# Patient Record
Sex: Male | Born: 1940 | Race: White | Hispanic: No | Marital: Married | Smoking: Former smoker
Health system: Southern US, Community
[De-identification: ages and names within clinical notes are randomized; demographics above are authoritative.]

## PROBLEM LIST (undated history)

## (undated) DIAGNOSIS — IMO0001 Reserved for inherently not codable concepts without codable children: Secondary | ICD-10-CM

## (undated) DIAGNOSIS — N138 Other obstructive and reflux uropathy: Secondary | ICD-10-CM

## (undated) DIAGNOSIS — C4490 Unspecified malignant neoplasm of skin, unspecified: Secondary | ICD-10-CM

## (undated) DIAGNOSIS — K219 Gastro-esophageal reflux disease without esophagitis: Secondary | ICD-10-CM

## (undated) DIAGNOSIS — M545 Low back pain, unspecified: Secondary | ICD-10-CM

## (undated) DIAGNOSIS — M1990 Unspecified osteoarthritis, unspecified site: Secondary | ICD-10-CM

## (undated) DIAGNOSIS — E785 Hyperlipidemia, unspecified: Secondary | ICD-10-CM

## (undated) DIAGNOSIS — E786 Lipoprotein deficiency: Secondary | ICD-10-CM

## (undated) DIAGNOSIS — I10 Essential (primary) hypertension: Secondary | ICD-10-CM

## (undated) DIAGNOSIS — N401 Enlarged prostate with lower urinary tract symptoms: Secondary | ICD-10-CM

## (undated) DIAGNOSIS — I2510 Atherosclerotic heart disease of native coronary artery without angina pectoris: Secondary | ICD-10-CM

## (undated) DIAGNOSIS — R350 Frequency of micturition: Secondary | ICD-10-CM

## (undated) HISTORY — PX: TONSILLECTOMY: SUR1361

## (undated) HISTORY — DX: Lipoprotein deficiency: E78.6

## (undated) HISTORY — DX: Essential (primary) hypertension: I10

## (undated) HISTORY — DX: Low back pain, unspecified: M54.50

## (undated) HISTORY — PX: CARDIAC CATHETERIZATION: SHX172

## (undated) HISTORY — PX: COLONOSCOPY W/ POLYPECTOMY: SHX1380

## (undated) HISTORY — DX: Hyperlipidemia, unspecified: E78.5

## (undated) HISTORY — DX: Low back pain: M54.5

## (undated) HISTORY — DX: Atherosclerotic heart disease of native coronary artery without angina pectoris: I25.10

## (undated) HISTORY — PX: EYE SURGERY: SHX253

## (undated) HISTORY — PX: BACK SURGERY: SHX140

## (undated) HISTORY — DX: Other obstructive and reflux uropathy: N13.8

## (undated) HISTORY — DX: Gastro-esophageal reflux disease without esophagitis: K21.9

## (undated) HISTORY — DX: Benign prostatic hyperplasia with lower urinary tract symptoms: N40.1

---

## 2000-04-25 ENCOUNTER — Ambulatory Visit (HOSPITAL_COMMUNITY): Admission: RE | Admit: 2000-04-25 | Discharge: 2000-04-25 | Payer: Self-pay | Admitting: Family Medicine

## 2000-04-25 ENCOUNTER — Encounter: Payer: Self-pay | Admitting: Family Medicine

## 2004-12-09 ENCOUNTER — Ambulatory Visit (HOSPITAL_COMMUNITY): Admission: RE | Admit: 2004-12-09 | Discharge: 2004-12-09 | Payer: Self-pay | Admitting: Gastroenterology

## 2004-12-09 ENCOUNTER — Encounter (INDEPENDENT_AMBULATORY_CARE_PROVIDER_SITE_OTHER): Payer: Self-pay | Admitting: Specialist

## 2006-11-28 ENCOUNTER — Ambulatory Visit: Payer: Self-pay | Admitting: Cardiology

## 2006-11-28 ENCOUNTER — Observation Stay (HOSPITAL_COMMUNITY): Admission: AD | Admit: 2006-11-28 | Discharge: 2006-11-30 | Payer: Self-pay | Admitting: Cardiology

## 2006-12-19 ENCOUNTER — Ambulatory Visit: Payer: Self-pay | Admitting: Cardiology

## 2007-01-11 ENCOUNTER — Ambulatory Visit: Payer: Self-pay | Admitting: Cardiology

## 2007-01-11 LAB — CONVERTED CEMR LAB
ALT: 30 units/L (ref 0–40)
AST: 21 units/L (ref 0–37)
Albumin: 4.1 g/dL (ref 3.5–5.2)
Alkaline Phosphatase: 81 units/L (ref 39–117)
BUN: 16 mg/dL (ref 6–23)
Basophils Absolute: 0 10*3/uL (ref 0.0–0.1)
Basophils Relative: 0.5 % (ref 0.0–1.0)
Bilirubin, Direct: 0.2 mg/dL (ref 0.0–0.3)
CO2: 31 meq/L (ref 19–32)
Calcium: 9.2 mg/dL (ref 8.4–10.5)
Chloride: 99 meq/L (ref 96–112)
Cholesterol: 136 mg/dL (ref 0–200)
Creatinine, Ser: 1.2 mg/dL (ref 0.4–1.5)
Direct LDL: 69.9 mg/dL
Eosinophils Absolute: 0 10*3/uL (ref 0.0–0.6)
Eosinophils Relative: 1 % (ref 0.0–5.0)
GFR calc Af Amer: 78 mL/min
GFR calc non Af Amer: 65 mL/min
Glucose, Bld: 103 mg/dL — ABNORMAL HIGH (ref 70–99)
HCT: 44.8 % (ref 39.0–52.0)
HDL: 26.4 mg/dL — ABNORMAL LOW (ref >39.0)
Hemoglobin: 16 g/dL (ref 13.0–17.0)
Lymphocytes Relative: 32.7 % (ref 12.0–46.0)
MCHC: 35.7 g/dL (ref 30.0–36.0)
MCV: 92 fL (ref 78.0–100.0)
Monocytes Absolute: 0.4 10*3/uL (ref 0.2–0.7)
Monocytes Relative: 8 % (ref 3.0–11.0)
Neutro Abs: 2.6 10*3/uL (ref 1.4–7.7)
Neutrophils Relative %: 57.8 % (ref 43.0–77.0)
Platelets: 174 10*3/uL (ref 150–400)
Potassium: 4.4 meq/L (ref 3.5–5.1)
RBC: 4.87 M/uL (ref 4.22–5.81)
RDW: 11.8 % (ref 11.5–14.6)
Sodium: 136 meq/L (ref 135–145)
Total Bilirubin: 1.1 mg/dL (ref 0.3–1.2)
Total CHOL/HDL Ratio: 5.2
Total Protein: 6.7 g/dL (ref 6.0–8.3)
Triglycerides: 205 mg/dL (ref 0–149)
VLDL: 41 mg/dL — ABNORMAL HIGH (ref 0–40)
WBC: 4.5 10*3/uL (ref 4.5–10.5)

## 2008-09-04 ENCOUNTER — Ambulatory Visit: Payer: Self-pay | Admitting: Internal Medicine

## 2008-09-04 DIAGNOSIS — I119 Hypertensive heart disease without heart failure: Secondary | ICD-10-CM

## 2008-09-04 DIAGNOSIS — M545 Low back pain, unspecified: Secondary | ICD-10-CM | POA: Insufficient documentation

## 2008-09-04 DIAGNOSIS — M1990 Unspecified osteoarthritis, unspecified site: Secondary | ICD-10-CM

## 2008-09-04 HISTORY — DX: Hypertensive heart disease without heart failure: I11.9

## 2008-09-04 HISTORY — DX: Unspecified osteoarthritis, unspecified site: M19.90

## 2008-12-19 ENCOUNTER — Ambulatory Visit: Payer: Self-pay | Admitting: Internal Medicine

## 2008-12-19 LAB — CONVERTED CEMR LAB
ALT: 28 units/L (ref 0–53)
AST: 22 units/L (ref 0–37)
Albumin: 4.1 g/dL (ref 3.5–5.2)
Alkaline Phosphatase: 90 units/L (ref 39–117)
BUN: 30 mg/dL — ABNORMAL HIGH (ref 6–23)
Basophils Absolute: 0 10*3/uL (ref 0.0–0.1)
Basophils Relative: 0.2 % (ref 0.0–3.0)
Bilirubin Urine: NEGATIVE
Bilirubin, Direct: 0.2 mg/dL (ref 0.0–0.3)
CO2: 30 meq/L (ref 19–32)
Calcium: 9.1 mg/dL (ref 8.4–10.5)
Chloride: 102 meq/L (ref 96–112)
Cholesterol: 138 mg/dL (ref 0–200)
Creatinine, Ser: 1.2 mg/dL (ref 0.4–1.5)
Eosinophils Absolute: 0.1 10*3/uL (ref 0.0–0.7)
Eosinophils Relative: 1.5 % (ref 0.0–5.0)
GFR calc non Af Amer: 64.14 mL/min (ref >60)
Glucose, Bld: 109 mg/dL — ABNORMAL HIGH (ref 70–99)
HCT: 48 % (ref 39.0–52.0)
HDL: 24.6 mg/dL — ABNORMAL LOW (ref >39.00)
Hemoglobin, Urine: NEGATIVE
Hemoglobin: 17 g/dL (ref 13.0–17.0)
Ketones, ur: NEGATIVE mg/dL
LDL Cholesterol: 81 mg/dL (ref 0–99)
Leukocytes, UA: NEGATIVE
Lymphocytes Relative: 26 % (ref 12.0–46.0)
Lymphs Abs: 1.2 10*3/uL (ref 0.7–4.0)
MCHC: 35.4 g/dL (ref 30.0–36.0)
MCV: 92.8 fL (ref 78.0–100.0)
Monocytes Absolute: 0.4 10*3/uL (ref 0.1–1.0)
Monocytes Relative: 8.1 % (ref 3.0–12.0)
Neutro Abs: 2.9 10*3/uL (ref 1.4–7.7)
Neutrophils Relative %: 64.2 % (ref 43.0–77.0)
Nitrite: NEGATIVE
PSA: 0.67 ng/mL (ref 0.10–4.00)
Platelets: 138 10*3/uL — ABNORMAL LOW (ref 150.0–400.0)
Potassium: 4.4 meq/L (ref 3.5–5.1)
RBC: 5.18 M/uL (ref 4.22–5.81)
RDW: 12.1 % (ref 11.5–14.6)
Sodium: 137 meq/L (ref 135–145)
Specific Gravity, Urine: 1.01 (ref 1.000–1.030)
TSH: 1.26 microintl units/mL (ref 0.35–5.50)
Total Bilirubin: 0.9 mg/dL (ref 0.3–1.2)
Total CHOL/HDL Ratio: 6
Total Protein, Urine: NEGATIVE mg/dL
Total Protein: 7.1 g/dL (ref 6.0–8.3)
Triglycerides: 164 mg/dL — ABNORMAL HIGH (ref 0.0–149.0)
Urine Glucose: NEGATIVE mg/dL
Urobilinogen, UA: 0.2 (ref 0.0–1.0)
VLDL: 32.8 mg/dL (ref 0.0–40.0)
WBC: 4.6 10*3/uL (ref 4.5–10.5)
pH: 7 (ref 5.0–8.0)

## 2008-12-20 ENCOUNTER — Encounter: Payer: Self-pay | Admitting: Internal Medicine

## 2008-12-26 ENCOUNTER — Ambulatory Visit: Payer: Self-pay | Admitting: Internal Medicine

## 2008-12-26 DIAGNOSIS — I2510 Atherosclerotic heart disease of native coronary artery without angina pectoris: Secondary | ICD-10-CM

## 2008-12-26 DIAGNOSIS — E786 Lipoprotein deficiency: Secondary | ICD-10-CM | POA: Insufficient documentation

## 2008-12-26 DIAGNOSIS — N401 Enlarged prostate with lower urinary tract symptoms: Secondary | ICD-10-CM

## 2008-12-26 HISTORY — DX: Atherosclerotic heart disease of native coronary artery without angina pectoris: I25.10

## 2008-12-30 DIAGNOSIS — E785 Hyperlipidemia, unspecified: Secondary | ICD-10-CM | POA: Insufficient documentation

## 2008-12-30 DIAGNOSIS — K219 Gastro-esophageal reflux disease without esophagitis: Secondary | ICD-10-CM

## 2008-12-30 HISTORY — DX: Gastro-esophageal reflux disease without esophagitis: K21.9

## 2008-12-30 HISTORY — DX: Hyperlipidemia, unspecified: E78.5

## 2009-01-22 ENCOUNTER — Encounter (INDEPENDENT_AMBULATORY_CARE_PROVIDER_SITE_OTHER): Payer: Self-pay | Admitting: *Deleted

## 2009-01-27 ENCOUNTER — Ambulatory Visit: Payer: Self-pay | Admitting: Internal Medicine

## 2009-01-27 DIAGNOSIS — E8881 Metabolic syndrome: Secondary | ICD-10-CM

## 2009-01-27 LAB — CONVERTED CEMR LAB
Cholesterol, target level: 200 mg/dL
HDL goal, serum: 40 mg/dL
LDL Goal: 70 mg/dL

## 2009-02-28 ENCOUNTER — Ambulatory Visit: Payer: Self-pay | Admitting: Cardiology

## 2009-03-24 ENCOUNTER — Encounter: Payer: Self-pay | Admitting: Cardiovascular Disease

## 2009-03-24 ENCOUNTER — Ambulatory Visit: Payer: Self-pay

## 2009-06-10 ENCOUNTER — Encounter: Payer: Self-pay | Admitting: Internal Medicine

## 2009-06-10 ENCOUNTER — Ambulatory Visit: Payer: Self-pay | Admitting: Internal Medicine

## 2009-06-19 ENCOUNTER — Ambulatory Visit: Payer: Self-pay | Admitting: Internal Medicine

## 2009-06-20 ENCOUNTER — Encounter: Payer: Self-pay | Admitting: Internal Medicine

## 2009-07-23 ENCOUNTER — Ambulatory Visit: Payer: Self-pay | Admitting: Internal Medicine

## 2009-07-23 DIAGNOSIS — J45901 Unspecified asthma with (acute) exacerbation: Secondary | ICD-10-CM | POA: Insufficient documentation

## 2009-07-23 LAB — CONVERTED CEMR LAB
Cholesterol, target level: 200 mg/dL
HDL goal, serum: 40 mg/dL
LDL Goal: 100 mg/dL

## 2009-08-19 ENCOUNTER — Ambulatory Visit: Payer: Self-pay | Admitting: Internal Medicine

## 2010-01-08 ENCOUNTER — Ambulatory Visit: Payer: Self-pay | Admitting: Internal Medicine

## 2010-01-08 DIAGNOSIS — F528 Other sexual dysfunction not due to a substance or known physiological condition: Secondary | ICD-10-CM

## 2010-01-08 LAB — CONVERTED CEMR LAB
ALT: 25 units/L (ref 0–53)
AST: 25 units/L (ref 0–37)
Albumin: 4.3 g/dL (ref 3.5–5.2)
Alkaline Phosphatase: 87 units/L (ref 39–117)
BUN: 18 mg/dL (ref 6–23)
Basophils Absolute: 0 10*3/uL (ref 0.0–0.1)
Basophils Relative: 0.7 % (ref 0.0–3.0)
Bilirubin Urine: NEGATIVE
Bilirubin, Direct: 0.2 mg/dL (ref 0.0–0.3)
CO2: 26 meq/L (ref 19–32)
Calcium: 9.3 mg/dL (ref 8.4–10.5)
Chloride: 100 meq/L (ref 96–112)
Cholesterol: 102 mg/dL (ref 0–200)
Creatinine, Ser: 1 mg/dL (ref 0.4–1.5)
Eosinophils Absolute: 0 10*3/uL (ref 0.0–0.7)
Eosinophils Relative: 1 % (ref 0.0–5.0)
GFR calc non Af Amer: 76.26 mL/min (ref >60)
Glucose, Bld: 101 mg/dL — ABNORMAL HIGH (ref 70–99)
HCT: 45.7 % (ref 39.0–52.0)
HDL: 27.3 mg/dL — ABNORMAL LOW (ref >39.00)
Hemoglobin, Urine: NEGATIVE
Hemoglobin: 16.1 g/dL (ref 13.0–17.0)
Hgb A1c MFr Bld: 5.7 % (ref 4.6–6.5)
Ketones, ur: NEGATIVE mg/dL
LDL Cholesterol: 45 mg/dL (ref 0–99)
Leukocytes, UA: NEGATIVE
Lymphocytes Relative: 38 % (ref 12.0–46.0)
Lymphs Abs: 1.6 10*3/uL (ref 0.7–4.0)
MCHC: 35.2 g/dL (ref 30.0–36.0)
MCV: 93.6 fL (ref 78.0–100.0)
Monocytes Absolute: 0.3 10*3/uL (ref 0.1–1.0)
Monocytes Relative: 7.7 % (ref 3.0–12.0)
Neutro Abs: 2.3 10*3/uL (ref 1.4–7.7)
Neutrophils Relative %: 52.6 % (ref 43.0–77.0)
Nitrite: NEGATIVE
PSA: 0.79 ng/mL (ref 0.10–4.00)
Platelets: 146 10*3/uL — ABNORMAL LOW (ref 150.0–400.0)
Potassium: 4.3 meq/L (ref 3.5–5.1)
RBC: 4.88 M/uL (ref 4.22–5.81)
RDW: 12.9 % (ref 11.5–14.6)
Sodium: 139 meq/L (ref 135–145)
Specific Gravity, Urine: 1.015 (ref 1.000–1.030)
TSH: 1.62 microintl units/mL (ref 0.35–5.50)
Total Bilirubin: 1.3 mg/dL — ABNORMAL HIGH (ref 0.3–1.2)
Total CHOL/HDL Ratio: 4
Total Protein, Urine: NEGATIVE mg/dL
Total Protein: 6.9 g/dL (ref 6.0–8.3)
Triglycerides: 149 mg/dL (ref 0.0–149.0)
Urine Glucose: NEGATIVE mg/dL
Urobilinogen, UA: 0.2 (ref 0.0–1.0)
VLDL: 29.8 mg/dL (ref 0.0–40.0)
WBC: 4.3 10*3/uL — ABNORMAL LOW (ref 4.5–10.5)
pH: 6.5 (ref 5.0–8.0)

## 2010-01-09 ENCOUNTER — Encounter: Payer: Self-pay | Admitting: Internal Medicine

## 2010-04-06 ENCOUNTER — Ambulatory Visit: Payer: Self-pay | Admitting: Internal Medicine

## 2010-04-06 ENCOUNTER — Encounter: Payer: Self-pay | Admitting: Internal Medicine

## 2010-04-06 ENCOUNTER — Telehealth: Payer: Self-pay | Admitting: Internal Medicine

## 2010-04-06 LAB — CONVERTED CEMR LAB
AST: 26 units/L (ref 0–37)
Albumin: 3.9 g/dL (ref 3.5–5.2)
BUN: 15 mg/dL (ref 6–23)
Basophils Absolute: 0 10*3/uL (ref 0.0–0.1)
Basophils Relative: 0.2 % (ref 0.0–3.0)
Calcium: 9.3 mg/dL (ref 8.4–10.5)
Creatinine, Ser: 1.1 mg/dL (ref 0.4–1.5)
Eosinophils Absolute: 0.1 10*3/uL (ref 0.0–0.7)
GFR calc non Af Amer: 74.53 mL/min (ref >60)
Hemoglobin: 15.3 g/dL (ref 13.0–17.0)
Lymphs Abs: 1.7 10*3/uL (ref 0.7–4.0)
MCHC: 34.9 g/dL (ref 30.0–36.0)
MCV: 94.1 fL (ref 78.0–100.0)
Monocytes Absolute: 0.4 10*3/uL (ref 0.1–1.0)
Neutro Abs: 3.6 10*3/uL (ref 1.4–7.7)
RBC: 4.66 M/uL (ref 4.22–5.81)
RDW: 12.7 % (ref 11.5–14.6)
TSH: 1.54 microintl units/mL (ref 0.35–5.50)
Total Bilirubin: 1 mg/dL (ref 0.3–1.2)

## 2010-06-24 ENCOUNTER — Encounter: Payer: Self-pay | Admitting: Internal Medicine

## 2010-07-08 ENCOUNTER — Telehealth: Payer: Self-pay | Admitting: Internal Medicine

## 2010-09-15 NOTE — Assessment & Plan Note (Signed)
Summary: cough/cold not improving/cd   Vital Signs:  Patient profile:   70 year old male Height:      74 inches Weight:      250.75 pounds BMI:     32.31 O2 Sat:      95 % on Room air Temp:     98.2 degrees F oral Pulse rate:   70 / minute Pulse rhythm:   regular Resp:     16 per minute BP sitting:   138 / 70  (left arm) Cuff size:   large  Vitals Entered By: Rock Nephew CMA (April 06, 2010 10:13 AM)  Nutrition Counseling: Patient's BMI is greater than 25 and therefore counseled on weight management options.  O2 Flow:  Room air CC: follow-up visit, Cough Is Patient Diabetic? No Pain Assessment Patient in pain? no        Primary Care Provider:  Etta Grandchild MD  CC:  follow-up visit and Cough.  History of Present Illness:  Cough      This is a 70 year old man who presents with Cough.  The symptoms began 3 weeks ago.  The intensity is described as moderate.  The patient reports productive cough, but denies pleuritic chest pain, shortness of breath, wheezing, exertional dyspnea, fever, hemoptysis, and malaise.  The patient denies the following symptoms: cold/URI symptoms, sore throat, nasal congestion, chronic rhinitis, weight loss, acid reflux symptoms, and peripheral edema.  The cough is worse with lying down.  Diagnostic testing to date has included CXR and spirometry.    Preventive Screening-Counseling & Management  Alcohol-Tobacco     Alcohol drinks/day: 0     Smoking Status: quit < 6 months     Smoking Cessation Counseling: no     Smoke Cessation Stage: quit     Packs/Day: 0.5     Passive Smoke Exposure: no     Tobacco Counseling: to remain off tobacco products  Hep-HIV-STD-Contraception     Hepatitis Risk: no risk noted     Hepatitis Risk Counseling: to avoid increased hepatitis risk     HIV Risk: no risk noted     HIV Risk Counseling: to avoid increased HIV risk     STD Risk: no risk noted     STD Risk Counseling: to avoid increased STD risk  TSE monthly: yes     Testicular SE Education/Counseling to perform regular STE      Sexual History:  currently monogamous.        Drug Use:  never.        Blood Transfusions:  no.    Clinical Review Panels:  Lipid Management   Cholesterol:  102 (01/08/2010)   LDL (bad choesterol):  45 (01/08/2010)   HDL (good cholesterol):  27.30 (01/08/2010)  Diabetes Management   HgBA1C:  5.7 (01/08/2010)   Creatinine:  1.0 (01/08/2010)   Last Flu Vaccine:  given (06/04/2009)  CBC   WBC:  4.3 (01/08/2010)   RBC:  4.88 (01/08/2010)   Hgb:  16.1 (01/08/2010)   Hct:  45.7 (01/08/2010)   Platelets:  146.0 (01/08/2010)   MCV  93.6 (01/08/2010)   MCHC  35.2 (01/08/2010)   RDW  12.9 (01/08/2010)   PMN:  52.6 (01/08/2010)   Lymphs:  38.0 (01/08/2010)   Monos:  7.7 (01/08/2010)   Eosinophils:  1.0 (01/08/2010)   Basophil:  0.7 (01/08/2010)  Complete Metabolic Panel   Glucose:  101 (01/08/2010)   Sodium:  139 (01/08/2010)   Potassium:  4.3 (01/08/2010)  Chloride:  100 (01/08/2010)   CO2:  26 (01/08/2010)   BUN:  18 (01/08/2010)   Creatinine:  1.0 (01/08/2010)   Albumin:  4.3 (01/08/2010)   Total Protein:  6.9 (01/08/2010)   Calcium:  9.3 (01/08/2010)   Total Bili:  1.3 (01/08/2010)   Alk Phos:  87 (01/08/2010)   SGPT (ALT):  25 (01/08/2010)   SGOT (AST):  25 (01/08/2010)   Medications Prior to Update: 1)  Metoprolol Tartrate 25 Mg Tabs (Metoprolol Tartrate) .... Take 1 Tablet By Mouth Two Times A Day 2)  Meloxicam 7.5 Mg Tabs (Meloxicam) .... Take 1 Tablet By Mouth Two Times A Day 3)  Vitamin E 400 Unit Caps (Vitamin E) 4)  Super B Complex/vitamin C  Tabs (B Complex-C) 5)  Co Q-10 200 Mg Caps (Coenzyme Q10) 6)  Aspirin 81 Mg Tbec (Aspirin) .... Take One Tablet By Mouth Daily 7)  Fish Oil 1000 Mg Caps (Omega-3 Fatty Acids) .... Take 1 Capsule By Mouth Two Times A Day 8)  Simvastatin 40 Mg Tabs (Simvastatin) .... One By Mouth Once Daily For Cholesterol, This Replaces Crestor 9)   Hyzaar 100-12.5 Mg Tabs (Losartan Potassium-Hctz) .... Once Daily 10)  Rapaflo 8 Mg Caps (Silodosin) .... One By Mouth Once Daily To Help Urine Flow 11)  Viagra 100 Mg Tabs (Sildenafil Citrate) .... One By Mouth Once Daily As Directed  Current Medications (verified): 1)  Metoprolol Tartrate 25 Mg Tabs (Metoprolol Tartrate) .... Take 1 Tablet By Mouth Two Times A Day 2)  Meloxicam 7.5 Mg Tabs (Meloxicam) .... Take 1 Tablet By Mouth Two Times A Day 3)  Vitamin E 400 Unit Caps (Vitamin E) 4)  Super B Complex/vitamin C  Tabs (B Complex-C) 5)  Co Q-10 200 Mg Caps (Coenzyme Q10) 6)  Aspirin 81 Mg Tbec (Aspirin) .... Take One Tablet By Mouth Daily 7)  Fish Oil 1000 Mg Caps (Omega-3 Fatty Acids) .... Take 1 Capsule By Mouth Two Times A Day 8)  Simvastatin 40 Mg Tabs (Simvastatin) .... One By Mouth Once Daily For Cholesterol, This Replaces Crestor 9)  Hyzaar 100-12.5 Mg Tabs (Losartan Potassium-Hctz) .... Once Daily 10)  Rapaflo 8 Mg Caps (Silodosin) .... One By Mouth Once Daily To Help Urine Flow 11)  Viagra 100 Mg Tabs (Sildenafil Citrate) .... One By Mouth Once Daily As Directed 12)  Avelox 400 Mg Tabs (Moxifloxacin Hcl) .... One By Mouth Once Daily For 7 Days 13)  Symbicort 160-4.5 Mcg/act Aero (Budesonide-Formoterol Fumarate) .... 2 Puffs Two Times A Day 14)  Tussionex Pennkinetic Er 8-10 Mg/60ml Lqcr (Chlorpheniramine-Hydrocodone) .... 5 Ml By Mouth Two Times A Day As Needed For Cough  Allergies (verified): 1)  ! Keflex 2)  ! Indocin  Past History:  Past Medical History: Last updated: 02/28/2009 Current Problems:  HYPERTENSION (ICD-401.9) HYPERLIPIDEMIA (ICD-272.4) CHEST PAIN (ICD-786.50) LOW HDL (ICD-272.5) CORONARY ARTERY DISEASE (ICD-414.00) HYPERTROPHY PROSTATE W/UR OBST & OTH LUTS (ICD-600.01) SPECIAL SCREENING MALIGNANT NEOPLASM OF PROSTATE (ICD-V76.44) ROUTINE GENERAL MEDICAL EXAM@HEALTH  CARE FACL (ICD-V70.0) OSTEOARTHRITIS (ICD-715.90) LOW BACK PAIN (ICD-724.2) GERD  (ICD-530.81) TOBACCO USE (ICD-305.1)  Past Surgical History: Last updated: 12/30/2008  Colonoscopy with biopsies.  Family History: Last updated: 09/04/2008 Family History of Alcoholism/Addiction Family History of Arthritis Family History Hypertension  Social History: Last updated: 08/19/2009 Married Alcohol use-no Drug use-no Regular exercise-yes Tobacco Use - Yes.  Retired  Risk Factors: Alcohol Use: 0 (04/06/2010) Exercise: yes (09/04/2008)  Risk Factors: Smoking Status: quit < 6 months (04/06/2010) Packs/Day: 0.5 (04/06/2010) Passive Smoke Exposure: no (  04/06/2010)  Family History: Reviewed history from 09/04/2008 and no changes required. Family History of Alcoholism/Addiction Family History of Arthritis Family History Hypertension  Social History: Reviewed history from 08/19/2009 and no changes required. Married Alcohol use-no Drug use-no Regular exercise-yes Tobacco Use - Yes.  Retired  Review of Systems       The patient complains of prolonged cough.  The patient denies anorexia, fever, weight loss, weight gain, hoarseness, chest pain, syncope, dyspnea on exertion, peripheral edema, headaches, hemoptysis, abdominal pain, hematuria, suspicious skin lesions, difficulty walking, depression, enlarged lymph nodes, and angioedema.   General:  Complains of fatigue, malaise, and sweats; denies chills, fever, loss of appetite, sleep disorder, weakness, and weight loss.  Physical Exam  General:  well-developed well-nourished in no acute distress. Skin warm and dry. Head:  normocephalic and atraumatic.   Ears:  R ear normal and L ear normal.   Nose:  External nasal examination shows no deformity or inflammation. Nasal mucosa are pink and moist without lesions or exudates. Mouth:  Oral mucosa and oropharynx without lesions or exudates.  Teeth in good repair. Neck:  supple, full ROM, no masses, no thyromegaly, no thyroid nodules or tenderness, no JVD, normal carotid  upstroke, no carotid bruits, no cervical lymphadenopathy, and no neck tenderness.   Lungs:  R wheezes and L wheezes but good air movement and some rhonchi that clear with cough, no decreased breath sounds, no E to A changes Heart:  normal rate, regular rhythm, no murmur, no gallop, no rub, and no JVD.   Abdomen:  soft, non-tender, normal bowel sounds, no distention, no masses, no guarding, no rigidity, no rebound tenderness, no abdominal hernia, no inguinal hernia, no hepatomegaly, and no splenomegaly.   Msk:  No deformity or scoliosis noted of thoracic or lumbar spine.   Pulses:  R and L carotid,radial,femoral,dorsalis pedis and posterior tibial pulses are full and equal bilaterally Extremities:  No clubbing, cyanosis, edema, or deformity noted with normal full range of motion of all joints.   Neurologic:  No cranial nerve deficits noted. Station and gait are normal. Plantar reflexes are down-going bilaterally. DTRs are symmetrical throughout. Sensory, motor and coordinative functions appear intact. Skin:  turgor normal, color normal, no rashes, no suspicious lesions, no ecchymoses, no petechiae, no purpura, no ulcerations, and no edema.   Cervical Nodes:  no anterior cervical adenopathy and no posterior cervical adenopathy.   Axillary Nodes:  no R axillary adenopathy and no L axillary adenopathy.   Inguinal Nodes:  no R inguinal adenopathy and no L inguinal adenopathy.   Psych:  Cognition and judgment appear intact. Alert and cooperative with normal attention span and concentration. No apparent delusions, illusions, hallucinations   Impression & Recommendations:  Problem # 1:  COUGH (ICD-786.2) Assessment New  Orders: T-2 View CXR (71020TC)  Problem # 2:  BRONCHITIS-ACUTE (ICD-466.0) Assessment: New  His updated medication list for this problem includes:    Avelox 400 Mg Tabs (Moxifloxacin hcl) ..... One by mouth once daily for 7 days    Symbicort 160-4.5 Mcg/act Aero  (Budesonide-formoterol fumarate) .Marland Kitchen... 2 puffs two times a day    Tussionex Pennkinetic Er 8-10 Mg/73ml Lqcr (Chlorpheniramine-hydrocodone) .Marland KitchenMarland KitchenMarland KitchenMarland Kitchen 5 ml by mouth two times a day as needed for cough  Problem # 3:  ASTHMA UNSPECIFIED WITH EXACERBATION (CZY-606.30) Assessment: Deteriorated  His updated medication list for this problem includes:    Symbicort 160-4.5 Mcg/act Aero (Budesonide-formoterol fumarate) .Marland Kitchen... 2 puffs two times a day  Problem # 4:  HYPERTENSION (  ICD-401.9) Assessment: Improved  His updated medication list for this problem includes:    Metoprolol Tartrate 25 Mg Tabs (Metoprolol tartrate) .Marland Kitchen... Take 1 tablet by mouth two times a day    Hyzaar 100-12.5 Mg Tabs (Losartan potassium-hctz) ..... Once daily  Orders: Venipuncture (60454) TLB-CBC Platelet - w/Differential (85025-CBCD) TLB-BMP (Basic Metabolic Panel-BMET) (80048-METABOL) TLB-TSH (Thyroid Stimulating Hormone) (84443-TSH) TLB-Hepatic/Liver Function Pnl (80076-HEPATIC)  BP today: 138/70 Prior BP: 148/88 (01/08/2010)  Prior 10 Yr Risk Heart Disease: N/A (12/26/2008)  Labs Reviewed: K+: 4.3 (01/08/2010) Creat: : 1.0 (01/08/2010)   Chol: 102 (01/08/2010)   HDL: 27.30 (01/08/2010)   LDL: 45 (01/08/2010)   TG: 149.0 (01/08/2010)  Complete Medication List: 1)  Metoprolol Tartrate 25 Mg Tabs (Metoprolol tartrate) .... Take 1 tablet by mouth two times a day 2)  Meloxicam 7.5 Mg Tabs (Meloxicam) .... Take 1 tablet by mouth two times a day 3)  Vitamin E 400 Unit Caps (Vitamin e) 4)  Super B Complex/vitamin C Tabs (B complex-c) 5)  Co Q-10 200 Mg Caps (Coenzyme q10) 6)  Aspirin 81 Mg Tbec (Aspirin) .... Take one tablet by mouth daily 7)  Fish Oil 1000 Mg Caps (Omega-3 fatty acids) .... Take 1 capsule by mouth two times a day 8)  Simvastatin 40 Mg Tabs (Simvastatin) .... One by mouth once daily for cholesterol, this replaces crestor 9)  Hyzaar 100-12.5 Mg Tabs (Losartan potassium-hctz) .... Once daily 10)  Rapaflo 8  Mg Caps (Silodosin) .... One by mouth once daily to help urine flow 11)  Viagra 100 Mg Tabs (Sildenafil citrate) .... One by mouth once daily as directed 12)  Avelox 400 Mg Tabs (Moxifloxacin hcl) .... One by mouth once daily for 7 days 13)  Symbicort 160-4.5 Mcg/act Aero (Budesonide-formoterol fumarate) .... 2 puffs two times a day 14)  Tussionex Pennkinetic Er 8-10 Mg/55ml Lqcr (Chlorpheniramine-hydrocodone) .... 5 ml by mouth two times a day as needed for cough  Patient Instructions: 1)  Please schedule a follow-up appointment in 2 weeks. 2)  It is important that you exercise regularly at least 20 minutes 5 times a week. If you develop chest pain, have severe difficulty breathing, or feel very tired , stop exercising immediately and seek medical attention. 3)  You need to lose weight. Consider a lower calorie diet and regular exercise.  4)  Take your antibiotic as prescribed until ALL of it is gone, but stop if you develop a rash or swelling and contact our office as soon as possible. 5)  Acute bronchitis symptoms for less than 10 days are not helped by antibiotics. take over the counter cough medications. call if no improvment in  5-7 days, sooner if increasing cough, fever, or new symptoms( shortness of breath, chest pain). Prescriptions: TUSSIONEX PENNKINETIC ER 8-10 MG/5ML LQCR (CHLORPHENIRAMINE-HYDROCODONE) 5 ml by mouth two times a day as needed for cough  #4 ounces x 0   Entered and Authorized by:   Etta Grandchild MD   Signed by:   Etta Grandchild MD on 04/06/2010   Method used:   Print then Give to Patient   RxID:   [id number] SYMBICORT 160-4.5 MCG/ACT AERO (BUDESONIDE-FORMOTEROL FUMARATE) 2 puffs two times a day  #4 inhs x 0   Entered and Authorized by:   Etta Grandchild MD   Signed by:   Etta Grandchild MD on 04/06/2010   Method used:   Samples Given   RxID:   [id number] AVELOX 400 MG TABS (MOXIFLOXACIN HCL) One by  mouth once daily for 7 days  #7 x 0   Entered and  Authorized by:   Etta Grandchild MD   Signed by:   Etta Grandchild MD on 04/06/2010   Method used:   Samples Given   RxID:   [id number]

## 2010-09-15 NOTE — Letter (Signed)
Summary: Results Follow-up Letter  [facility]  [address] [place], [place] [address]   Phone: [phone]  Fax: [phone]    01/09/2010  [address] [place], [place]  [address]  Dear [name],   The following are the results of your recent test(s):  Test     Result     CBC       normal Liver/kidney   normal Blood sugars   normal Prostate     normal Urine       normal Thyroid     normal   _________________________________________________________  Please call for an appointment as directed _________________________________________________________ _________________________________________________________ _________________________________________________________  Sincerely,  Sanda Linger MD [facility]

## 2010-09-15 NOTE — Letter (Signed)
Summary: Lipid Letter  [facility]  [address] [place], [place] [address]   Phone: [phone]  Fax: [phone]    01/09/2010  Jack Farmer [address] [place], [place]  [address]  Dear Jack Farmer:  We have carefully reviewed your last lipid profile from 01/08/2010 and the results are noted below with a summary of recommendations for lipid management.    Cholesterol:       102     Goal: <200   HDL "good" Cholesterol:   62.95     Goal: >40   LDL "bad" Cholesterol:   45     Goal: <100   Triglycerides:       149.0     Goal: <150        TLC Diet (Therapeutic Lifestyle Change): Saturated Fats & Transfatty acids should be kept < 7% of total calories ***Reduce Saturated Fats Polyunstaurated Fat can be up to 10% of total calories Monounsaturated Fat Fat can be up to 20% of total calories Total Fat should be no greater than 25-35% of total calories Carbohydrates should be 50-60% of total calories Protein should be approximately 15% of total calories Fiber should be at least 20-30 grams a day ***Increased fiber may help lower LDL Total Cholesterol should be < 200mg /day Consider adding plant stanol/sterols to diet (example: Benacol spread) ***A higher intake of unsaturated fat may reduce Triglycerides and Increase HDL    Adjunctive Measures (may lower LIPIDS and reduce risk of Heart Attack) include: Aerobic Exercise (20-30 minutes 3-4 times a week) Limit Alcohol Consumption Weight Reduction Aspirin 75-81 mg a day by mouth (if not allergic or contraindicated) Dietary Fiber 20-30 grams a day by mouth     Current Medications: 1)    Metoprolol Tartrate 25 Mg Tabs (Metoprolol tartrate) .... Take 1 tablet by mouth two times a day 2)    Meloxicam 7.5 Mg Tabs (Meloxicam) .... Take 1 tablet by mouth two times a day 3)    Vitamin E 400 Unit Caps (Vitamin e) 4)    Super B Complex/vitamin C  Tabs (B complex-c) 5)    Co Q-10 200 Mg Caps (Coenzyme q10) 6)    Aspirin 81 Mg  Tbec (Aspirin) .... Take one tablet by mouth daily 7)    Fish Oil 1000 Mg Caps (Omega-3 fatty acids) .... Take 1 capsule by mouth two times a day 8)    Simvastatin 40 Mg Tabs (Simvastatin) .... One by mouth once daily for cholesterol, this replaces crestor 9)    Hyzaar 100-12.5 Mg Tabs (Losartan potassium-hctz) .... Once daily 10)    Rapaflo 8 Mg Caps (Silodosin) .... One by mouth once daily to help urine flow 11)    Viagra 100 Mg Tabs (Sildenafil citrate) .... One by mouth once daily as directed  If you have any questions, please call. We appreciate being able to work with you.   Sincerely,    [facility] Etta Grandchild MD

## 2010-09-15 NOTE — Assessment & Plan Note (Signed)
Summary: f/u appt/#/cd   Vital Signs:  Patient profile:   70 year old male Height:      74 inches Weight:      242.75 pounds BMI:     31.28 O2 Sat:      97 % on Room air Temp:     97.9 degrees F oral Pulse rate:   72 / minute Pulse rhythm:   regular Resp:     16 per minute BP sitting:   168 / 78  (left arm) Cuff size:   large  Vitals Entered By: Rock Nephew CMA (August 19, 2009 9:14 AM)  Nutrition Counseling: Patient's BMI is greater than 25 and therefore counseled on weight management options.  O2 Flow:  Room air CC: follow-up visit   Primary Care Provider:  Etta Grandchild MD  CC:  follow-up visit.  History of Present Illness: He returns for f/up and continues to have a cough that is productive of brown phlegm. His pharmacist told him the Hyzaar might be causing the cough so he stopped Hyzaar and the cough did not improve. He did not start the Advair until one day prior to this visit. His BP has risen since the d'c of Hyzaar.  Asthma History    Initial Asthma Severity Rating:    Age range: 12+ years    Symptoms: daily    Nighttime Awakenings: 0-2/month    Interferes w/ normal activity: minor limitations    SABA use (not for EIB): 0-2 days/week    Asthma Severity Assessment: Moderate Persistent   Preventive Screening-Counseling & Management  Alcohol-Tobacco     Alcohol drinks/day: 0     Smoking Status: quit < 6 months     Smoking Cessation Counseling: no     Smoke Cessation Stage: quit  Hep-HIV-STD-Contraception     Hepatitis Risk: no risk noted     Hepatitis Risk Counseling: to avoid increased hepatitis risk     HIV Risk: no risk noted     HIV Risk Counseling: to avoid increased HIV risk     STD Risk: no risk noted     STD Risk Counseling: to avoid increased STD risk     TSE monthly: yes     Testicular SE Education/Counseling to perform regular STE  Current Medications (verified): 1)  Metoprolol Tartrate 25 Mg Tabs (Metoprolol Tartrate) .... Take 1  Tablet By Mouth Two Times A Day 2)  Meloxicam 7.5 Mg Tabs (Meloxicam) .... Take 1 Tablet By Mouth Two Times A Day 3)  Vitamin E 400 Unit Caps (Vitamin E) 4)  Super B Complex/vitamin C  Tabs (B Complex-C) 5)  Co Q-10 200 Mg Caps (Coenzyme Q10) 6)  Aspirin 81 Mg Tbec (Aspirin) .... Take One Tablet By Mouth Daily 7)  Fish Oil 1000 Mg Caps (Omega-3 Fatty Acids) .... Take 1 Capsule By Mouth Two Times A Day 8)  Chantix Continuing Month Pak 1 Mg Tabs (Varenicline Tartrate) .... One By Mouth Bid 9)  Simvastatin 40 Mg Tabs (Simvastatin) .... One By Mouth Once Daily For Cholesterol, This Replaces Crestor 10)  Advair Diskus 100-50 Mcg/dose Aepb (Fluticasone-Salmeterol) .... One Puff Two Times A Day For Asthma  Allergies (verified): 1)  ! Keflex 2)  ! Indocin  Past History:  Past Medical History: Reviewed history from 02/28/2009 and no changes required. Current Problems:  HYPERTENSION (ICD-401.9) HYPERLIPIDEMIA (ICD-272.4) CHEST PAIN (ICD-786.50) LOW HDL (ICD-272.5) CORONARY ARTERY DISEASE (ICD-414.00) HYPERTROPHY PROSTATE W/UR OBST & OTH LUTS (ICD-600.01) SPECIAL SCREENING MALIGNANT NEOPLASM OF  PROSTATE (ICD-V76.44) ROUTINE GENERAL MEDICAL EXAM@HEALTH  CARE FACL (ICD-V70.0) OSTEOARTHRITIS (ICD-715.90) LOW BACK PAIN (ICD-724.2) GERD (ICD-530.81) TOBACCO USE (ICD-305.1)  Past Surgical History: Reviewed history from 12/30/2008 and no changes required.  Colonoscopy with biopsies.  Family History: Reviewed history from 09/04/2008 and no changes required. Family History of Alcoholism/Addiction Family History of Arthritis Family History Hypertension  Social History: Reviewed history from 02/28/2009 and no changes required. Married Alcohol use-no Drug use-no Regular exercise-yes Tobacco Use - Yes.  Retired  Review of Systems       The patient complains of prolonged cough.  The patient denies anorexia, fever, weight loss, weight gain, chest pain, syncope, dyspnea on exertion,  peripheral edema, headaches, hemoptysis, abdominal pain, suspicious skin lesions, enlarged lymph nodes, and angioedema.    Physical Exam  General:  well-developed well-nourished in no acute distress. Skin warm and dry. Head:  normocephalic and atraumatic.   Ears:  R ear normal and L ear normal.   Nose:  no external deformity, no external erythema, no nasal discharge, no mucosal pallor, no mucosal edema, no airflow obstruction, no intranasal foreign body, no nasal polyps, no nasal mucosal lesions, no mucosal friability, no active bleeding or clots, no sinus percussion tenderness, and no septum abnormalities.   Mouth:  good dentition, no gingival abnormalities, no dental plaque, pharynx pink and moist, no exudates, no posterior lymphoid hypertrophy, no postnasal drip, no pharyngeal crowing, no lesions, no tongue abnormalities, no leukoplakia, no petechiae, and pharyngeal erythema.   Neck:  supple with no bruits. No thyromegaly. Lungs:  normal respiratory effort, no intercostal retractions, no accessory muscle use, normal breath sounds, no dullness, no fremitus, no crackles, and no wheezes.   Heart:  Normal rate and regular rhythm. S1 and S2 normal without gallop, murmur, click, rub or other extra sounds. Abdomen:  soft, non-tender, normal bowel sounds, no distention, no masses, no guarding, no rigidity, no rebound tenderness, no hepatomegaly, and no splenomegaly.   Msk:  normal ROM, no joint tenderness, no joint swelling, no joint warmth, and no redness over joints.   Pulses:  R and L carotid,radial,femoral,dorsalis pedis and posterior tibial pulses are full and equal bilaterally Extremities:  No clubbing, cyanosis, edema, or deformity noted with normal full range of motion of all joints.   Neurologic:  No cranial nerve deficits noted. Station and gait are normal. Plantar reflexes are down-going bilaterally. DTRs are symmetrical throughout. Sensory, motor and coordinative functions appear  intact. Skin:  turgor normal, color normal, no rashes, no suspicious lesions, no ecchymoses, no petechiae, no purpura, no ulcerations, and no edema.   Cervical Nodes:  No lymphadenopathy noted Axillary Nodes:  No palpable lymphadenopathy Psych:  Cognition and judgment appear intact. Alert and cooperative with normal attention span and concentration. No apparent delusions, illusions, hallucinations   Impression & Recommendations:  Problem # 1:  ASTHMA UNSPECIFIED WITH EXACERBATION (ICD-493.92) Assessment Unchanged  His updated medication list for this problem includes:    Advair Diskus 100-50 Mcg/dose Aepb (Fluticasone-salmeterol) ..... One puff two times a day for asthma  Problem # 2:  HYPERTENSION (ICD-401.9) Assessment: Deteriorated  The following medications were removed from the medication list:    Hyzaar 100-12.5 Mg Tabs (Losartan potassium-hctz) ..... One once daily for high blood pressure, this replaces diovanhct His updated medication list for this problem includes:    Metoprolol Tartrate 25 Mg Tabs (Metoprolol tartrate) .Marland Kitchen... Take 1 tablet by mouth two times a day    Hyzaar 100-12.5 Mg Tabs (Losartan potassium-hctz) ..... Once daily  Problem #  3:  HYPERLIPIDEMIA (ICD-272.4) Assessment: Unchanged  His updated medication list for this problem includes:    Simvastatin 40 Mg Tabs (Simvastatin) ..... One by mouth once daily for cholesterol, this replaces crestor  Labs Reviewed: SGOT: 22 (12/19/2008)   SGPT: 28 (12/19/2008)  Lipid Goals: Chol Goal: 200 (07/23/2009)   HDL Goal: 40 (07/23/2009)   LDL Goal: 100 (07/23/2009)   TG Goal: 150 (07/23/2009)  Prior 10 Yr Risk Heart Disease: N/A (12/26/2008)   HDL:24.60 (12/19/2008), 26.4 (01/11/2007)  LDL:81 (12/19/2008), DEL (16/05/9603)  Chol:138 (12/19/2008), 136 (01/11/2007)  Trig:164.0 (12/19/2008), 205 (01/11/2007)  Problem # 4:  TOBACCO USE (ICD-305.1) Assessment: Improved  His updated medication list for this problem  includes:    Chantix Continuing Month Pak 1 Mg Tabs (Varenicline tartrate) ..... One by mouth bid  Problem # 5:  BRONCHITIS-ACUTE (ICD-466.0) Assessment: New  His updated medication list for this problem includes:    Advair Diskus 100-50 Mcg/dose Aepb (Fluticasone-salmeterol) ..... One puff two times a day for asthma    Doxycycline Hyclate 100 Mg Tabs (Doxycycline hyclate) ..... One by mouth two times a day for 14 days  Take antibiotics and other medications as directed. Encouraged to push clear liquids, get enough rest, and take acetaminophen as needed. To be seen in 5-7 days if no improvement, sooner if worse.  Complete Medication List: 1)  Metoprolol Tartrate 25 Mg Tabs (Metoprolol tartrate) .... Take 1 tablet by mouth two times a day 2)  Meloxicam 7.5 Mg Tabs (Meloxicam) .... Take 1 tablet by mouth two times a day 3)  Vitamin E 400 Unit Caps (Vitamin e) 4)  Super B Complex/vitamin C Tabs (B complex-c) 5)  Co Q-10 200 Mg Caps (Coenzyme q10) 6)  Aspirin 81 Mg Tbec (Aspirin) .... Take one tablet by mouth daily 7)  Fish Oil 1000 Mg Caps (Omega-3 fatty acids) .... Take 1 capsule by mouth two times a day 8)  Chantix Continuing Month Pak 1 Mg Tabs (Varenicline tartrate) .... One by mouth bid 9)  Simvastatin 40 Mg Tabs (Simvastatin) .... One by mouth once daily for cholesterol, this replaces crestor 10)  Advair Diskus 100-50 Mcg/dose Aepb (Fluticasone-salmeterol) .... One puff two times a day for asthma 11)  Doxycycline Hyclate 100 Mg Tabs (Doxycycline hyclate) .... One by mouth two times a day for 14 days 12)  Hyzaar 100-12.5 Mg Tabs (Losartan potassium-hctz) .... Once daily  Patient Instructions: 1)  Please schedule a follow-up appointment in 2 months. 2)  Check your Blood Pressure regularly. If it is above 130/80: you should make an appointment. 3)  Take your antibiotic as prescribed until ALL of it is gone, but stop if you develop a rash or swelling and contact our office as soon as  possible. 4)  Acute bronchitis symptoms for less than 10 days are not helped by antibiotics. take over the counter cough medications. call if no improvment in  5-7 days, sooner if increasing cough, fever, or new symptoms( shortness of breath, chest pain). Prescriptions: DOXYCYCLINE HYCLATE 100 MG TABS (DOXYCYCLINE HYCLATE) One by mouth two times a day for 14 days  #28 x 1   Entered and Authorized by:   Etta Grandchild MD   Signed by:   Etta Grandchild MD on 08/19/2009   Method used:   Electronically to        [facility]  [address] [id number] (retail)       [address]       [place]  [place], [place]  [address]       Ph: [phone] or [phone]       Fax: [phone]   RxID:   [id number]

## 2010-09-15 NOTE — Assessment & Plan Note (Signed)
Summary: CPX/ SECURE HORIZIONS/[employer]  #   Vital Signs:  Patient profile:   70 year old male Height:      74 inches Weight:      244 pounds BMI:     31.44 O2 Sat:      96 % on Room air Temp:     97.8 degrees F oral Pulse rate:   70 / minute Pulse rhythm:   regular Resp:     16 per minute BP sitting:   148 / 88  (left arm) Cuff size:   large  Vitals Entered By: Rock Nephew CMA (Jan 08, 2010 9:18 AM)  Nutrition Counseling: Patient's BMI is greater than 25 and therefore counseled on weight management options.  O2 Flow:  Room air CC: cpx w/labs, Preventive Care, Lipid Management   Primary Care Provider:  Etta Grandchild MD  CC:  cpx w/labs, Preventive Care, and Lipid Management.  History of Present Illness:  Follow-Up Visit      This is a 70 year old man who presents for Follow-up visit.  The patient denies chest pain, palpitations, dizziness, syncope, edema, SOB, DOE, PND, and orthopnea.  The patient reports taking meds as prescribed, monitoring BP, and dietary compliance.  When questioned about possible medication side effects, the patient notes none.    Lipid Management History:      Positive NCEP/ATP III risk factors include male age 52 years old or older, HDL cholesterol less than 40, hypertension, and ASHD (either angina/prior MI/prior CABG).  Negative NCEP/ATP III risk factors include non-diabetic, no family history for ischemic heart disease, non-tobacco-user status, no prior stroke/TIA, no peripheral vascular disease, and no history of aortic aneurysm.        The patient states that he knows about the "Therapeutic Lifestyle Change" diet.  His compliance with the TLC diet is not at all.  The patient expresses understanding of adjunctive measures for cholesterol lowering.  Adjunctive measures started by the patient include aerobic exercise, fiber, ASA, omega-3 supplements, limit alcohol consumpton, and weight reduction.  He expresses no side effects from his lipid-lowering  medication.  The patient denies any symptoms to suggest myopathy or liver disease.     Preventive Screening-Counseling & Management  Alcohol-Tobacco     Alcohol drinks/day: 0     Smoking Status: quit < 6 months     Smoking Cessation Counseling: no     Smoke Cessation Stage: quit     Packs/Day: 0.5     Passive Smoke Exposure: no     Tobacco Counseling: to remain off tobacco products  Hep-HIV-STD-Contraception     Hepatitis Risk: no risk noted     Hepatitis Risk Counseling: to avoid increased hepatitis risk     HIV Risk: no risk noted     HIV Risk Counseling: to avoid increased HIV risk     STD Risk: no risk noted     STD Risk Counseling: to avoid increased STD risk     TSE monthly: yes     Testicular SE Education/Counseling to perform regular STE  Safety-Violence-Falls     Seat Belt Use: yes     Helmet Use: yes     Firearms in the Home: no firearms in the home     Smoke Detectors: yes     Violence in the Home: no risk noted     Sexual Abuse: no      Sexual History:  currently monogamous.        Drug Use:  never.  Blood Transfusions:  no.    Clinical Review Panels:  Diabetes Management   Creatinine:  1.2 (12/19/2008)   Last Flu Vaccine:  given (06/04/2009)  CBC   WBC:  4.6 (12/19/2008)   RBC:  5.18 (12/19/2008)   Hgb:  17.0 (12/19/2008)   Hct:  48.0 (12/19/2008)   Platelets:  138.0 (12/19/2008)   MCV  92.8 (12/19/2008)   MCHC  35.4 (12/19/2008)   RDW  12.1 (12/19/2008)   PMN:  64.2 (12/19/2008)   Lymphs:  26.0 (12/19/2008)   Monos:  8.1 (12/19/2008)   Eosinophils:  1.5 (12/19/2008)   Basophil:  0.2 (12/19/2008)  Complete Metabolic Panel   Glucose:  109 (12/19/2008)   Sodium:  137 (12/19/2008)   Potassium:  4.4 (12/19/2008)   Chloride:  102 (12/19/2008)   CO2:  30 (12/19/2008)   BUN:  30 (12/19/2008)   Creatinine:  1.2 (12/19/2008)   Albumin:  4.1 (12/19/2008)   Total Protein:  7.1 (12/19/2008)   Calcium:  9.1 (12/19/2008)   Total Bili:  0.9  (12/19/2008)   Alk Phos:  90 (12/19/2008)   SGPT (ALT):  28 (12/19/2008)   SGOT (AST):  22 (12/19/2008)   Medications Prior to Update: 1)  Metoprolol Tartrate 25 Mg Tabs (Metoprolol Tartrate) .... Take 1 Tablet By Mouth Two Times A Day 2)  Meloxicam 7.5 Mg Tabs (Meloxicam) .... Take 1 Tablet By Mouth Two Times A Day 3)  Vitamin E 400 Unit Caps (Vitamin E) 4)  Super B Complex/vitamin C  Tabs (B Complex-C) 5)  Co Q-10 200 Mg Caps (Coenzyme Q10) 6)  Aspirin 81 Mg Tbec (Aspirin) .... Take One Tablet By Mouth Daily 7)  Fish Oil 1000 Mg Caps (Omega-3 Fatty Acids) .... Take 1 Capsule By Mouth Two Times A Day 8)  Chantix Continuing Month Pak 1 Mg Tabs (Varenicline Tartrate) .... One By Mouth Bid 9)  Simvastatin 40 Mg Tabs (Simvastatin) .... One By Mouth Once Daily For Cholesterol, This Replaces Crestor 10)  Advair Diskus 100-50 Mcg/dose Aepb (Fluticasone-Salmeterol) .... One Puff Two Times A Day For Asthma 11)  Doxycycline Hyclate 100 Mg Tabs (Doxycycline Hyclate) .... One By Mouth Two Times A Day For 14 Days 12)  Hyzaar 100-12.5 Mg Tabs (Losartan Potassium-Hctz) .... Once Daily  Current Medications (verified): 1)  Metoprolol Tartrate 25 Mg Tabs (Metoprolol Tartrate) .... Take 1 Tablet By Mouth Two Times A Day 2)  Meloxicam 7.5 Mg Tabs (Meloxicam) .... Take 1 Tablet By Mouth Two Times A Day 3)  Vitamin E 400 Unit Caps (Vitamin E) 4)  Super B Complex/vitamin C  Tabs (B Complex-C) 5)  Co Q-10 200 Mg Caps (Coenzyme Q10) 6)  Aspirin 81 Mg Tbec (Aspirin) .... Take One Tablet By Mouth Daily 7)  Fish Oil 1000 Mg Caps (Omega-3 Fatty Acids) .... Take 1 Capsule By Mouth Two Times A Day 8)  Simvastatin 40 Mg Tabs (Simvastatin) .... One By Mouth Once Daily For Cholesterol, This Replaces Crestor 9)  Hyzaar 100-12.5 Mg Tabs (Losartan Potassium-Hctz) .... Once Daily 10)  Rapaflo 8 Mg Caps (Silodosin) .... One By Mouth Once Daily To Help Urine Flow 11)  Viagra 100 Mg Tabs (Sildenafil Citrate) .... One By  Mouth Once Daily As Directed  Allergies (verified): 1)  ! Keflex 2)  ! Indocin  Past History:  Past Medical History: Reviewed history from 02/28/2009 and no changes required. Current Problems:  HYPERTENSION (ICD-401.9) HYPERLIPIDEMIA (ICD-272.4) CHEST PAIN (ICD-786.50) LOW HDL (ICD-272.5) CORONARY ARTERY DISEASE (ICD-414.00) HYPERTROPHY PROSTATE W/UR OBST &  OTH LUTS (ICD-600.01) SPECIAL SCREENING MALIGNANT NEOPLASM OF PROSTATE (ICD-V76.44) ROUTINE GENERAL MEDICAL EXAM@HEALTH  CARE FACL (ICD-V70.0) OSTEOARTHRITIS (ICD-715.90) LOW BACK PAIN (ICD-724.2) GERD (ICD-530.81) TOBACCO USE (ICD-305.1)  Past Surgical History: Reviewed history from 12/30/2008 and no changes required.  Colonoscopy with biopsies.  Family History: Reviewed history from 09/04/2008 and no changes required. Family History of Alcoholism/Addiction Family History of Arthritis Family History Hypertension  Social History: Reviewed history from 08/19/2009 and no changes required. Married Alcohol use-no Drug use-no Regular exercise-yes Tobacco Use - Yes.  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  Physical Exam  General:  well-developed well-nourished in no acute distress. Skin warm and dry. Head:  normocephalic and atraumatic.   Mouth:  good dentition, pharynx pink and moist, no erythema, no exudates, no posterior lymphoid hypertrophy, and no postnasal drip.   Neck:  supple with no bruits. No thyromegaly. Lungs:  normal respiratory effort, no intercostal retractions, no accessory muscle use, normal breath sounds, no dullness, no fremitus, no crackles, and no wheezes.   Heart:  Normal rate and regular rhythm. S1 and S2 normal without gallop, murmur, click, rub or other extra sounds. Abdomen:  soft, non-tender, normal  bowel sounds, no distention, no masses, no guarding, no rigidity, no rebound tenderness, no hepatomegaly, and no splenomegaly.   Rectal:  no external abnormalities, no hemorrhoids, normal sphincter tone, no masses, no tenderness, no fissures, no fistulae, and no perianal rash.  heme neg. stool. Genitalia:  circumcised, no hydrocele, no varicocele, no scrotal masses, no cutaneous lesions, no urethral discharge, R testes atrophic, and L testes atrophic.   Prostate:  no nodules, no asymmetry, no induration, and 2+ enlarged.   Msk:  normal ROM, no joint tenderness, no joint swelling, no joint warmth, and no redness over joints.   Pulses:  R and L carotid,radial,femoral,dorsalis pedis and posterior tibial pulses are full and equal bilaterally Extremities:  No clubbing, cyanosis, edema, or deformity noted with normal full range of motion of all joints.   Neurologic:  No cranial nerve deficits noted. Station and gait are normal. Plantar reflexes are down-going bilaterally. DTRs are symmetrical throughout. Sensory, motor and coordinative functions appear intact. Skin:  turgor normal, color normal, no rashes, no suspicious lesions, no ecchymoses, no petechiae, no purpura, no ulcerations, and no edema.   Cervical Nodes:  no anterior cervical adenopathy and no posterior cervical adenopathy.   Axillary Nodes:  no R axillary adenopathy and no L axillary adenopathy.   Inguinal Nodes:  no R inguinal adenopathy and no L inguinal adenopathy.   Psych:  Cognition and judgment appear intact. Alert and cooperative with normal attention span and concentration. No apparent delusions, illusions, hallucinations   Impression & Recommendations:  Problem # 1:  ERECTILE DYSFUNCTION (ICD-302.72) Assessment New  His updated medication list for this problem includes:    Viagra 100 Mg Tabs (Sildenafil citrate) ..... One by mouth once daily as directed  Orders: DRE (G0102)  Problem # 2:  HYPERTROPHY PROSTATE W/UR OBST & OTH  LUTS (ICD-600.01) Assessment: Deteriorated  His updated medication list for this problem includes:    Rapaflo 8 Mg Caps (Silodosin) ..... One by mouth once daily to help urine flow  Orders: Venipuncture ([address]) TLB-Lipid Panel (80061-LIPID) TLB-BMP (Basic Metabolic Panel-BMET) (80048-METABOL) TLB-CBC Platelet - w/Differential (85025-CBCD) TLB-Hepatic/Liver Function Pnl (80076-HEPATIC) TLB-TSH (Thyroid Stimulating Hormone) (84443-TSH) TLB-PSA (Prostate Specific Antigen) (84153-PSA)  TLB-Udip w/ Micro (81001-URINE) TLB-A1C / Hgb A1C (Glycohemoglobin) (83036-A1C) DRE (J1914)  PSA: 0.67 (12/19/2008)     Problem # 3:  HYPERTENSION (ICD-401.9) Assessment: Improved  His updated medication list for this problem includes:    Metoprolol Tartrate 25 Mg Tabs (Metoprolol tartrate) .Marland Kitchen... Take 1 tablet by mouth two times a day    Hyzaar 100-12.5 Mg Tabs (Losartan potassium-hctz) ..... Once daily  Orders: Venipuncture (78295) TLB-Lipid Panel (80061-LIPID) TLB-BMP (Basic Metabolic Panel-BMET) (80048-METABOL) TLB-CBC Platelet - w/Differential (85025-CBCD) TLB-Hepatic/Liver Function Pnl (80076-HEPATIC) TLB-TSH (Thyroid Stimulating Hormone) (84443-TSH) TLB-PSA (Prostate Specific Antigen) (84153-PSA) TLB-Udip w/ Micro (81001-URINE) TLB-A1C / Hgb A1C (Glycohemoglobin) (83036-A1C)  BP today: 148/88 Prior BP: 168/78 (08/19/2009)  Prior 10 Yr Risk Heart Disease: N/A (12/26/2008)  Labs Reviewed: K+: 4.4 (12/19/2008) Creat: : 1.2 (12/19/2008)   Chol: 138 (12/19/2008)   HDL: 24.60 (12/19/2008)   LDL: 81 (12/19/2008)   TG: 164.0 (12/19/2008)  Problem # 4:  HYPERLIPIDEMIA (ICD-272.4) Assessment: Unchanged  His updated medication list for this problem includes:    Simvastatin 40 Mg Tabs (Simvastatin) ..... One by mouth once daily for cholesterol, this replaces crestor  Orders: Venipuncture (62130) TLB-Lipid Panel (80061-LIPID) TLB-BMP (Basic Metabolic Panel-BMET) (80048-METABOL) TLB-CBC  Platelet - w/Differential (85025-CBCD) TLB-Hepatic/Liver Function Pnl (80076-HEPATIC) TLB-TSH (Thyroid Stimulating Hormone) (84443-TSH) TLB-PSA (Prostate Specific Antigen) (84153-PSA) TLB-Udip w/ Micro (81001-URINE) TLB-A1C / Hgb A1C (Glycohemoglobin) (83036-A1C)  Labs Reviewed: SGOT: 22 (12/19/2008)   SGPT: 28 (12/19/2008)  Lipid Goals: Chol Goal: 200 (07/23/2009)   HDL Goal: 40 (07/23/2009)   LDL Goal: 100 (07/23/2009)   TG Goal: 150 (07/23/2009)  Prior 10 Yr Risk Heart Disease: N/A (12/26/2008)   HDL:24.60 (12/19/2008), 26.4 (01/11/2007)  LDL:81 (12/19/2008), DEL (86/57/8469)  Chol:138 (12/19/2008), 136 (01/11/2007)  Trig:164.0 (12/19/2008), 205 (01/11/2007)  Problem # 5:  ROUTINE GENERAL MEDICAL EXAM@HEALTH  CARE FACL (ICD-V70.0) Assessment: Unchanged  Colonoscopy: Normal (12/09/2004) Td Booster: Td (01/08/2010)   Flu Vax: given (06/04/2009)   Chol: 138 (12/19/2008)   HDL: 24.60 (12/19/2008)   LDL: 81 (12/19/2008)   TG: 164.0 (12/19/2008) TSH: 1.26 (12/19/2008)   PSA: 0.67 (12/19/2008)  Discussed using sunscreen, use of alcohol, drug use, self testicular exam, routine dental care, routine eye care, routine physical exam, seat belts, multiple vitamins,  and recommendations for immunizations.  Discussed exercise and checking cholesterol.  Discussed gun safety. Also recommend checking PSA.  Orders: Hemoccult Guaiac-1 spec.(in office) (82270)  Problem # 6:  GERD (ICD-530.81) Assessment: Improved  Orders: Venipuncture (62952) TLB-Lipid Panel (80061-LIPID) TLB-BMP (Basic Metabolic Panel-BMET) (80048-METABOL) TLB-CBC Platelet - w/Differential (85025-CBCD) TLB-Hepatic/Liver Function Pnl (80076-HEPATIC) TLB-TSH (Thyroid Stimulating Hormone) (84443-TSH) TLB-PSA (Prostate Specific Antigen) (84153-PSA) TLB-Udip w/ Micro (81001-URINE) TLB-A1C / Hgb A1C (Glycohemoglobin) (83036-A1C) Hemoccult Guaiac-1 spec.(in office) (82270) DRE (W4132)  Labs Reviewed: Hgb: 17.0 (12/19/2008)   Hct:  48.0 (12/19/2008)  Complete Medication List: 1)  Metoprolol Tartrate 25 Mg Tabs (Metoprolol tartrate) .... Take 1 tablet by mouth two times a day 2)  Meloxicam 7.5 Mg Tabs (Meloxicam) .... Take 1 tablet by mouth two times a day 3)  Vitamin E 400 Unit Caps (Vitamin e) 4)  Super B Complex/vitamin C Tabs (B complex-c) 5)  Co Q-10 200 Mg Caps (Coenzyme q10) 6)  Aspirin 81 Mg Tbec (Aspirin) .... Take one tablet by mouth daily 7)  Fish Oil 1000 Mg Caps (Omega-3 fatty acids) .... Take 1 capsule by mouth two times a day 8)  Simvastatin 40 Mg Tabs (Simvastatin) .... One by mouth once daily for cholesterol, this replaces crestor 9)  Hyzaar  100-12.5 Mg Tabs (Losartan potassium-hctz) .... Once daily 10)  Rapaflo 8 Mg Caps (Silodosin) .... One by mouth once daily to help urine flow 11)  Viagra 100 Mg Tabs (Sildenafil citrate) .... One by mouth once daily as directed  Other Orders: TD Toxoids IM 7 YR + ([address]) Admin 1st Vaccine (60454)  Lipid Assessment/Plan:      Based on NCEP/ATP III, the patient's risk factor category is "history of coronary disease, peripheral vascular disease, cerebrovascular disease, or aortic aneurysm".  The patient's lipid goals are as follows: Total cholesterol goal is 200; LDL cholesterol goal is 100; HDL cholesterol goal is 40; Triglyceride goal is 150.    Colorectal Screening:  Current Recommendations:    Hemoccult: NEG X 1 today    Colonoscopy recommended: scheduled with G.I.  PSA Screening:    PSA: 0.67  (12/19/2008)    Reviewed PSA screening recommendations: PSA ordered  Immunization & Chemoprophylaxis:    Tetanus vaccine: Td  (01/08/2010)    Influenza vaccine: given  (06/04/2009)  Patient Instructions: 1)  Please schedule a follow-up appointment in 4 months. 2)  It is important that you exercise regularly at least 20 minutes 5 times a week. If you develop chest pain, have severe difficulty breathing, or feel very tired , stop exercising immediately and seek  medical attention. 3)  You need to lose weight. Consider a lower calorie diet and regular exercise.  4)  Check your Blood Pressure regularly. If it is above 130/80: you should make an appointment. Prescriptions: METOPROLOL TARTRATE 25 MG TABS (METOPROLOL TARTRATE) Take 1 tablet by mouth two times a day  #60 x 11   Entered and Authorized by:   Etta Grandchild MD   Signed by:   Etta Grandchild MD on 01/08/2010   Method used:   [facility] to        [facility]  [address] [id number]       [address]       [place], [place]  [address]       Ph: [phone] or [phone]       Fax: [phone]   RxID:   [id number] VIAGRA 100 MG TABS (SILDENAFIL CITRATE) One by mouth once daily as directed  #12 x 11   Entered and Authorized by:   Etta Grandchild MD   Signed by:   Etta Grandchild MD on 01/08/2010   Method used:   [facility]ically to        [facility]  [address] [id number] ([facility]       [address]       [place], [place]  [address]       Ph: [phone] or [phone]       Fax: [phone]   RxID:   [id number] RAPAFLO 8 MG CAPS (SILODOSIN) One by mouth once daily to help urine flow  #70 x 0   Entered and Authorized by:   Etta Grandchild MD   Signed by:   Etta Grandchild MD on 01/08/2010   Method used:   Samples Given   RxID:   [id number]      Immunizations Administered:  Tetanus Vaccine:    Vaccine Type: Td    Site: right deltoid    Mfr: Sanofi Pasteur    Dose: 0.5 ml    Route: IM    Given by: Rock Nephew CMA    Exp. Date: 08/29/2011  Lot #: [id number]    VIS given: 07/04/07 version given Jan 08, 2010.  Not Administered:    Influenza Vaccine not given due to: vaccine availability

## 2010-09-15 NOTE — Procedures (Signed)
Summary: Colonoscopy / [facility]  Colonoscopy / [facility]   Imported By: Lennie Odor 07/15/2010 15:59:58  _____________________________________________________________________  External Attachment:    Type:   Image     Comment:   External Document

## 2010-09-15 NOTE — Progress Notes (Signed)
    Colorectal Screening:  Colonoscopy Results:    Date of Exam: 06/24/2010    Results: Adenomatous Polyp  PSA Screening:    PSA: 0.79  (01/08/2010)  Immunization & Chemoprophylaxis:    Tetanus vaccine: Td  (01/08/2010)    Influenza vaccine: given  (06/04/2009)

## 2010-09-15 NOTE — Progress Notes (Signed)
  Phone Note From Pharmacy   Call[facility]  [address] [phone] Reason for Call: Medication not on formulary Summary of Call: Per [facility] Tussinex is not covered by pt insurance. They are requesting that MD send something else, they suggest hycoden. Initial call taken by: Rock Nephew CMA,  April 06, 2010 1:55 PM  Follow-up for Phone Call        you can try this but it looks like his insurance does not cover any cough medicines Follow-up by: Etta Grandchild MD,  April 06, 2010 2:09 PM  Additional Follow-up for Phone Call Additional follow up Details #1::        Pharmacist notified.Marland KitchenMarland KitchenAlvy Beal Archie CMA  April 06, 2010 3:35 PM     New/Updated Medications: HYDROMET 5-1.5 MG/5ML SYRP (HYDROCODONE-HOMATROPINE) 5 ml by mouth QID as needed for cough Prescriptions: HYDROMET 5-1.5 MG/5ML SYRP (HYDROCODONE-HOMATROPINE) 5 ml by mouth QID as needed for cough  #6 ounces x 0   Entered and Authorized by:   Etta Grandchild MD   Signed by:   Etta Grandchild MD on 04/06/2010   Method used:   Historical   RxID:   [id number]

## 2010-09-15 NOTE — Letter (Signed)
Summary: Results Follow-up Letter  [facility]  [address] [place], [place] [address]   Phone: [phone]  Fax: [phone]    04/06/2010  [address] [place], [place]  [address]  Dear [name],   The following are the results of your recent test(s):  Test     Result     Chest Xray     normal Blood sugar     slightly elevated Liver/kidney   normal CBC       normal Thyroid     normal   _________________________________________________________  Please call for an appointment as directed _________________________________________________________ _________________________________________________________ _________________________________________________________  Sincerely,  Sanda Linger MD [facility]

## 2010-09-17 ENCOUNTER — Other Ambulatory Visit: Payer: Self-pay | Admitting: Dermatology

## 2011-01-01 NOTE — H&P (Signed)
NAME:  WOODWARD, KLEM NO.:  [id number]   MEDICAL RECORD NO.:  [id number]          PATIENT TYPE:   LOCATION:                                 FACILITY:   PHYSICIAN:  Madolyn Frieze. Jens Som, MD, [facility] DATE OF BIRTH:   DATE OF ADMISSION:  11/28/2006  DATE OF DISCHARGE:                              HISTORY & PHYSICAL   Mr. Jackolyn Confer is a very pleasant 70 year old male with past medical  history of hypertension and tobacco abuse who are evaluating for chest  pain.  He has no prior cardiac history.  Over the past 1 month he has  had occasional chest pain that is substernal in location.  The pain is  described as indigestion.  There is a tingling in his left upper  extremity but the pain does not radiate.  There is no associated  shortness of breath, nausea, vomiting or diaphoresis.  The pain is not  clearly exertional.  There is a question of increase with deep  inspiration and after eating.  There is no associated water brash.  These symptoms typically lasts approximately 5-10 minutes and resolve  spontaneously.  His last episode was this morning in the office.  Because of symptoms, Dr. Penni Bombard asked [place] to evaluate.  Note he also has  dyspnea on exertion which he attributes to his cigarette use.  There is  no orthopnea or PND.  There is no pedal edema, palpitations or syncope.  His medications include Atacand/HCTZ 16/12.5 mg tablets 1 p.o. every  day, metoprolol ER 50 mg every day, aspirin 325 mg p.o. every day,  meloxicam 15 mg p.o. every day, vitamin C 1,000 mg p.o. every day,  vitamin E 400 international units p.o. every day and vitamin B complex 1  every day, multivitamin 1 every day, Co-Enzyme Q 200 mg p.o. every day,  and calcium, magnesium and zinc.   ALLERGIES:  He has NO KNOWN DRUG ALLERGIES.   SOCIAL HISTORY:  He does smoke and has for approximately 30 years.  He  rarely consumes alcohol.   FAMILY HISTORY:  Negative for coronary disease although he does have  a  brother who has a mental disorder and was born with cardiac defect  described as a hole in his heart.   PAST MEDICAL HISTORY:  Significant for hypertension but there is no  diabetes mellitus or hyperlipidemia.  There are no other medical  problems noted, although he does state he has reflux occasionally.  He  has had previous eye surgery for a football accident.   REVIEW OF SYSTEMS:  He denies any headaches or fevers or chills.  There  is no productive cough or hemoptysis.  There is no dysphagia,  odynophagia, melena or hematochezia.  There is no dysuria, hematuria,  rash or seizure activity.  There is no orthopnea, PND or pedal edema.  There is no claudication.  Remaining systems are negative.  The patient  has chronic low back pain.   PHYSICAL EXAM:  VITAL SIGNS:  Blood pressure 130/74.  His pulse is 71.  He weighs 240 pounds.  GENERAL:  Well-developed and somewhat obese.  He  is in no acute  distress.  SKIN:  Warm and dry.  He does not appear to be depressed.  There is no peripheral clubbing.  BACK:  Normal.  HEENT is unremarkable with normal eyelids.  NECK is supple with a normal upstroke bilaterally.  No bruits noted.  There is no jugular venous distension.  I cannot appreciate thyromegaly.  CHEST: Clear to auscultation with normal expansion.  CARDIOVASCULAR: Regular rhythm.  Normal S1-S2.  There are no murmurs,  rubs or gallops noted.  His PMI is nondisplaced.  ABDOMINAL EXAM:  Nontender and positive bowel sounds.  No  hepatosplenomegaly.  No mass appreciated.  There is no abdominal bruit.  EXTREMITIES:  He has 2+ femoral pulses bilaterally.  No bruits.  His  extremities show no edema.  I can palpate no cords.  He has 2+ posterior  tibial pulses bilaterally.  NEUROLOGIC EXAM:  Grossly intact.   His electrocardiogram today shows a sinus rhythm at a rate of 71.  The  axis is normal.  There is subtle lateral T-wave inversion in I and aVL.   DIAGNOSES:  1. Atypical chest  pain - the patient presents for evaluation of      atypical chest pain.  The etiology is not clear to me.  However, he      has risk factors and had chest pain in the office today for      approximately 10 minutes.  I feel the most prudent course of action      would be to admit the patient and rule out myocardial infarction      with serial enzymes.  We will also check an electrocardiogram      tomorrow morning.  If his enzymes are negative ans his      electrocardiogram is unchanged, then he will have an outpatient      Myoview for risk stratification.  We will continue with his aspirin      and beta-blocker.  2. Tobacco abuse - the patient states that he will begin Chantix in      the near future and should discontinue his tobacco use.  3. Hypertension.  His blood pressure appears to be well-controlled on      his present medications.  4. Possible gastric reflux disease - we will add Protonix for possible      GI etiology of his symptoms.   I will make further recommendations once we have the above information.      Madolyn Frieze Jens Som, MD, [facility]  Electronically Signed     BSC/MEDQ  D:  11/28/2006  T:  11/28/2006  Job:  [id number]

## 2011-01-01 NOTE — Letter (Signed)
Dec 22, 2006     RE:  BANJAMIN, STOVALL  MRN:  [id number]  /  DOB:  [birth year]   To whom it may concern:   [name] is a patient of mine in [place], [place].  He was  recently admitted to [facility] with chest pain that was  concerning.  He did rule out for myocardial infarction and initial plan  had been to perform a Myoview for risk stratification.  However, he had  recurrent/persistent symptoms the following morning and was seen on  daily rounds.  It was felt that given the recurrent nature of his  symptoms, that he would require cardiac catheterization for definitive  evaluation.  This revealed moderate coronary disease and this has been  treated medically.  Based on the above, I think his medical bills should  be covered.  Please feel free to contact me if there are any concerns or  questions about this issue.    Sincerely,      Madolyn Frieze. Jens Som, MD, [facility]  Electronically Signed    BSC/MedQ  DD: 12/22/2006  DT: 12/22/2006  Job #: [id number]

## 2011-01-01 NOTE — Assessment & Plan Note (Signed)
[facility]                            CARDIOLOGY OFFICE NOTE   Jack, Farmer                      MRN:          [id number]  DATE:12/19/2006                            DOB:          Jan 01, 1941    [name] is a very pleasant gentleman that was recently admitted to  [facility] with chest pain. He ruled out for myocardial  infarction with serial enzymes. However, he had persistent symptoms and  was seen by Dr. Corinda Gubler the following morning. Because of his recurrent  symptoms and the description, it was felt that he would require cardiac  catheterization. This was performed on November 29, 2006. He was found to  have 20% proximal LAD, a 40-50% circumflex in the distal circumflex and  a 40-50% mid circumflex lesion. There was a 50% right coronary artery  and otherwise nonobstructive disease. His ejection fraction was 65%. He  has been treated medically. Since then he has done well with no dyspnea,  chest pain, palpitations or syncope and there is no pedal edema.   CURRENT MEDICATIONS:  1. Atacand HCT 16/12.5 mg tablets 1 p.o. daily.  2. Protonix 40 mg p.o. daily.  3. Toprol 50 mg p.o. daily.  4. Meloxicam 15 mg p.o. daily.  5. Aspirin 81 mg p.o. daily.  6. Zocor 40 mg p.o. daily.  7. Chantix.  8. Coenzyme Q.  9. Vitamin C.  10.Vitamin E.  11.Vitamin B.  12.Multivitamin.   PHYSICAL EXAMINATION:  VITAL SIGNS:  Blood pressure 126/82 and his pulse  is 68. He weighs 245 pounds.  NECK:  Supple with no bruits.  CHEST:  Clear.  CARDIOVASCULAR:  Regular rate and rhythm.  RIGHT GROIN:  Shows no hematoma and no bruit.  EXTREMITIES:  Show no edema.   Electrocardiogram  shows a sinus rhythm at a rate of 68. There are no  significant ST changes.   DIAGNOSES:  1. Coronary artery disease - his disease does not appear to be      obstructive. We will continue with risk factor modification and      medical therapy. I will continue with his  aspirin, statin and beta      blocker as well as his ARB.  2. Hypertension - his blood pressure appears to be well controlled on      his present medications and we will continue the same dose.  3. Tobacco abuse - the patient has discontinued his tobacco use and we      discussed the importance of this again today. He will continue on      his Chantix.  4. Hyperlipidemia - we will continue with his Zocor and we will check      lipids and liver at the end of May. We will adjust for the goal LDL      of less than 70 given his history of coronary disease.  5. Risk factor modification. We discussed the importance of diet and      exercise.   I will see him back in 12 months.     Madolyn Frieze Jack Som, MD, [facility]  Electronically Signed    BSC/MedQ  DD: 12/19/2006  DT: 12/19/2006  Job #: [id number]

## 2011-01-01 NOTE — Discharge Summary (Signed)
NAME:  JAKHAI, FANT NO.:  [id number]   MEDICAL RECORD NO.:  [id number]          PATIENT TYPE:  INP   LOCATION:  4741                         FACILITY:  [facility]   PHYSICIAN:  Madolyn Frieze. Jens Som, MD, FACCDATE OF BIRTH:  1941/04/01   DATE OF ADMISSION:  11/28/2006  DATE OF DISCHARGE:  11/30/2006                               DISCHARGE SUMMARY   PRIMARY CARDIOLOGIST:  Dr. Olga Millers.   PRIMARY CARE PHYSICIAN:  Not listed.   PROCEDURES PERFORMED DURING HOSPITALIZATION:  1. Cardiac catheterization on November 29, 2006, per Dr. Simona Huh.      a.     Mild to moderate coronary atherosclerosis.  There is       approximately 50% stenosis within the mid right coronary artery,       as well as an area of 40%-50% stenosis within the circumflex.       There is no clear need for revascularization at this point.  Left       ventricular ejection fraction approximately 65% with no       significant mitral regurgitation and left ventricular end       diastolic pressure is 11 mmHg.  We will treat medically at       present.   PRIMARY DIAGNOSES:  1. Chest pain.      a.     Known coronary artery disease, status post cardiac       catheterization as described above.  2. Tobacco abuse.  3. Hypertension.   HISTORY OF PRESENT ILLNESS:  This is a 70 year old Caucasian male with  above-mentioned medical history, but no prior cardiac history who has  had increased complaints of, what he described as, indigestion, tingling  in his left upper extremity, but the pain does not radiate.  There was  no associated shortness of breath, nausea or vomiting or diaphoresis.  The patient noticed it was difficult to take deep breaths after eating.  The patient stated that they lasted approximately 5-10 minutes and  resolved on their own.   As a result of these symptoms, the patient presented to the emergency  room for further evaluation.  The patient was seen and examined by Dr.  Olga Millers and admitted to rule out cardiac etiology for chest  discomfort.  The patient had subsequent cardiac catheterization  completed on November 29, 2006 with results as described above.  Please see  Dr. Diona Browner thorough detailed cardiac catheterization note for further  information.  The patient tolerated the procedure well.  His cardiac  enzymes were found to be negative x3.  An EKG revealed minimal non-ST  segment abnormalities with T wave inversion noted.   The patient had no further complaints of chest discomfort.  The patient  had no evidence of bleeding, hematoma, bruit or infection at the cath  site post-procedure.  The patient's Zocor was increased to 40 mg 1 p.o.  q.h.s. and had thorough discussion with Dr. Jens Som and Dr. Diona Browner  concerning the results of the catheterization and need for smoking  cessation.   On day of discharge, the patient was seen and  examined by Dr. Jens Som.  The patient was found to be stable.  Blood pressure 126/70, heart rate  65.  Lab work was found to be normal.  The patient was found to be  stable for discharge and will have a followup appointment with Dr.  Jens Som in approximately 3 weeks with lipids and LFTs to be obtained in  6 weeks.   LABS ON DISCHARGE:  Sodium 138, potassium 4.1, chloride 105, CO2 28, BUN  21, creatinine 1.2, sodium 121, hemoglobin 15.8, hematocrit 45.3, white  blood cells 5.8, platelets 171.  Cardiac enzymes:  0.01, 0.02 and 0.01  respectively.  EKG, on discharge, normal sinus rhythm, ventricular rate  of 68 beats per minute.   DISCHARGE MEDICATIONS:  1. Zocor 40 mg at bedtime (new prescription).  2. Multivitamin daily.  3. Aspirin 325 daily.  4. Lopressor 50 mg daily.  5. Avapro 300 mg daily.  6. HCTZ 12.5 daily.  7. Protonix 80 mg daily.   ALLERGIES:  NO KNOWN DRUG ALLERGIES.   FOLLOWUP PLANS AND APPOINTMENT:  1. The patient will follow up with Dr. Olga Millers on May 5 at      11:45 a.m. postdischarge  evaluation and continuation of cardiac      care.  2. The patient is scheduled to have lipids and LFTs drawn at 8:30      a.m., May 28, which will be completed fasting.  3. The patient has been advised on smoking cessation.  4. The patient has been given post-cardiac catheterization      instructions with particular emphasis on the right groin site for      evidence of bleeding, hematoma, infection or swelling.  The patient      verbalizes understanding.   Time spent with the patient, to include physician time, 35 minutes.      Bettey Mare. Lyman Bishop, NP      Madolyn Frieze. Jens Som, MD, [facility]  Electronically Signed    KML/MEDQ  D:  11/30/2006  T:  11/30/2006  Job:  [id number]

## 2011-01-01 NOTE — Op Note (Signed)
NAME:  Jack Farmer, Jack Farmer NO.:  [id number]   MEDICAL RECORD NO.:  [id number]          PATIENT TYPE:  AMB   LOCATION:  ENDO                         FACILITY:  [facility]   PHYSICIAN:  Bernette Redbird, M.D.   DATE OF BIRTH:  Jul 28, 1941   DATE OF PROCEDURE:  12/09/2004  DATE OF DISCHARGE:                                 OPERATIVE REPORT   PROCEDURE:  Colonoscopy with biopsies.   INDICATIONS:  A 70 year old gentleman for colon cancer screening.  He has no  worrisome risk symptoms, but there is a family history of colon cancer in  his maternal grandfather.   FINDINGS:  Several diminutive polyps removed.   PROCEDURE:  The nature, purpose and risks of the procedure had been  discussed with the patient who provided written consent.  Sedation was  fentanyl 35 mcg and Versed 3.5 milligrams IV without arrhythmias or  desaturation.  Digital exam of the prostate was unremarkable.  The Olympus  adult adjustable tension video colonoscope was advanced to the cecum without  difficulty and pullback was then performed.  The quality of prep was  excellent and it was felt that all areas were well seen.   Three diminutive sessile polyps were removed by cold biopsy technique.  Each  measured about 2-3 mm across.  One was in the mid colon, one was at 18 cm,  and one was at 12 cm.  No large polyps, cancer, colitis, vascular  malformations or diverticulosis were noted and retroflexion in the rectum  was unremarkable.  The patient tolerated the procedure well and there were  no apparent complications.   IMPRESSION:  1.  Diminutive colon polyps removed as described above (211.3).  2.  Family history of colon cancer in the patient's grandmother.   PLAN:  Await pathology results.  Anticipate colonoscopic followup in 3-5  years in view of the family history, depending on the histologic findings.      RB/MEDQ  D:  12/09/2004  T:  12/09/2004  Job:  [id number]   cc:   Quita Skye. Artis Flock, M.D.  [address], [address]  [place]  [place] [address]  Fax: [phone]

## 2011-01-01 NOTE — Cardiovascular Report (Signed)
NAME:  Jack Farmer, Jack Farmer NO.:  [id number]   MEDICAL RECORD NO.:  [id number]          PATIENT TYPE:  INP   LOCATION:  4741                         FACILITY:  [facility]   PHYSICIAN:  Jonelle Sidle, MD DATE OF BIRTH:  [birth year]   DATE OF PROCEDURE:  DATE OF DISCHARGE:                            CARDIAC CATHETERIZATION   REQUESTING CARDIOLOGIST:  Dr. Orlean Patten [place].   PRIMARY CARDIOLOGIST:  Dr. Olga Millers.   INDICATIONS:  [name] is a 70 year old male with a history of  hypertension and tobacco use, who was recently admitted to the hospital  with chest discomfort.  He ruled out for myocardial infarction with  serial cardiac markers.  He was seen by Dr. Corinda Gubler and following a  discussion of the potential risks and benefits, is now referred for a  diagnostic cardiac catheterization to clearly outline the coronary  anatomy and assess for any potential revascularization strategies.  Informed consent was obtained.   PROCEDURE PERFORMED:  1. Left heart catheterization  2. Selective coronary angiography.  3. Left ventriculography.   ACCESS AND EQUIPMENT:  The area about the right femoral artery was  anesthetized with 1% lidocaine.  A 5-French sheath was placed in the  right femoral artery via the modified Seldinger technique.  Standard  preformed 5-French JL-4 and JR-4 catheters were used for selective  coronary angiography and an angled pigtail catheter was used for left  heart catheterization and left ventriculography.  All exchanges were  made over a wire.  A total of 120 mL of Omnipaque were used.  Prior to  the procedure, the patient and his wife had expressed an interest and a  vascular closure device, given the patient's history of back pain and  concerns about lying supine for an extended period of time.  An  angiogram of the right femoral artery was obtained, noting good  placement of the arterial sheath from the perspective of a potential  closure device.  Under direct observation of our certified  catheterization technology staff, an Angio-Seal device was employed.  Despite dilating the tract over a wire, the device would not slid easily  into the vessel, meeting what was felt to be unusual resistance.  Rather  than place this device and risk potential vascular injury, we elected to  abort placement of the closure device and simply held manual pressure  under direct observation.  The patient tolerated this well and there was  no significant hematoma or hemorrhage observed.   HEMODYNAMIC RESULTS:  Aorta 109/57 mmHg.  Left ventricle 108/11 mmHg.   ANGIOGRAPHIC FINDINGS:  1. The left main coronary artery gives rise to the left anterior      descending and circumflex coronary arteries.  There is no      significant flow-limiting stenosis noted within this vessel.  2. The left anterior descending is a medium-caliber vessel with 3      diagonal branches, the first of which is the largest and proximal,      near the origin of the left anterior descending.  There are minor      luminal irregularities noted including approximately  20% stenosis      in the proximal left anterior descending.  No flow-limiting      stenoses are noted.  3. The circumflex coronary artery is a medium-caliber vessel with 4      obtuse marginal branches, the last of which is quite large.  Within      the distal circumflex proper is an area of diffuse 40% to 50%      stenosis.  At the takeoff of the large obtuse marginal, there is an      area of 40% to 50% stenosis within the mid circumflex.  The obtuse      marginal itself has approximately 30% stenosis proximally.  Within      the proximal to mid vessel is an area of 20% stenosis.  4. The right coronary artery is a medium-caliber dominant vessel with      relatively large posterior descending branch.  There is an area of      focal 50% stenosis within the mid right coronary artery.  Preceding       this is an area of 20% stenosis and in the distal segment of the      vessel is an area of 30% stenosis.   LEFT VENTRICULOGRAPHY:  Performed in the RAO projection and reveals an  ejection fraction of approximately 65% with no significant mitral  regurgitation.   DIAGNOSES:  1. Mild to moderate coronary atherosclerosis as outlined above.  There      is approximately 50% stenosis within the mid right coronary artery      as well as an area of 40% to 50% stenosis within the circumflex, as      discussed.  2. Left ventricular ejection fraction is approximately 65% with no      significant mitral regurgitation and a left ventricle end-diastolic      pressure of 11 mmHg.   I discussed the results with the patient and his wife in detail.  I  reviewed the films with her as well.  At this point, I would anticipate  medical therapy aimed at risk factor modification.  No clear need for  revascularization at this point.      Jonelle Sidle, MD  Electronically Signed     SGM/MEDQ  D:  11/29/2006  T:  11/30/2006  Job:  [id number]   cc:   Cecil Cranker, MD, [facility]  Madolyn Frieze. Jens Som, MD, [facility]

## 2011-01-23 ENCOUNTER — Other Ambulatory Visit: Payer: Self-pay | Admitting: Internal Medicine

## 2011-02-26 ENCOUNTER — Other Ambulatory Visit: Payer: Self-pay | Admitting: Internal Medicine

## 2011-06-24 ENCOUNTER — Other Ambulatory Visit: Payer: Self-pay | Admitting: Internal Medicine

## 2011-07-20 ENCOUNTER — Other Ambulatory Visit: Payer: Self-pay | Admitting: Internal Medicine

## 2011-08-02 ENCOUNTER — Telehealth: Payer: Self-pay

## 2011-08-02 MED ORDER — LOSARTAN POTASSIUM-HCTZ 100-12.5 MG PO TABS: 1.0000 | ORAL_TABLET | Freq: Every day | ORAL | Status: DC

## 2011-08-02 NOTE — Telephone Encounter (Signed)
refill 

## 2011-08-21 ENCOUNTER — Other Ambulatory Visit: Payer: Self-pay | Admitting: Internal Medicine

## 2011-08-24 ENCOUNTER — Other Ambulatory Visit: Payer: Self-pay | Admitting: Internal Medicine

## 2011-08-26 ENCOUNTER — Other Ambulatory Visit (INDEPENDENT_AMBULATORY_CARE_PROVIDER_SITE_OTHER): Payer: Medicare Other

## 2011-08-26 ENCOUNTER — Ambulatory Visit (INDEPENDENT_AMBULATORY_CARE_PROVIDER_SITE_OTHER): Payer: Medicare Other | Admitting: Internal Medicine

## 2011-08-26 ENCOUNTER — Encounter: Payer: Self-pay | Admitting: Internal Medicine

## 2011-08-26 DIAGNOSIS — R7309 Other abnormal glucose: Secondary | ICD-10-CM

## 2011-08-26 DIAGNOSIS — I10 Essential (primary) hypertension: Secondary | ICD-10-CM

## 2011-08-26 DIAGNOSIS — Z0000 Encounter for general adult medical examination without abnormal findings: Secondary | ICD-10-CM

## 2011-08-26 DIAGNOSIS — Z125 Encounter for screening for malignant neoplasm of prostate: Secondary | ICD-10-CM

## 2011-08-26 DIAGNOSIS — N401 Enlarged prostate with lower urinary tract symptoms: Secondary | ICD-10-CM

## 2011-08-26 DIAGNOSIS — E785 Hyperlipidemia, unspecified: Secondary | ICD-10-CM

## 2011-08-26 DIAGNOSIS — R7303 Prediabetes: Secondary | ICD-10-CM | POA: Insufficient documentation

## 2011-08-26 DIAGNOSIS — R739 Hyperglycemia, unspecified: Secondary | ICD-10-CM

## 2011-08-26 DIAGNOSIS — F528 Other sexual dysfunction not due to a substance or known physiological condition: Secondary | ICD-10-CM

## 2011-08-26 DIAGNOSIS — Z136 Encounter for screening for cardiovascular disorders: Secondary | ICD-10-CM

## 2011-08-26 DIAGNOSIS — I2510 Atherosclerotic heart disease of native coronary artery without angina pectoris: Secondary | ICD-10-CM

## 2011-08-26 LAB — CBC WITH DIFFERENTIAL/PLATELET
Basophils Absolute: 0 10*3/uL (ref 0.0–0.1)
Eosinophils Absolute: 0 10*3/uL (ref 0.0–0.7)
Lymphocytes Relative: 36.9 % (ref 12.0–46.0)
MCHC: 35.1 g/dL (ref 30.0–36.0)
Monocytes Relative: 7.7 % (ref 3.0–12.0)
Neutrophils Relative %: 53.7 % (ref 43.0–77.0)
Platelets: 168 10*3/uL (ref 150.0–400.0)
RDW: 12.7 % (ref 11.5–14.6)

## 2011-08-26 LAB — LIPID PANEL
Cholesterol: 94 mg/dL (ref 0–200)
LDL Cholesterol: 47 mg/dL (ref 0–99)
Total CHOL/HDL Ratio: 3
Triglycerides: 96 mg/dL (ref 0.0–149.0)
VLDL: 19.2 mg/dL (ref 0.0–40.0)

## 2011-08-26 LAB — PSA: PSA: 0.85 ng/mL (ref 0.10–4.00)

## 2011-08-26 LAB — URINALYSIS, ROUTINE W REFLEX MICROSCOPIC
Bilirubin Urine: NEGATIVE
Hgb urine dipstick: NEGATIVE
Ketones, ur: NEGATIVE
Nitrite: NEGATIVE
Total Protein, Urine: NEGATIVE
pH: 6 (ref 5.0–8.0)

## 2011-08-26 LAB — COMPREHENSIVE METABOLIC PANEL
ALT: 26 U/L (ref 0–53)
AST: 27 U/L (ref 0–37)
Albumin: 4.2 g/dL (ref 3.5–5.2)
CO2: 29 mEq/L (ref 19–32)
Calcium: 8.9 mg/dL (ref 8.4–10.5)
Chloride: 100 mEq/L (ref 96–112)
Creatinine, Ser: 1.2 mg/dL (ref 0.4–1.5)
Potassium: 4.6 mEq/L (ref 3.5–5.1)
Sodium: 134 mEq/L — ABNORMAL LOW (ref 135–145)
Total Protein: 6.6 g/dL (ref 6.0–8.3)

## 2011-08-26 LAB — HEMOGLOBIN A1C: Hgb A1c MFr Bld: 5.6 % (ref 4.6–6.5)

## 2011-08-26 MED ORDER — SIMVASTATIN 40 MG PO TABS: ORAL_TABLET | ORAL | Status: DC

## 2011-08-26 MED ORDER — METOPROLOL TARTRATE 25 MG PO TABS: ORAL_TABLET | ORAL | Status: DC

## 2011-08-26 MED ORDER — LOSARTAN POTASSIUM-HCTZ 100-12.5 MG PO TABS: 1.0000 | ORAL_TABLET | Freq: Every day | ORAL | Status: DC

## 2011-08-26 MED ORDER — NAPROXEN 500 MG PO TABS: ORAL_TABLET | ORAL | Status: DC

## 2011-08-26 NOTE — Patient Instructions (Signed)
Health Maintenance, Males A healthy lifestyle and preventative care can promote health and wellness.  Maintain regular health, dental, and eye exams.   Eat a healthy diet. Foods like vegetables, fruits, whole grains, low-fat dairy products, and lean protein foods contain the nutrients you need without too many calories. Decrease your intake of foods high in solid fats, added sugars, and salt. Get information about a proper diet from your caregiver, if necessary.   Regular physical exercise is one of the most important things you can do for your health. Most adults should get at least 150 minutes of moderate-intensity exercise (any activity that increases your heart rate and causes you to sweat) each week. In addition, most adults need muscle-strengthening exercises on 2 or more days a week.    Maintain a healthy weight. The body mass index (BMI) is a screening tool to identify possible weight problems. It provides an estimate of body fat based on height and weight. Your caregiver can help determine your BMI, and can help you achieve or maintain a healthy weight. For adults 20 years and older:   A BMI below 18.5 is considered underweight.   A BMI of 18.5 to 24.9 is normal.   A BMI of 25 to 29.9 is considered overweight.   A BMI of 30 and above is considered obese.   Maintain normal blood lipids and cholesterol by exercising and minimizing your intake of saturated fat. Eat a balanced diet with plenty of fruits and vegetables. Blood tests for lipids and cholesterol should begin at age 20 and be repeated every 5 years. If your lipid or cholesterol levels are high, you are over 50, or you are a high risk for heart disease, you may need your cholesterol levels checked more frequently.Ongoing high lipid and cholesterol levels should be treated with medicines, if diet and exercise are not effective.   If you smoke, find out from your caregiver how to quit. If you do not use tobacco, do not start.    If you choose to drink alcohol, do not exceed 2 drinks per day. One drink is considered to be 12 ounces (355 mL) of beer, 5 ounces (148 mL) of wine, or 1.5 ounces (44 mL) of liquor.   Avoid use of street drugs. Do not share needles with anyone. Ask for help if you need support or instructions about stopping the use of drugs.   High blood pressure causes heart disease and increases the risk of stroke. Blood pressure should be checked at least every 1 to 2 years. Ongoing high blood pressure should be treated with medicines if weight loss and exercise are not effective.   If you are 45 to 70 years old, ask your caregiver if you should take aspirin to prevent heart disease.   Diabetes screening involves taking a blood sample to check your fasting blood sugar level. This should be done once every 3 years, after age 45, if you are within normal weight and without risk factors for diabetes. Testing should be considered at a younger age or be carried out more frequently if you are overweight and have at least 1 risk factor for diabetes.   Colorectal cancer can be detected and often prevented. Most routine colorectal cancer screening begins at the age of 50 and continues through age 75. However, your caregiver may recommend screening at an earlier age if you have risk factors for colon cancer. On a yearly basis, your caregiver may provide home test kits to check for hidden   blood in the stool. Use of a small camera at the end of a tube, to directly examine the colon (sigmoidoscopy or colonoscopy), can detect the earliest forms of colorectal cancer. Talk to your caregiver about this at age 50, when routine screening begins. Direct examination of the colon should be repeated every 5 to 10 years through age 75, unless early forms of pre-cancerous polyps or small growths are found.   Healthy men should no longer receive prostate-specific antigen (PSA) blood tests as part of routine cancer screening. Consult with  your caregiver about prostate cancer screening.   Practice safe sex. Use condoms and avoid high-risk sexual practices to reduce the spread of sexually transmitted infections (STIs).   Use sunscreen with a sun protection factor (SPF) of 30 or greater. Apply sunscreen liberally and repeatedly throughout the day. You should seek shade when your shadow is shorter than you. Protect yourself by wearing long sleeves, pants, a wide-brimmed hat, and sunglasses year round, whenever you are outdoors.   Notify your caregiver of new moles or changes in moles, especially if there is a change in shape or color. Also notify your caregiver if a mole is larger than the size of a pencil eraser.   A one-time screening for abdominal aortic aneurysm (AAA) and surgical repair of large AAAs by sound wave imaging (ultrasonography) is recommended for ages 65 to 75 years who are current or former smokers.   Stay current with your immunizations.  Document Released: 01/29/2008 Document Revised: 04/14/2011 Document Reviewed: 12/28/2010 [employer] Patient Information 2012 [employer]. 

## 2011-08-29 ENCOUNTER — Encounter: Payer: Self-pay | Admitting: Internal Medicine

## 2011-08-29 DIAGNOSIS — Z0000 Encounter for general adult medical examination without abnormal findings: Secondary | ICD-10-CM

## 2011-08-29 DIAGNOSIS — Z136 Encounter for screening for cardiovascular disorders: Secondary | ICD-10-CM | POA: Insufficient documentation

## 2011-08-29 HISTORY — DX: Encounter for general adult medical examination without abnormal findings: Z00.00

## 2011-08-29 NOTE — Assessment & Plan Note (Signed)
By exam and symptoms he is doing well, I will check his PSA today 

## 2011-08-29 NOTE — Assessment & Plan Note (Signed)
I will check his a1c today 

## 2011-08-29 NOTE — Assessment & Plan Note (Signed)
His EKG is normal today 

## 2011-08-29 NOTE — Assessment & Plan Note (Signed)
He needs a cardiology f/up so I made that referral today

## 2011-08-29 NOTE — Assessment & Plan Note (Signed)
Bp is well controlled, I will check his lytes and renal function

## 2011-08-29 NOTE — Progress Notes (Signed)
Subjective:    Patient ID: Jack Farmer, male    DOB: [birth year], 71 y.o.   MRN: [id number]  Hypertension This is a chronic problem. The current episode started more than 1 year ago. The problem has been gradually improving since onset. The problem is controlled. Pertinent negatives include no anxiety, blurred vision, chest pain, headaches, malaise/fatigue, neck pain, orthopnea, palpitations, peripheral edema, PND, shortness of breath or sweats. There are no associated agents to hypertension. Past treatments include diuretics and angiotensin blockers. The current treatment provides significant improvement. Compliance problems include exercise and diet.  Hypertensive end-organ damage includes CAD/MI. There is no history of chronic renal disease.  Hyperlipidemia This is a chronic problem. The current episode started more than 1 year ago. The problem is controlled. Recent lipid tests were reviewed and are variable. Exacerbating diseases include obesity. He has no history of chronic renal disease, diabetes, hypothyroidism, liver disease or nephrotic syndrome. Factors aggravating his hyperlipidemia include fatty foods. Pertinent negatives include no chest pain, focal sensory loss, focal weakness, leg pain, myalgias or shortness of breath. Current antihyperlipidemic treatment includes statins. The current treatment provides moderate improvement of lipids. Compliance problems include adherence to exercise and adherence to diet.       Review of Systems  Constitutional: Negative for fever, chills, malaise/fatigue, diaphoresis, activity change, appetite change, fatigue and unexpected weight change.  HENT: Negative.  Negative for neck pain.   Eyes: Negative.  Negative for blurred vision.  Respiratory: Negative for apnea, cough, chest tightness, shortness of breath, wheezing and stridor.   Cardiovascular: Negative for chest pain, palpitations, orthopnea, leg swelling and PND.  Gastrointestinal: Negative for  nausea, vomiting, abdominal pain, diarrhea, constipation and abdominal distention.  Genitourinary: Negative for dysuria, urgency, frequency, hematuria, flank pain, decreased urine volume, enuresis, difficulty urinating and testicular pain.  Musculoskeletal: Negative for myalgias, back pain, joint swelling, arthralgias and gait problem.  Skin: Negative for color change, pallor, rash and wound.  Neurological: Negative for dizziness, tremors, focal weakness, seizures, syncope, facial asymmetry, speech difficulty, weakness, light-headedness, numbness and headaches.  Hematological: Negative for adenopathy. Does not bruise/bleed easily.  Psychiatric/Behavioral: Negative.        Objective:   Physical Exam  Vitals reviewed. Constitutional: He is oriented to person, place, and time. He appears well-developed and well-nourished. No distress.  HENT:  Head: Normocephalic and atraumatic.  Mouth/Throat: Oropharynx is clear and moist. No oropharyngeal exudate.  Eyes: Conjunctivae are normal. Right eye exhibits no discharge. Left eye exhibits no discharge. No scleral icterus.  Neck: Normal range of motion. Neck supple. No JVD present. No tracheal deviation present. No thyromegaly present.  Cardiovascular: Normal rate, regular rhythm, normal heart sounds and intact distal pulses.  Exam reveals no gallop and no friction rub.   No murmur heard. Pulmonary/Chest: Effort normal and breath sounds normal. No stridor. No respiratory distress. He has no wheezes. He has no rales. He exhibits no tenderness.  Abdominal: Soft. Bowel sounds are normal. He exhibits no distension and no mass. There is no tenderness. There is no rebound and no guarding. Hernia confirmed negative in the right inguinal area and confirmed negative in the left inguinal area.  Genitourinary: Rectum normal, testes normal and penis normal. Rectal exam shows no external hemorrhoid, no internal hemorrhoid, no fissure, no mass, no tenderness and anal  tone normal. Guaiac negative stool. 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 Prostate is not tender. Right testis shows no mass, no swelling and no tenderness. Right testis is descended. Left testis shows no  mass, no swelling and no tenderness. Left testis is descended. Circumcised. No penile tenderness. No discharge found.  Musculoskeletal: Normal range of motion. He exhibits no edema and no tenderness.  Lymphadenopathy:    He has no cervical adenopathy.       Right: No inguinal adenopathy present.       Left: No inguinal adenopathy present.  Neurological: He is oriented to person, place, and time.  Skin: Skin is warm and dry. No rash noted. He is not diaphoretic. No erythema. No pallor.  Psychiatric: He has a normal mood and affect. His behavior is normal. Judgment and thought content normal.          Assessment & Plan:

## 2011-08-29 NOTE — Assessment & Plan Note (Signed)
He is doing well on zocor, I will check his labs today 

## 2011-09-06 ENCOUNTER — Ambulatory Visit: Payer: Medicare Other | Admitting: Internal Medicine

## 2011-09-09 ENCOUNTER — Other Ambulatory Visit: Payer: Self-pay | Admitting: Internal Medicine

## 2011-09-09 MED ORDER — SILODOSIN 8 MG PO CAPS: 8.0000 mg | ORAL_CAPSULE | Freq: Every day | ORAL | Status: DC

## 2012-03-14 ENCOUNTER — Other Ambulatory Visit (HOSPITAL_COMMUNITY): Payer: Self-pay | Admitting: Neurological Surgery

## 2012-03-14 DIAGNOSIS — M545 Low back pain: Secondary | ICD-10-CM

## 2012-03-16 ENCOUNTER — Ambulatory Visit (HOSPITAL_COMMUNITY)
Admission: RE | Admit: 2012-03-16 | Discharge: 2012-03-16 | Disposition: A | Discharge status: Home / Self Care | Payer: Medicare Other | Source: Ambulatory Visit | Attending: Neurological Surgery | Admitting: Neurological Surgery

## 2012-03-16 DIAGNOSIS — M545 Low back pain, unspecified: Secondary | ICD-10-CM

## 2012-03-16 DIAGNOSIS — M51379 Other intervertebral disc degeneration, lumbosacral region without mention of lumbar back pain or lower extremity pain: Secondary | ICD-10-CM | POA: Insufficient documentation

## 2012-03-16 DIAGNOSIS — M47817 Spondylosis without myelopathy or radiculopathy, lumbosacral region: Secondary | ICD-10-CM | POA: Insufficient documentation

## 2012-03-16 DIAGNOSIS — M5137 Other intervertebral disc degeneration, lumbosacral region: Secondary | ICD-10-CM | POA: Insufficient documentation

## 2012-03-20 ENCOUNTER — Other Ambulatory Visit: Payer: Self-pay | Admitting: Neurological Surgery

## 2012-03-23 ENCOUNTER — Encounter (HOSPITAL_COMMUNITY): Payer: Self-pay | Admitting: Pharmacy Technician

## 2012-03-31 ENCOUNTER — Encounter (HOSPITAL_COMMUNITY)
Admission: RE | Admit: 2012-03-31 | Discharge: 2012-03-31 | Disposition: A | Discharge status: Home / Self Care | Payer: Medicare Other | Source: Ambulatory Visit | Attending: Neurological Surgery | Admitting: Neurological Surgery

## 2012-03-31 ENCOUNTER — Encounter (HOSPITAL_COMMUNITY): Payer: Self-pay

## 2012-03-31 LAB — BASIC METABOLIC PANEL
GFR calc Af Amer: 77 mL/min — ABNORMAL LOW (ref >90)
GFR calc non Af Amer: 66 mL/min — ABNORMAL LOW (ref >90)
Potassium: 4.1 mEq/L (ref 3.5–5.1)
Sodium: 139 mEq/L (ref 135–145)

## 2012-03-31 LAB — CBC WITH DIFFERENTIAL/PLATELET
Basophils Relative: 1 % (ref 0–1)
Hemoglobin: 16.5 g/dL (ref 13.0–17.0)
Lymphs Abs: 2 10*3/uL (ref 0.7–4.0)
Monocytes Relative: 9 % (ref 3–12)
Neutro Abs: 3.2 10*3/uL (ref 1.7–7.7)
Neutrophils Relative %: 55 % (ref 43–77)
Platelets: 168 10*3/uL (ref 150–400)
RBC: 5.07 MIL/uL (ref 4.22–5.81)

## 2012-03-31 LAB — PROTIME-INR
INR: 0.9 (ref 0.00–1.49)
Prothrombin Time: 12.3 seconds (ref 11.6–15.2)

## 2012-03-31 LAB — ABO/RH: ABO/RH(D): B POS

## 2012-03-31 LAB — TYPE AND SCREEN
ABO/RH(D): B POS
Antibody Screen: NEGATIVE

## 2012-03-31 NOTE — Pre-Procedure Instructions (Signed)
[address]   03/31/2012   Your procedure is scheduled on:  Friday, August 23rd              Surgery Time is from 11:15 - 15:24   Report to [facility] at  9:15 AM.  Call this number if you have problems the morning of surgery: [phone]   Remember:   Do not eat food or drink any liquids :After Midnight Thursday.  Take these medicines the morning of surgery with A SIP OF WATER: Pain med, Lopressor              Robaxin (if needed)   Do not wear jewelry. Do not wear lotions, powders, or perfumes. You may NOT wear deodorant.  Ladies -- Do not shave 48 hours prior to surgery. Men may shave face and neck.   Do not bring valuables to the hospital.  Contacts, dentures or bridgework may not be worn into surgery.   Leave suitcase in the car. After surgery it may be brought to your room.  For patients admitted to the hospital, checkout time is 11:00 AM the day of discharge.   Patients discharged the day of surgery will not be allowed to drive home.  Name and phone number of your driver:     Special Instructions: CHG Shower Use Special Wash: 1/2 bottle night before surgery and 1/2 bottle morning of surgery.   Please read over the following fact sheets that you were given: Pain Booklet, Coughing and Deep Breathing, Blood Transfusion Information, MRSA Information and Surgical Site Infection Prevention

## 2012-03-31 NOTE — Progress Notes (Signed)
PATIENT GOES BY THE NAME OF ''Jack Farmer''............DA

## 2012-04-06 MED ORDER — DEXAMETHASONE SODIUM PHOSPHATE 10 MG/ML IJ SOLN
10.0000 mg | INTRAMUSCULAR | Status: AC
  Administered 2012-04-07: 10 mg via INTRAVENOUS
  Filled 2012-04-06: qty 1

## 2012-04-06 MED ORDER — VANCOMYCIN HCL 1000 MG IV SOLR
1500.0000 mg | INTRAVENOUS | Status: AC
  Administered 2012-04-07: 1500 mg via INTRAVENOUS
  Filled 2012-04-06: qty 1500

## 2012-04-07 ENCOUNTER — Encounter (HOSPITAL_COMMUNITY): Payer: Self-pay | Admitting: *Deleted

## 2012-04-07 ENCOUNTER — Inpatient Hospital Stay (HOSPITAL_COMMUNITY)
Admission: RE | Admit: 2012-04-07 | Discharge: 2012-04-10 | DRG: 458 | Disposition: A | Discharge status: Home / Self Care | Payer: Medicare Other | Source: Ambulatory Visit | Attending: Neurological Surgery | Admitting: Neurological Surgery

## 2012-04-07 ENCOUNTER — Inpatient Hospital Stay (HOSPITAL_COMMUNITY): Payer: Medicare Other | Admitting: *Deleted

## 2012-04-07 ENCOUNTER — Encounter (HOSPITAL_COMMUNITY)
Admission: RE | Disposition: A | Discharge status: Home / Self Care | Payer: Self-pay | Source: Ambulatory Visit | Attending: Neurological Surgery

## 2012-04-07 ENCOUNTER — Inpatient Hospital Stay (HOSPITAL_COMMUNITY): Payer: Medicare Other

## 2012-04-07 DIAGNOSIS — M5137 Other intervertebral disc degeneration, lumbosacral region: Secondary | ICD-10-CM | POA: Diagnosis present

## 2012-04-07 DIAGNOSIS — M51379 Other intervertebral disc degeneration, lumbosacral region without mention of lumbar back pain or lower extremity pain: Secondary | ICD-10-CM | POA: Diagnosis present

## 2012-04-07 DIAGNOSIS — I2510 Atherosclerotic heart disease of native coronary artery without angina pectoris: Secondary | ICD-10-CM | POA: Diagnosis present

## 2012-04-07 DIAGNOSIS — J45909 Unspecified asthma, uncomplicated: Secondary | ICD-10-CM | POA: Diagnosis present

## 2012-04-07 DIAGNOSIS — K219 Gastro-esophageal reflux disease without esophagitis: Secondary | ICD-10-CM | POA: Diagnosis present

## 2012-04-07 DIAGNOSIS — Z981 Arthrodesis status: Secondary | ICD-10-CM

## 2012-04-07 DIAGNOSIS — I10 Essential (primary) hypertension: Secondary | ICD-10-CM | POA: Diagnosis present

## 2012-04-07 DIAGNOSIS — M47817 Spondylosis without myelopathy or radiculopathy, lumbosacral region: Secondary | ICD-10-CM | POA: Diagnosis present

## 2012-04-07 DIAGNOSIS — M4120 Other idiopathic scoliosis, site unspecified: Principal | ICD-10-CM | POA: Diagnosis present

## 2012-04-07 HISTORY — PX: ANTERIOR LAT LUMBAR FUSION: SHX1168

## 2012-04-07 SURGERY — ANTERIOR LATERAL LUMBAR FUSION 2 LEVELS
Anesthesia: General | Site: Spine Lumbar | Laterality: Right | Wound class: Clean

## 2012-04-07 MED ORDER — ACETAMINOPHEN 10 MG/ML IV SOLN
INTRAVENOUS | Status: AC
  Administered 2012-04-07: 1000 mg via INTRAVENOUS
  Filled 2012-04-07: qty 100

## 2012-04-07 MED ORDER — LACTATED RINGERS IV SOLN
INTRAVENOUS | Status: DC | PRN
  Administered 2012-04-07 (×3): via INTRAVENOUS

## 2012-04-07 MED ORDER — OXYCODONE HCL 5 MG PO TABS: 5.0000 mg | ORAL_TABLET | Freq: Once | ORAL | Status: DC | PRN

## 2012-04-07 MED ORDER — KETOROLAC TROMETHAMINE 15 MG/ML IJ SOLN
INTRAMUSCULAR | Status: DC | PRN
  Administered 2012-04-07: 15 mg via INTRAVENOUS

## 2012-04-07 MED ORDER — ASPIRIN EC 81 MG PO TBEC
81.0000 mg | DELAYED_RELEASE_TABLET | Freq: Every day | ORAL | Status: DC
  Administered 2012-04-07 – 2012-04-10 (×4): 81 mg via ORAL
  Filled 2012-04-07 (×4): qty 1

## 2012-04-07 MED ORDER — SODIUM CHLORIDE 0.9 % IV SOLN: 250.0000 mL | INTRAVENOUS | Status: DC

## 2012-04-07 MED ORDER — METHOCARBAMOL 500 MG PO TABS
500.0000 mg | ORAL_TABLET | Freq: Four times a day (QID) | ORAL | Status: DC | PRN
  Administered 2012-04-07 – 2012-04-09 (×5): 500 mg via ORAL
  Filled 2012-04-07 (×5): qty 1

## 2012-04-07 MED ORDER — METOPROLOL TARTRATE 25 MG PO TABS
25.0000 mg | ORAL_TABLET | Freq: Two times a day (BID) | ORAL | Status: DC
  Administered 2012-04-07 – 2012-04-10 (×5): 25 mg via ORAL
  Filled 2012-04-07 (×7): qty 1

## 2012-04-07 MED ORDER — LIDOCAINE HCL 4 % MT SOLN
OROMUCOSAL | Status: DC | PRN
  Administered 2012-04-07: 4 mL via TOPICAL

## 2012-04-07 MED ORDER — MIDAZOLAM HCL 5 MG/5ML IJ SOLN
INTRAMUSCULAR | Status: DC | PRN
  Administered 2012-04-07: 2 mg via INTRAVENOUS

## 2012-04-07 MED ORDER — HYDROCHLOROTHIAZIDE 12.5 MG PO CAPS
12.5000 mg | ORAL_CAPSULE | Freq: Every day | ORAL | Status: DC
  Administered 2012-04-07 – 2012-04-10 (×4): 12.5 mg via ORAL
  Filled 2012-04-07 (×4): qty 1

## 2012-04-07 MED ORDER — EPHEDRINE SULFATE 50 MG/ML IJ SOLN
INTRAMUSCULAR | Status: DC | PRN
  Administered 2012-04-07: 10 mg via INTRAVENOUS

## 2012-04-07 MED ORDER — HYDROMORPHONE HCL PF 1 MG/ML IJ SOLN
0.2500 mg | INTRAMUSCULAR | Status: DC | PRN
  Administered 2012-04-07 (×4): 0.5 mg via INTRAVENOUS

## 2012-04-07 MED ORDER — OXYCODONE-ACETAMINOPHEN 5-325 MG PO TABS
1.0000 | ORAL_TABLET | ORAL | Status: DC | PRN
  Administered 2012-04-07 – 2012-04-08 (×4): 2 via ORAL
  Administered 2012-04-08: 1 via ORAL
  Administered 2012-04-08 – 2012-04-09 (×5): 2 via ORAL
  Filled 2012-04-07 (×10): qty 2

## 2012-04-07 MED ORDER — PROPOFOL 10 MG/ML IV EMUL
INTRAVENOUS | Status: DC | PRN
  Administered 2012-04-07 (×2): 20 mg via INTRAVENOUS
  Administered 2012-04-07: 160 mg via INTRAVENOUS

## 2012-04-07 MED ORDER — 0.9 % SODIUM CHLORIDE (POUR BTL) OPTIME
TOPICAL | Status: DC | PRN
  Administered 2012-04-07: 1000 mL

## 2012-04-07 MED ORDER — HYDROMORPHONE HCL PF 1 MG/ML IJ SOLN
INTRAMUSCULAR | Status: AC
  Filled 2012-04-07: qty 1

## 2012-04-07 MED ORDER — SODIUM CHLORIDE 0.9 % IJ SOLN: 3.0000 mL | INTRAMUSCULAR | Status: DC | PRN

## 2012-04-07 MED ORDER — MORPHINE SULFATE 10 MG/ML IJ SOLN
INTRAMUSCULAR | Status: DC | PRN
  Administered 2012-04-07 (×2): 5 mg via INTRAVENOUS

## 2012-04-07 MED ORDER — MENTHOL 3 MG MT LOZG: 1.0000 | LOZENGE | OROMUCOSAL | Status: DC | PRN

## 2012-04-07 MED ORDER — VANCOMYCIN HCL IN DEXTROSE 1-5 GM/200ML-% IV SOLN
1000.0000 mg | Freq: Once | INTRAVENOUS | Status: AC
  Administered 2012-04-07: 1000 mg via INTRAVENOUS
  Filled 2012-04-07: qty 200

## 2012-04-07 MED ORDER — DROPERIDOL 2.5 MG/ML IJ SOLN: 0.6250 mg | INTRAMUSCULAR | Status: DC | PRN

## 2012-04-07 MED ORDER — SODIUM CHLORIDE 0.9 % IV SOLN
INTRAVENOUS | Status: DC | PRN
  Administered 2012-04-07: 12:00:00 via INTRAVENOUS

## 2012-04-07 MED ORDER — SODIUM CHLORIDE 0.9 % IJ SOLN
3.0000 mL | Freq: Two times a day (BID) | INTRAMUSCULAR | Status: DC
  Administered 2012-04-08: 3 mL via INTRAVENOUS
  Administered 2012-04-09: 23:00:00 via INTRAVENOUS
  Administered 2012-04-09 – 2012-04-10 (×2): 3 mL via INTRAVENOUS

## 2012-04-07 MED ORDER — BUPIVACAINE HCL (PF) 0.25 % IJ SOLN
INTRAMUSCULAR | Status: DC | PRN
  Administered 2012-04-07: 4 mL

## 2012-04-07 MED ORDER — ACETAMINOPHEN 325 MG PO TABS: 650.0000 mg | ORAL_TABLET | ORAL | Status: DC | PRN

## 2012-04-07 MED ORDER — POTASSIUM CHLORIDE IN NACL 20-0.9 MEQ/L-% IV SOLN
INTRAVENOUS | Status: DC
  Filled 2012-04-07 (×7): qty 1000

## 2012-04-07 MED ORDER — OXYCODONE HCL 5 MG/5ML PO SOLN: 5.0000 mg | Freq: Once | ORAL | Status: DC | PRN

## 2012-04-07 MED ORDER — DEXAMETHASONE SODIUM PHOSPHATE 4 MG/ML IJ SOLN
4.0000 mg | Freq: Four times a day (QID) | INTRAMUSCULAR | Status: DC
Start: 2012-04-07 — End: 2012-04-10
  Administered 2012-04-08 – 2012-04-09 (×2): 4 mg via INTRAVENOUS
  Filled 2012-04-07 (×15): qty 1

## 2012-04-07 MED ORDER — PHENOL 1.4 % MT LIQD: 1.0000 | OROMUCOSAL | Status: DC | PRN

## 2012-04-07 MED ORDER — ACETAMINOPHEN 10 MG/ML IV SOLN
1000.0000 mg | Freq: Four times a day (QID) | INTRAVENOUS | Status: AC
  Administered 2012-04-07 – 2012-04-08 (×3): 1000 mg via INTRAVENOUS
  Filled 2012-04-07 (×4): qty 100

## 2012-04-07 MED ORDER — METHOCARBAMOL 100 MG/ML IJ SOLN
500.0000 mg | Freq: Four times a day (QID) | INTRAVENOUS | Status: DC | PRN
  Filled 2012-04-07: qty 5

## 2012-04-07 MED ORDER — FENTANYL CITRATE 0.05 MG/ML IJ SOLN
INTRAMUSCULAR | Status: DC | PRN
  Administered 2012-04-07: 50 ug via INTRAVENOUS
  Administered 2012-04-07: 100 ug via INTRAVENOUS
  Administered 2012-04-07: 50 ug via INTRAVENOUS
  Administered 2012-04-07: 100 ug via INTRAVENOUS
  Administered 2012-04-07 (×2): 50 ug via INTRAVENOUS
  Administered 2012-04-07: 100 ug via INTRAVENOUS

## 2012-04-07 MED ORDER — ONDANSETRON HCL 4 MG/2ML IJ SOLN: 4.0000 mg | INTRAMUSCULAR | Status: DC | PRN

## 2012-04-07 MED ORDER — DEXAMETHASONE 4 MG PO TABS
4.0000 mg | ORAL_TABLET | Freq: Four times a day (QID) | ORAL | Status: DC
  Administered 2012-04-07 – 2012-04-10 (×9): 4 mg via ORAL
  Filled 2012-04-07 (×15): qty 1

## 2012-04-07 MED ORDER — SODIUM CHLORIDE 0.9 % IR SOLN
Status: DC | PRN
  Administered 2012-04-07: 13:00:00

## 2012-04-07 MED ORDER — MORPHINE SULFATE 2 MG/ML IJ SOLN
1.0000 mg | INTRAMUSCULAR | Status: DC | PRN
  Administered 2012-04-07 – 2012-04-08 (×2): 2 mg via INTRAVENOUS
  Filled 2012-04-07 (×2): qty 1

## 2012-04-07 MED ORDER — METHOCARBAMOL 100 MG/ML IJ SOLN
500.0000 mg | INTRAVENOUS | Status: AC
  Administered 2012-04-07: 500 mg via INTRAVENOUS
  Filled 2012-04-07: qty 5

## 2012-04-07 MED ORDER — ACETAMINOPHEN 650 MG RE SUPP: 650.0000 mg | RECTAL | Status: DC | PRN

## 2012-04-07 MED ORDER — LOSARTAN POTASSIUM 50 MG PO TABS
100.0000 mg | ORAL_TABLET | Freq: Every day | ORAL | Status: DC
Start: 2012-04-07 — End: 2012-04-10
  Administered 2012-04-07 – 2012-04-10 (×4): 100 mg via ORAL
  Filled 2012-04-07 (×4): qty 2

## 2012-04-07 MED ORDER — PHENYLEPHRINE HCL 10 MG/ML IJ SOLN
INTRAMUSCULAR | Status: DC | PRN
  Administered 2012-04-07 (×2): 40 ug via INTRAVENOUS
  Administered 2012-04-07: 80 ug via INTRAVENOUS

## 2012-04-07 MED ORDER — BACITRACIN 50000 UNITS IM SOLR
INTRAMUSCULAR | Status: AC
  Filled 2012-04-07: qty 1

## 2012-04-07 MED ORDER — ONDANSETRON HCL 4 MG/2ML IJ SOLN
INTRAMUSCULAR | Status: DC | PRN
  Administered 2012-04-07: 4 mg via INTRAVENOUS

## 2012-04-07 MED ORDER — LOSARTAN POTASSIUM-HCTZ 100-12.5 MG PO TABS: 1.0000 | ORAL_TABLET | Freq: Every day | ORAL | Status: DC

## 2012-04-07 MED ORDER — SODIUM CHLORIDE 0.9 % IV SOLN
INTRAVENOUS | Status: AC
  Filled 2012-04-07: qty 500

## 2012-04-07 MED ORDER — CEFAZOLIN SODIUM 1-5 GM-% IV SOLN: 1.0000 g | Freq: Three times a day (TID) | INTRAVENOUS | Status: DC

## 2012-04-07 MED ORDER — SUCCINYLCHOLINE CHLORIDE 20 MG/ML IJ SOLN
INTRAMUSCULAR | Status: DC | PRN
  Administered 2012-04-07: 100 mg via INTRAVENOUS

## 2012-04-07 MED ORDER — THROMBIN 5000 UNITS EX SOLR
OROMUCOSAL | Status: DC | PRN
  Administered 2012-04-07: 14:00:00 via TOPICAL

## 2012-04-07 SURGICAL SUPPLY — 80 items
ADH SKN CLS APL DERMABOND .7 (GAUZE/BANDAGES/DRESSINGS) ×6
APL SKNCLS STERI-STRIP NONHPOA (GAUZE/BANDAGES/DRESSINGS) ×2
BAG DECANTER FOR FLEXI CONT (MISCELLANEOUS) ×5 IMPLANT
BENZOIN TINCTURE PRP APPL 2/3 (GAUZE/BANDAGES/DRESSINGS) ×5 IMPLANT
BLADE SURG ROTATE 9660 (MISCELLANEOUS) IMPLANT
BONE MATRIX OSTEOCEL PLUS 10CC (Bone Implant) ×1 IMPLANT
BRUSH SCRUB EZ PLAIN DRY (MISCELLANEOUS) ×3 IMPLANT
CLOTH BEACON ORANGE TIMEOUT ST (SAFETY) ×5 IMPLANT
CONT SPEC 4OZ CLIKSEAL STRL BL (MISCELLANEOUS) IMPLANT
CORENT WIDE 10X22X55 (Orthopedic Implant) ×6 IMPLANT
COROENT WIDE 10X22X55 (Orthopedic Implant) IMPLANT
COVER BACK TABLE 24X17X13 BIG (DRAPES) IMPLANT
COVER TABLE BACK 60X90 (DRAPES) ×3 IMPLANT
DERMABOND ADVANCED (GAUZE/BANDAGES/DRESSINGS) ×3
DERMABOND ADVANCED .7 DNX12 (GAUZE/BANDAGES/DRESSINGS) ×2 IMPLANT
DRAPE C-ARM 42X72 X-RAY (DRAPES) ×6 IMPLANT
DRAPE C-ARMOR (DRAPES) ×5 IMPLANT
DRAPE LAPAROTOMY 100X72X124 (DRAPES) ×5 IMPLANT
DRAPE POUCH INSTRU U-SHP 10X18 (DRAPES) ×3 IMPLANT
DRAPE SURG 17X23 STRL (DRAPES) ×14 IMPLANT
DRESSING TELFA 8X3 (GAUZE/BANDAGES/DRESSINGS) ×4 IMPLANT
DRSG OPSITE 4X5.5 SM (GAUZE/BANDAGES/DRESSINGS) ×6 IMPLANT
DURAPREP 26ML APPLICATOR (WOUND CARE) ×5 IMPLANT
ELECT BLADE 4.0 EZ CLEAN MEGAD (MISCELLANEOUS)
ELECT REM PT RETURN 9FT ADLT (ELECTROSURGICAL) ×3
ELECTRODE BLDE 4.0 EZ CLN MEGD (MISCELLANEOUS) IMPLANT
ELECTRODE REM PT RTRN 9FT ADLT (ELECTROSURGICAL) ×4 IMPLANT
EVACUATOR 1/8 PVC DRAIN (DRAIN) IMPLANT
GAUZE SPONGE 4X4 16PLY XRAY LF (GAUZE/BANDAGES/DRESSINGS) IMPLANT
GLOVE BIO SURGEON STRL SZ8 (GLOVE) ×6 IMPLANT
GLOVE BIOGEL PI IND STRL 7.0 (GLOVE) IMPLANT
GLOVE BIOGEL PI INDICATOR 7.0 (GLOVE) ×3
GLOVE ECLIPSE 6.5 STRL STRAW (GLOVE) ×1 IMPLANT
GLOVE SS BIOGEL STRL SZ 6.5 (GLOVE) IMPLANT
GLOVE SUPERSENSE BIOGEL SZ 6.5 (GLOVE) ×2
GLOVE SURG SS PI 7.0 STRL IVOR (GLOVE) ×3 IMPLANT
GOWN BRE IMP SLV AUR LG STRL (GOWN DISPOSABLE) ×3 IMPLANT
GOWN BRE IMP SLV AUR XL STRL (GOWN DISPOSABLE) ×2 IMPLANT
GOWN STRL REIN 2XL LVL4 (GOWN DISPOSABLE) ×2 IMPLANT
GUIDEWIRE NITINOL BEVEL TIP (WIRE) ×3 IMPLANT
HEMOSTAT POWDER KIT SURGIFOAM (HEMOSTASIS) IMPLANT
HEMOSTAT POWDER SURGIFOAM 1G (HEMOSTASIS) ×1 IMPLANT
KIT BASIN OR (CUSTOM PROCEDURE TRAY) ×6 IMPLANT
KIT DILATOR XLIF 5 (KITS) IMPLANT
KIT MAXCESS (KITS) ×1 IMPLANT
KIT NDL NVM5 EMG ELECT (KITS) IMPLANT
KIT NEEDLE NVM5 EMG ELECT (KITS) ×2 IMPLANT
KIT NEEDLE NVM5 EMG ELECTRODE (KITS) ×1
KIT ROOM TURNOVER OR (KITS) ×5 IMPLANT
KIT XLIF (KITS) ×1
MIX DBX 10CC 35% BONE (Bone Implant) ×1 IMPLANT
NDL HYPO 25X1 1.5 SAFETY (NEEDLE) ×2 IMPLANT
NDL I PASS (NEEDLE) IMPLANT
NDL I-PASS III (NEEDLE) IMPLANT
NEEDLE HYPO 22GX1.5 SAFETY (NEEDLE) ×2 IMPLANT
NEEDLE HYPO 25X1 1.5 SAFETY (NEEDLE) ×3 IMPLANT
NEEDLE I PASS (NEEDLE) ×3 IMPLANT
NEEDLE I-PASS III (NEEDLE) ×3 IMPLANT
NS IRRIG 1000ML POUR BTL (IV SOLUTION) ×5 IMPLANT
PACK LAMINECTOMY NEURO (CUSTOM PROCEDURE TRAY) ×5 IMPLANT
ROD PRECEPT PRE-BENT 80MM (Rod) ×1 IMPLANT
SCREW POLYAXIAL 6.5X45MM (Screw) ×2 IMPLANT
SCREW PRECEPT 6.5X50 (Screw) ×1 IMPLANT
SCREW PRECEPT SET (Screw) ×3 IMPLANT
SPONGE GAUZE 4X4 12PLY (GAUZE/BANDAGES/DRESSINGS) ×2 IMPLANT
SPONGE LAP 4X18 X RAY DECT (DISPOSABLE) IMPLANT
SPONGE SURGIFOAM ABS GEL SZ50 (HEMOSTASIS) IMPLANT
STAPLER SKIN PROX WIDE 3.9 (STAPLE) ×1 IMPLANT
STRIP CLOSURE SKIN 1/2X4 (GAUZE/BANDAGES/DRESSINGS) ×3 IMPLANT
SUT VIC AB 0 CT1 18XCR BRD8 (SUTURE) ×6 IMPLANT
SUT VIC AB 0 CT1 8-18 (SUTURE) ×6
SUT VIC AB 2-0 CP2 18 (SUTURE) ×8 IMPLANT
SUT VIC AB 3-0 SH 8-18 (SUTURE) ×9 IMPLANT
SYR 20ML ECCENTRIC (SYRINGE) ×5 IMPLANT
TAPE CLOTH 3X10 TAN LF (GAUZE/BANDAGES/DRESSINGS) ×9 IMPLANT
TOWEL OR 17X24 6PK STRL BLUE (TOWEL DISPOSABLE) ×5 IMPLANT
TOWEL OR 17X26 10 PK STRL BLUE (TOWEL DISPOSABLE) ×5 IMPLANT
TRAP SPECIMEN MUCOUS 40CC (MISCELLANEOUS) IMPLANT
TRAY FOLEY CATH 14FRSI W/METER (CATHETERS) ×3 IMPLANT
WATER STERILE IRR 1000ML POUR (IV SOLUTION) ×5 IMPLANT

## 2012-04-07 NOTE — Anesthesia Preprocedure Evaluation (Addendum)
Anesthesia Evaluation  Patient identified by MRN, date of birth, ID band Patient awake    Reviewed: Allergy & Precautions, H&P , NPO status , Patient's Chart, lab work & pertinent test results  Airway Mallampati: II TM Distance: >3 FB Neck ROM: Full    Dental  (+) Teeth Intact and Dental Advisory Given   Pulmonary asthma ,  breath sounds clear to auscultation  Pulmonary exam normal       Cardiovascular hypertension, + CAD Rhythm:Regular Rate:Normal  NL EF at cath 09/2009   Neuro/Psych    GI/Hepatic Neg liver ROS, GERD-  Medicated,  Endo/Other  negative endocrine ROS  Renal/GU negative Renal ROS     Musculoskeletal   Abdominal   Peds  Hematology negative hematology ROS (+)   Anesthesia Other Findings   Reproductive/Obstetrics                          Anesthesia Physical Anesthesia Plan  ASA: III  Anesthesia Plan: General   Post-op Pain Management:    Induction: Intravenous  Airway Management Planned: Oral ETT  Additional Equipment:   Intra-op Plan:   Post-operative Plan: Extubation in OR  Informed Consent: I have reviewed the patients History and Physical, chart, labs and discussed the procedure including the risks, benefits and alternatives for the proposed anesthesia with the patient or authorized representative who has indicated his/her understanding and acceptance.   Dental advisory given  Plan Discussed with: CRNA, Anesthesiologist and Surgeon  Anesthesia Plan Comments:         Anesthesia Quick Evaluation

## 2012-04-07 NOTE — Transfer of Care (Signed)
Immediate Anesthesia Transfer of Care Note  Patient: Jack Farmer  Procedure(s) Performed: Procedure(s) (LRB): ANTERIOR LATERAL LUMBAR FUSION 2 LEVELS (Right) LUMBAR PERCUTANEOUS PEDICLE SCREW 2 LEVEL (N/A)  Patient Location: PACU  Anesthesia Type: General  Level of Consciousness: awake  Airway & Oxygen Therapy: Patient Spontanous Breathing and Patient connected to nasal cannula oxygen  Post-op Assessment: Report given to PACU RN, Post -op Vital signs reviewed and stable and Patient moving all extremities  Post vital signs: Reviewed and stable  Complications: No apparent anesthesia complications

## 2012-04-07 NOTE — Preoperative (Signed)
Beta Blockers   Reason not to administer Beta Blockers:Not Applicable, took this AM 

## 2012-04-07 NOTE — Op Note (Signed)
04/07/2012  4:02 PM  PATIENT:  Jack Farmer  71 y.o. 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 Anterolateral retroperitoneal interbody fusion L3-4 L4-5 utilizing 10 mm x 55 mm x 22 mm peek interbody cages packed with morcellized allograft, 2. Segmental fixation utilizing percutaneous pedicle screws L3-4 L4-5  SURGEON:  Marikay Alar, MD  ASSISTANTS: Dr. Franky Macho  ANESTHESIA:   General  EBL: 200 ml  Total I/O In: 1500 [I.V.:1500] Out: 400 [Urine:200; Blood:200]  BLOOD ADMINISTERED:none  DRAINS: None   SPECIMEN:  No Specimen  INDICATION FOR PROCEDURE: This patient presented with severe long-standing mechanical back pain. Plain films and CT scan showed severe scoliosis with spondylosis with some stenosis at L3-4 L4-5. Recommended instrument effusion at L3-4 L4-5 in hopes of improving his pain syndrome. Patient understood the risks, benefits, and alternatives and potential outcomes and wished to proceed.  PROCEDURE DETAILS: The patient was brought to the operating room and after induction of adequate generalized endotracheal anesthesia, the patient was positioned in the left lateral decubitus position exposing the right side. The patient was positioned in the typical XLIF fashion and taped into position and trajectories were confirmed with AP lateral fluoroscopy. The skin was cleaned and then prepped with DuraPrep and draped in the usual sterile fashion. 5 cc of local anesthesia was injected. A lateral incision was made over the appropriate disc space and a incision was made posterior lateral to this. Blunt finger dissection was used to enter the retroperitoneal space. I could palpate the psoas musculature as well as the anterior face of the transverse process, and I could feel the fatty tissues of the retroperitoneum. I swept my finger up to the lateral  incision and passed my first dilator down to psoas musculature utilizing my index finger. We then used EMG monitoring to monitor our dilator. We checked our position over the L4-5 interspace with fluoroscopy and then placed a K wire into the disc space, and then used sequential dilators until our final retractor was in place. We then positioned it utilizing AP lateral fluoroscopy, and used our ball probe to make sure there were no neural structures within the working channel. I then placed the intradiscal shim, and opened my retractor further. The exact same procedure was performed at both L3-4 and L4-5. The annulus was then incised and the discectomy was done with pituitaries. The annulus was removed from the endplates with a Cobb and the opposite annulus was opened and released. I then used a series of scrapers and shavers to prepare the endplates, taking care not to violate the endplates. I then placed my 16 mm paddle across the disc space to further open opposite annulus and I checked the trajectory with AP and lateral fluoroscopy. I then used sequential trials to determine the correct size cage. The 10 mm lordotic by 55 mm x 22 mm trial fit the best, so a corresponding cage was selected and packed with morcellized allograft. Using sliders it was then tapped gently into the disc space utilizing AP fluoroscopy until it was appropriately positioned. Again the exact same procedure was done at both L3-4 and L4-5.  I then irrigated with saline solution containing bacitracin and dried any bleeding points. I checked my final construct with AP lateral fluoroscopy.  AP and lateral fluoroscopy was used to determine landmarks for the percutaneous pedicle screws next. Stab incisions were made over the pedicles  and a Jamshidi needle was passed down to identify the transverse process and the pedicle screw entry zone. Utilizing AP fluoroscopy, we started the needle at the lateral border of the pedicle and tapped it into the  pedicle into it reached the medial border of the pedicle on the AP image. EMG monitoring was used. We then checked our lateral fluoroscopy to assure our needle was in good position, and then passed our K wire into the vertebral body. We did this for each pedicle in the same way. We were then able to tap over each K wire, measured the correct length of our screws, and then place our pedicle screws over the K wires until they were in correct position. We then passed our rod through the towers and locked into position with locking caps and antitorque device. We then checked our final construct with AP lateral fluoroscopy. Then irrigated with saline solution containing bacitracin and dried all bleeding points. The wounds were then closed in layers of 0 Vicryl in the fascia, 2-0 Vicryl in the subcutaneous tissues, and 3-0 Vicryl in the subcuticular tissues. The skin was closed with Dermabond.  The patient was awakened from general anesthesia and transferred to the recovery in stable condition. At the end of the procedure all sponge, needle and instrument counts were correct.   PLAN OF CARE: Admit to inpatient   PATIENT DISPOSITION:  PACU - hemodynamically stable.   Delay start of Pharmacological VTE agent (>24hrs) due to surgical blood loss or risk of bleeding:  yes

## 2012-04-07 NOTE — H&P (Signed)
Subjective: Patient is a 71 y.o. male admitted for XLIF L3-4 L4-5. Onset of symptoms was several years ago, gradually worsening since that time.  The pain is rated severe, and is located at the across the lower back without radiation to the legs. The pain is described as aching, sharp and soreness and occurs intermittently. The symptoms have been progressive. Symptoms are exacerbated by exercise. 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   Past Medical History  Diagnosis Date  . Hypertension   . Hyperlipidemia   . Chest pain, unspecified   . Low HDL (under 40)   . CAD (coronary artery disease)   . Hypertrophy of prostate with urinary obstruction and other lower urinary tract symptoms (LUTS)   . Special screening for malignant neoplasm of prostate   . Osteoarthrosis, unspecified whether generalized or localized, unspecified site   . Low back pain   . GERD (gastroesophageal reflux disease)     Past Surgical History  Procedure Date  . Eye surgery     tightened muscles in right eye    Prior to Admission medications   Medication Sig Start Date End Date Taking? Authorizing Provider  Ascorbic Acid (VITAMIN C) 1000 MG tablet Take 1,000 mg by mouth daily.   Yes Historical Provider, MD  aspirin EC 81 MG tablet Take 81 mg by mouth daily.   Yes Historical Provider, MD  calcium carbonate (OS-CAL) 600 MG TABS Take 600 mg by mouth daily.   Yes Historical Provider, MD  Coenzyme Q10 (CO Q-10) 200 MG CAPS Take 1 tablet by mouth daily.    Yes Historical Provider, MD  HYDROcodone-acetaminophen (NORCO) 10-325 MG per tablet Take 1 tablet by mouth 4 (four) times daily as needed. For pain   Yes Historical Provider, MD  losartan-hydrochlorothiazide (HYZAAR) 100-12.5 MG per tablet Take 1 tablet by mouth daily. 08/26/11 08/25/12 Yes Etta Grandchild, MD  methocarbamol (ROBAXIN) 500 MG tablet Take 500 mg by mouth 3 (three) times daily.   Yes Historical Provider, MD  metoprolol  tartrate (LOPRESSOR) 25 MG tablet Take 25 mg by mouth 2 (two) times daily.   Yes Historical Provider, MD  Omega-3 Fatty Acids (FISH OIL) 1200 MG CAPS Take 1,200 mg by mouth daily.   Yes Historical Provider, MD  simvastatin (ZOCOR) 40 MG tablet Take 40 mg by mouth every evening.   Yes Historical Provider, MD  vitamin D, CHOLECALCIFEROL, 400 UNITS tablet Take 400 Units by mouth daily.   Yes Historical Provider, MD  vitamin E 400 UNIT capsule Take 400 Units by mouth daily.   Yes Historical Provider, MD   Allergies  Allergen Reactions  . Cephalexin Anaphylaxis  . Indomethacin Other (See Comments)     Gi bleed    History  Substance Use Topics  . Smoking status: Current Everyday Smoker  . Smokeless tobacco: Not on file  . Alcohol Use: No    Family History  Problem Relation Age of Onset  . Alcohol abuse Other   . Drug abuse Other   . Arthritis Other   . Hypertension Other      Review of Systems  Positive ROS: neg  All other systems have been reviewed and were otherwise negative with the exception of those mentioned in the HPI and as above.  Objective: Vital signs in last 24 hours: Temp:  [98.1 F (36.7 C)] 98.1 F (36.7 C) (08/23 0924) Pulse Rate:  [82] 82  (08/23 0924) Resp:  [20] 20  (08/23 0924)  BP: (144)/(85) 144/85 mmHg (08/23 0924) SpO2:  [96 %] 96 % (08/23 0924)  General Appearance: Alert, cooperative, no distress, appears stated age Head: Normocephalic, without obvious abnormality, atraumatic Eyes: PERRL, conjunctiva/corneas clear, EOM's intact, fundi benign, both eyes      Ears: Normal TM's and external ear canals, both ears Throat: Lips, mucosa, and tongue normal; teeth and gums normal Neck: Supple, symmetrical, trachea midline, no adenopathy; thyroid: No enlargement/tenderness/nodules; no carotid bruit or JVD Back: Symmetric, no curvature, ROM normal, no CVA tenderness Lungs: Clear to auscultation bilaterally, respirations unlabored Heart: Regular rate and  rhythm, S1 and S2 normal, no murmur, rub or gallop Abdomen: Soft, non-tender, bowel sounds active all four quadrants, no masses, no organomegaly Extremities: Extremities normal, atraumatic, no cyanosis or edema Pulses: 2+ and symmetric all extremities Skin: Skin color, texture, turgor normal, no rashes or lesions  NEUROLOGIC:   Mental status: Alert and oriented x4,  no aphasia, good attention span, fund of knowledge, and memory Motor Exam - grossly normal Sensory Exam - grossly normal Reflexes: trace Coordination - grossly normal Gait - grossly normal Balance - grossly normal Cranial Nerves: I: smell Not tested  II: visual acuity  OS: nl    OD: nl  II: visual fields Full to confrontation  II: pupils Equal, round, reactive to light  III,VII: ptosis None  III,IV,VI: extraocular muscles  Full ROM  V: mastication Normal  V: facial light touch sensation  Normal  V,VII: corneal reflex  Present  VII: facial muscle function - upper  Normal  VII: facial muscle function - lower Normal  VIII: hearing Not tested  IX: soft palate elevation  Normal  IX,X: gag reflex Present  XI: trapezius strength  5/5  XI: sternocleidomastoid strength 5/5  XI: neck flexion strength  5/5  XII: tongue strength  Normal    Data Review Lab Results  Component Value Date   WBC 5.9 03/31/2012   HGB 16.5 03/31/2012   HCT 45.4 03/31/2012   MCV 89.5 03/31/2012   PLT 168 03/31/2012   Lab Results  Component Value Date   NA 139 03/31/2012   K 4.1 03/31/2012   CL 100 03/31/2012   CO2 30 03/31/2012   BUN 12 03/31/2012   CREATININE 1.10 03/31/2012   GLUCOSE 96 03/31/2012   Lab Results  Component Value Date   INR 0.90 03/31/2012    Assessment/Plan: Patient admitted for XLIF L3-4 L4-5 for back pain secondary to spondylosis and stenosis. Patient has failed conservative therapy.  I explained the condition and procedure to the patient and answered any questions.  Patient wishes to proceed with procedure as planned.  Understands risks/ benefits and typical outcomes of procedure.   Jamonte Curfman S 04/07/2012 11:38 AM

## 2012-04-08 MED ORDER — SENNOSIDES-DOCUSATE SODIUM 8.6-50 MG PO TABS
1.0000 | ORAL_TABLET | Freq: Two times a day (BID) | ORAL | Status: DC
  Administered 2012-04-08 – 2012-04-10 (×5): 1 via ORAL
  Filled 2012-04-08 (×5): qty 1

## 2012-04-08 NOTE — Progress Notes (Signed)
Patient ID: Jack Farmer, male   DOB: June 28, 1941, 71 y.o.   MRN: [id number] BP 155/70  Pulse 88  Temp 98.4 F (36.9 C) (Oral)  Resp 18  SpO2 95% Alert and oriented x 4 Moving lower extremities well Decreased light touch on right thigh Sanguineous drainage from lumbar incision, no signs of infection Looks quite good overall

## 2012-04-08 NOTE — Progress Notes (Signed)
Physical Therapy Treatment Patient Details Name: Jack Farmer MRN: [id number] DOB: Sep 25, 1940 Today's Date: 04/08/2012 Time: 4098-1191 PT Time Calculation (min): 13 min  PT Assessment / Plan / Recommendation Comments on Treatment Session  Increased gait distance this PM.    Follow Up Recommendations  Home health PT    Barriers to Discharge None      Equipment Recommendations  Rolling walker with 5" wheels;3 in 1 bedside comode    Recommendations for Other Services    Frequency Min 5X/week   Plan Discharge plan remains appropriate;Frequency remains appropriate    Precautions / Restrictions Precautions Precautions: Back Precaution Booklet Issued: Yes (comment) Required Braces or Orthoses: Spinal Brace Spinal Brace: Lumbar corset;Applied in sitting position Restrictions Weight Bearing Restrictions: No   Pertinent Vitals/Pain 6/10    Mobility  Bed Mobility Bed Mobility: Supine to Sit Supine to Sit: 4: Min assist Details for Bed Mobility Assistance: verbal cues for log roll, sequencing Transfers Transfers: Sit to Stand;Stand to Sit Sit to Stand: 4: Min assist;From bed;With upper extremity assist Stand to Sit: 4: Min guard;With armrests;To chair/3-in-1 Details for Transfer Assistance: verbal cues for hand placement, sequencing Ambulation/Gait Ambulation/Gait Assistance: 4: Min guard Ambulation Distance (Feet): 280 Feet Assistive device: Rolling walker Ambulation/Gait Assistance Details: verbal cues for posture, decrease speed Gait Pattern: Trunk flexed    Exercises     PT Diagnosis: Difficulty walking;Acute pain  PT Problem List: Decreased mobility;Decreased knowledge of use of DME;Pain PT Treatment Interventions: DME instruction;Gait training;Stair training;Functional mobility training;Therapeutic activities;Patient/family education   PT Goals Acute Rehab PT Goals PT Goal Formulation: With patient Time For Goal Achievement: 04/15/12 Potential to Achieve  Goals: Good Pt will go Supine/Side to Sit: with modified independence PT Goal: Supine/Side to Sit - Progress: Goal set today Pt will go Sit to Supine/Side: with modified independence PT Goal: Sit to Supine/Side - Progress: Goal set today Pt will go Sit to Stand: with modified independence PT Goal: Sit to Stand - Progress: Goal set today Pt will go Stand to Sit: with modified independence PT Goal: Stand to Sit - Progress: Goal set today Pt will Ambulate: >150 feet;with modified independence;with rolling walker PT Goal: Ambulate - Progress: Goal set today Pt will Go Up / Down Stairs: 1-2 stairs;with min assist;with least restrictive assistive device PT Goal: Up/Down Stairs - Progress: Goal set today  Visit Information  Last PT Received On: 04/08/12 Assistance Needed: +1    Subjective Data  Subjective: "I want to walk again." Patient Stated Goal: home tomorrow   Cognition  Overall Cognitive Status: Appears within functional limits for tasks assessed/performed Arousal/Alertness: Awake/alert Orientation Level: Appears intact for tasks assessed Behavior During Session: [facility] for tasks performed    Balance     End of Session PT - End of Session Equipment Utilized During Treatment: Gait belt;Back brace Activity Tolerance: Patient tolerated treatment well Patient left: in chair;with family/visitor present;with call bell/phone within reach Nurse Communication: Mobility status   GP     Ilda Foil 04/08/2012, 4:42 PM  Aida Raider, PT  Office # [phone] Pager [phone]

## 2012-04-08 NOTE — Progress Notes (Signed)
Occupational Therapy Treatment Patient Details Name: Jack Farmer MRN: [id number] DOB: [birth year] Today's Date: 04/08/2012 Time: 1040-1103 OT Time Calculation (min): 23 min  OT Assessment / Plan / Recommendation Comments on Treatment Session Session focused on AE instruction and practice. Will need shower transfer and DME needs finalized next session    Follow Up Recommendations  No OT follow up;Supervision/Assistance - 24 hour    Barriers to Discharge       Equipment Recommendations  Rolling walker with 5" wheels;3 in 1 bedside comode    Recommendations for Other Services    Frequency Min 2X/week   Plan Discharge plan remains appropriate    Precautions / Restrictions Precautions Precautions: Back Precaution Booklet Issued: Yes (comment) Required Braces or Orthoses: Spinal Brace Spinal Brace: Lumbar corset;Applied in sitting position Restrictions Weight Bearing Restrictions: No   Pertinent Vitals/Pain Pt reports incr pain with activity- did not rate. RN aware    ADL  Lower Body Bathing: Simulated;Moderate assistance Where Assessed - Lower Body Bathing: Supported sit to stand Lower Body Dressing: Performed;Minimal assistance Where Assessed - Lower Body Dressing: Unsupported sitting (donned/doffed socks with AE) Equipment Used: Back brace;Gait belt;Rolling walker Transfers/Ambulation Related to ADLs: Min A with RW ambulation ADL Comments: Pt and wife with multiple questions regarding AE. Pt able to don/doff socks and simulate donning of pants/underwear. Demonstrated shower transfer, but pt declined performance at this time. Wife repeatedly questioning if pt needs to go home with RW- reiterated importance of RW for stability at this point. Will need to perform shower transfer during stay and ensure pt has all DME needed. Also, re-educated on toileting aid and pt returned simulated demonstration    OT Diagnosis: Generalized weakness;Acute pain  OT Problem List: Decreased  activity tolerance;Decreased knowledge of use of DME or AE;Pain;Impaired balance (sitting and/or standing);Decreased knowledge of precautions OT Treatment Interventions: Self-care/ADL training;DME and/or AE instruction;Therapeutic activities;Balance training;Patient/family education   OT Goals Acute Rehab OT Goals OT Goal Formulation: With patient/family Time For Goal Achievement: 04/15/12 Potential to Achieve Goals: Good ADL Goals Pt Will Perform Grooming: with modified independence;Standing at sink ADL Goal: Grooming - Progress: Goal set today Pt Will Perform Upper Body Bathing: with supervision;with set-up;Sit to stand in shower ADL Goal: Upper Body Bathing - Progress: Progressing toward goals Pt Will Perform Lower Body Bathing: with supervision;with set-up;with adaptive equipment;Sit to stand in shower ADL Goal: Lower Body Bathing - Progress: Progressing toward goals Pt Will Perform Lower Body Dressing: with set-up;with supervision;Sit to stand from bed;Sit to stand from chair;with adaptive equipment ADL Goal: Lower Body Dressing - Progress: Progressing toward goals Pt Will Transfer to Toilet: with modified independence;Ambulation;with DME ADL Goal: Toilet Transfer - Progress: Goal set today Pt Will Perform Toileting - Hygiene: with modified independence;with adaptive equipment ADL Goal: Toileting - Hygiene - Progress: Goal set today Pt Will Perform Tub/Shower Transfer: Shower transfer;with supervision;Ambulation;with DME;Shower seat with back ADL Goal: Web designer - Progress: Goal set today Additional ADL Goal #1: Pt will verbalize/generalize 3/3 back precautions for use with functional activities. ADL Goal: Additional Goal #1 - Progress: Progressing toward goals  Visit Information  Last OT Received On: 04/08/12 Assistance Needed: +1    Subjective Data  Subjective: I'm really hoping to get out of here tomorrow.  Patient Stated Goal: Return home with wife   Prior  Functioning  Home Living Lives With: Spouse Available Help at Discharge: Family Type of Home: House Home Access: Stairs to enter [employer] of Steps: 2 Entrance Stairs-Rails: None Home Layout:  Multi-level Alternate Level Stairs-Number of Steps: 2 Alternate Level Stairs-Rails: None Bathroom Shower/Tub: Walk-in Contractor: Standard Bathroom Accessibility: Yes How Accessible: Accessible via walker (but wife says it is a tight fit in "her" bathroom) Home Adaptive Equipment: Shower chair with back Prior Function Level of Independence: Independent Able to Take Stairs?: Yes Driving: Yes Communication Communication: No difficulties Dominant Hand: Right    Cognition  Overall Cognitive Status: Appears within functional limits for tasks assessed/performed Arousal/Alertness: Awake/alert Orientation Level: Appears intact for tasks assessed Behavior During Session: [facility] for tasks performed    Mobility   Exercises    Balance    End of Session OT - End of Session Equipment Utilized During Treatment: Back brace Activity Tolerance: Patient limited by fatigue Patient left: in chair;with call bell/phone within reach;with family/visitor present Nurse Communication: Mobility status  GO     Jack Farmer 04/08/2012, 1:31 PM

## 2012-04-08 NOTE — Evaluation (Signed)
Physical Therapy Evaluation Patient Details Name: Jack Farmer MRN: [id number] DOB: 08-09-1941 Today's Date: 04/08/2012 Time: 1020-1050 PT Time Calculation (min): 30 min  PT Assessment / Plan / Recommendation Clinical Impression  Pt underwent ALIF 04-07-12.  He is very motivated to participate with therapy.  Acute PT indicated to attain max functional level and provide education for safe d/c home with wife.    PT Assessment  Patient needs continued PT services    Follow Up Recommendations  Home health PT    Barriers to Discharge None      Equipment Recommendations  Rolling walker with 5" wheels    Recommendations for Other Services     Frequency Min 5X/week    Precautions / Restrictions Precautions Precautions: Back Precaution Booklet Issued: Yes (comment) Required Braces or Orthoses: Spinal Brace Spinal Brace: Lumbar corset;Applied in sitting position   Pertinent Vitals/Pain 7/10      Mobility  Bed Mobility Bed Mobility: Supine to Sit Supine to Sit: 4: Min assist Details for Bed Mobility Assistance: verbal cues for log roll, sequencing Transfers Transfers: Sit to Stand;Stand to Sit Sit to Stand: 4: Min assist;From bed;With upper extremity assist Stand to Sit: 4: Min assist;To chair/3-in-1;With armrests Details for Transfer Assistance: verbal cues for sequencing Ambulation/Gait Ambulation/Gait Assistance: 4: Min guard Ambulation Distance (Feet): 200 Feet Assistive device: Rolling walker Ambulation/Gait Assistance Details: verbal cues for posture Gait Pattern: Trunk flexed    Exercises     PT Diagnosis: Difficulty walking;Acute pain  PT Problem List: Decreased mobility;Decreased knowledge of use of DME;Pain PT Treatment Interventions: DME instruction;Gait training;Stair training;Functional mobility training;Therapeutic activities;Patient/family education   PT Goals Acute Rehab PT Goals PT Goal Formulation: With patient Time For Goal Achievement:  04/15/12 Potential to Achieve Goals: Good Pt will go Supine/Side to Sit: with modified independence PT Goal: Supine/Side to Sit - Progress: Goal set today Pt will go Sit to Supine/Side: with modified independence PT Goal: Sit to Supine/Side - Progress: Goal set today Pt will go Sit to Stand: with modified independence PT Goal: Sit to Stand - Progress: Goal set today Pt will go Stand to Sit: with modified independence PT Goal: Stand to Sit - Progress: Goal set today Pt will Ambulate: >150 feet;with modified independence;with rolling walker PT Goal: Ambulate - Progress: Goal set today Pt will Go Up / Down Stairs: 1-2 stairs;with min assist;with least restrictive assistive device PT Goal: Up/Down Stairs - Progress: Goal set today  Visit Information  Last PT Received On: 04/08/12 Assistance Needed: +1    Subjective Data  Subjective: "I hope to go home tomorrow." Patient Stated Goal: home   Prior Functioning  Home Living Lives With: Spouse Available Help at Discharge: Family Type of Home: House Home Access: Stairs to enter Secretary/administrator of Steps: 2 Entrance Stairs-Rails: None Home Layout: Multi-level Alternate Level Stairs-Number of Steps: 2 Alternate Level Stairs-Rails: None Home Adaptive Equipment: None Prior Function Level of Independence: Independent Able to Take Stairs?: Yes Driving: Yes Communication Communication: No difficulties    Cognition  Overall Cognitive Status: Appears within functional limits for tasks assessed/performed Arousal/Alertness: Awake/alert Orientation Level: Appears intact for tasks assessed Behavior During Session: [facility] for tasks performed    Extremity/Trunk Assessment     Balance    End of Session PT - End of Session Equipment Utilized During Treatment: Gait belt;Back brace Activity Tolerance: Patient tolerated treatment well Patient left: in chair;with family/visitor present Nurse Communication: Mobility status  GP      Ilda Foil 04/08/2012, 1:09  PM  Aida Raider, PT  Office # [phone] Pager [phone]

## 2012-04-08 NOTE — Evaluation (Signed)
Occupational Therapy Evaluation Patient Details Name: Jack Farmer MRN: [id number] DOB: 09/21/1940 Today's Date: 04/08/2012 Time: 4098-1191 OT Time Calculation (min): 17 min  OT Assessment / Plan / Recommendation Clinical Impression  Pt s/p lumbar fusion and presents with pain, decreased knowledge of precautions and brace management limiting pt's I with ADLs. Pt will benefit from skilled OT in the acute setting to maximize I with ADL and ADL mobility.     OT Assessment  Patient needs continued OT Services    Follow Up Recommendations  No OT follow up;Supervision/Assistance - 24 hour    Barriers to Discharge      Equipment Recommendations  Rolling walker with 5" wheels    Recommendations for Other Services    Frequency  Min 2X/week    Precautions / Restrictions Precautions Precautions: Back Precaution Booklet Issued: Yes (comment) Required Braces or Orthoses: Spinal Brace Spinal Brace: Lumbar corset;Applied in sitting position Restrictions Weight Bearing Restrictions: No   Pertinent Vitals/Pain Pt denies any pain at this time    ADL  Eating/Feeding: Performed;Independent Where Assessed - Eating/Feeding: Chair Lower Body Bathing: Simulated;Moderate assistance Where Assessed - Lower Body Bathing: Supported sit to stand Upper Body Dressing: Simulated;Moderate assistance Where Assessed - Upper Body Dressing: Unsupported sitting Lower Body Dressing: Simulated;+1 Total assistance Where Assessed - Lower Body Dressing: Supported sit to stand Toilet Transfer: Mining engineer Method: Sit to Barista:  (from bed) Toileting - Clothing Manipulation and Hygiene: Simulated;Moderate assistance Where Assessed - Toileting Clothing Manipulation and Hygiene: Standing Equipment Used: Back brace;Gait belt;Rolling walker Transfers/Ambulation Related to ADLs: Min A with RW ambulation ADL Comments: Pt would like to return home tomorrow  therefore will need walk-in shower transfer practice.     OT Diagnosis: Generalized weakness;Acute pain  OT Problem List: Decreased activity tolerance;Decreased knowledge of use of DME or AE;Pain;Impaired balance (sitting and/or standing);Decreased knowledge of precautions OT Treatment Interventions: Self-care/ADL training;DME and/or AE instruction;Therapeutic activities;Balance training;Patient/family education   OT Goals Acute Rehab OT Goals OT Goal Formulation: With patient/family Time For Goal Achievement: 04/15/12 Potential to Achieve Goals: Good ADL Goals Pt Will Perform Grooming: with modified independence;Standing at sink ADL Goal: Grooming - Progress: Goal set today Pt Will Perform Upper Body Bathing: with supervision;with set-up;Sit to stand in shower ADL Goal: Upper Body Bathing - Progress: Goal set today Pt Will Perform Lower Body Bathing: with supervision;with set-up;with adaptive equipment;Sit to stand in shower ADL Goal: Lower Body Bathing - Progress: Goal set today Pt Will Perform Lower Body Dressing: with set-up;with supervision;Sit to stand from bed;Sit to stand from chair;with adaptive equipment ADL Goal: Lower Body Dressing - Progress: Goal set today Pt Will Transfer to Toilet: with modified independence;Ambulation;with DME ADL Goal: Toilet Transfer - Progress: Goal set today Pt Will Perform Toileting - Hygiene: with modified independence;with adaptive equipment ADL Goal: Toileting - Hygiene - Progress: Goal set today Pt Will Perform Tub/Shower Transfer: Shower transfer;with supervision;Ambulation;with DME;Shower seat with back ADL Goal: Web designer - Progress: Goal set today Additional ADL Goal #1: Pt will verbalize/generalize 3/3 back precautions for use with functional activities. ADL Goal: Additional Goal #1 - Progress: Goal set today  Visit Information  Last OT Received On: 04/08/12 Assistance Needed: +1    Subjective Data  Subjective: I'm really  hoping to get out of here tomorrow.  Patient Stated Goal: Return home with wife   Prior Functioning  Vision/Perception  Home Living Lives With: Spouse Available Help at Discharge: Family Type of Home: House Home Access:  Stairs to enter [employer] of Steps: 2 Entrance Stairs-Rails: None Home Layout: Multi-level Alternate Level Stairs-Number of Steps: 2 Alternate Level Stairs-Rails: None Bathroom Shower/Tub: Walk-in Contractor: Standard Bathroom Accessibility: Yes How Accessible: Accessible via walker (but wife says it is a tight fit in "her" bathroom) Home Adaptive Equipment: Shower chair with back Prior Function Level of Independence: Independent Able to Take Stairs?: Yes Driving: Yes Communication Communication: No difficulties Dominant Hand: Right      Cognition  Overall Cognitive Status: Appears within functional limits for tasks assessed/performed Arousal/Alertness: Awake/alert Orientation Level: Appears intact for tasks assessed Behavior During Session: [facility] for tasks performed    Extremity/Trunk Assessment Right Upper Extremity Assessment RUE ROM/Strength/Tone: Within functional levels Left Upper Extremity Assessment LUE ROM/Strength/Tone: Within functional levels   Mobility Bed Mobility Bed Mobility: Supine to Sit Supine to Sit: 4: Min assist Details for Bed Mobility Assistance: verbal cues for log roll, sequencing Transfers Sit to Stand: 4: Min assist;From bed;With upper extremity assist Stand to Sit: 4: Min assist;To chair/3-in-1;With armrests Details for Transfer Assistance: verbal cues for sequencing   Exercise    Balance    End of Session OT - End of Session Equipment Utilized During Treatment: Gait belt;Back brace Activity Tolerance: Patient tolerated treatment well Patient left: in chair;with call bell/phone within reach;with family/visitor present Nurse Communication: Mobility status  GO      Jack Farmer 04/08/2012, 1:25 PM

## 2012-04-09 NOTE — Progress Notes (Signed)
Physical Therapy Treatment Patient Details Name: Jack Farmer MRN: [id number] DOB: 05-01-1941 Today's Date: 04/09/2012 Time: 6213-0865 PT Time Calculation (min): 29 min  PT Assessment / Plan / Recommendation Comments on Treatment Session  Pt progressing well. Increased gait distance and completed stair training today.  Wife present during session.    Follow Up Recommendations  Home health PT    Barriers to Discharge        Equipment Recommendations  Rolling walker with 5" wheels;3 in 1 bedside comode    Recommendations for Other Services    Frequency Min 5X/week   Plan Discharge plan remains appropriate;Frequency remains appropriate    Precautions / Restrictions Precautions Precautions: Back Precaution Comments: Reviewed 3/3 back precautions Required Braces or Orthoses: Spinal Brace Spinal Brace: Lumbar corset   Pertinent Vitals/Pain 6/10    Mobility  Bed Mobility Supine to Sit: 4: Min assist;HOB elevated;With rails Details for Bed Mobility Assistance: verbal cues for precautions/log roll Transfers Sit to Stand: 4: Min guard;From bed;With upper extremity assist Stand to Sit: 4: Min guard;To chair/3-in-1;With armrests Details for Transfer Assistance: verbal cues for hand placement Ambulation/Gait Ambulation/Gait Assistance: 5: Supervision Ambulation Distance (Feet): 200 Feet (x 2) Assistive device: Rolling walker Ambulation/Gait Assistance Details: verbal cues for posture, Ambulation x 50 ft with SPC Min guard assist.  Pt prefers RW for increased stability/support. Gait Pattern: Trunk flexed Stairs: Yes Stairs Assistance: 4: Min assist Stairs Assistance Details (indicate cue type and reason): verbal cues for sequencing.  Knee buckling noted during descent with min assist to recover/maintain stance. Stair Management Technique: With cane Number of Stairs: 2  (x 2)    Exercises     PT Diagnosis:    PT Problem List:   PT Treatment Interventions:     PT  Goals Acute Rehab PT Goals PT Goal: Supine/Side to Sit - Progress: Progressing toward goal PT Goal: Sit to Supine/Side - Progress: Progressing toward goal PT Goal: Sit to Stand - Progress: Progressing toward goal PT Goal: Stand to Sit - Progress: Progressing toward goal PT Goal: Ambulate - Progress: Progressing toward goal PT Goal: Up/Down Stairs - Progress: Met  Visit Information  Last PT Received On: 04/09/12 Assistance Needed: +1    Subjective Data  Subjective: "I am ready to go home." Patient Stated Goal: home   Cognition  Overall Cognitive Status: Appears within functional limits for tasks assessed/performed Arousal/Alertness: Awake/alert Orientation Level: Appears intact for tasks assessed Behavior During Session: [facility] for tasks performed    Balance     End of Session PT - End of Session Equipment Utilized During Treatment: Gait belt;Back brace Activity Tolerance: Patient tolerated treatment well Patient left: in chair;with call bell/phone within reach;with family/visitor present Nurse Communication: Mobility status   GP     Ilda Foil 04/09/2012, 11:46 AM  Aida Raider, PT  Office # [phone] Pager [phone]

## 2012-04-09 NOTE — Progress Notes (Signed)
Patient ID: Jack Farmer, male   DOB: [birth year], 71 y.o.   MRN: [id number] BP 153/76  Pulse 62  Temp 97.7 F (36.5 C) (Oral)  Resp 18  Ht 6\' 2"  (1.88 m)  Wt 114.845 kg (253 lb 3 oz)  BMI 32.51 kg/m2  SpO2 96% Alert and oriented x 4 5/5 strength lower extremities Wound is clean, ecchymotic, tender Feels more swollen today Continue pt, not quite as good today as yesterday.

## 2012-04-10 MED ORDER — POLYETHYLENE GLYCOL 3350 17 G PO PACK
17.0000 g | PACK | Freq: Every day | ORAL | Status: DC
  Administered 2012-04-10: 17 g via ORAL
  Filled 2012-04-10: qty 1

## 2012-04-10 MED ORDER — METHOCARBAMOL 500 MG PO TABS: 500.0000 mg | ORAL_TABLET | Freq: Three times a day (TID) | ORAL | Status: DC

## 2012-04-10 MED ORDER — OXYCODONE-ACETAMINOPHEN 5-325 MG PO TABS: 1.0000 | ORAL_TABLET | ORAL | Status: AC | PRN

## 2012-04-10 NOTE — Progress Notes (Signed)
Pt. Discharged to home accompanied by wife. Rx for norco and discharge instructions explained. Patient stated understanding. Patient left unit in stable condition via wheelchair.

## 2012-04-10 NOTE — Progress Notes (Signed)
Physical Therapy Treatment Patient Details Name: Jack Farmer MRN: [id number] DOB: [birth year] Today's Date: 04/10/2012 Time: 1345-1410 PT Time Calculation (min): 25 min  PT Assessment / Plan / Recommendation Comments on Treatment Session  Pt was able to perform car transfer and ambulation with supervision.  All concerns addressed and resolved with family. Patient safe for d/c at this time. All goals met with acute PT.    Follow Up Recommendations  Home health PT    Barriers to Discharge        Equipment Recommendations  Rolling walker with 5" wheels;3 in 1 bedside comode    Recommendations for Other Services    Frequency Min 5X/week   Plan Discharge plan remains appropriate;Frequency remains appropriate    Precautions / Restrictions Precautions Precautions: Back Precaution Comments: Pt able to state 3/3 back precautions. Required Braces or Orthoses: Spinal Brace Spinal Brace: Lumbar corset Restrictions Weight Bearing Restrictions: No   Pertinent Vitals/Pain 2/10    Mobility  Bed Mobility Bed Mobility: Not assessed Transfers Sit to Stand: 6: Modified independent (Device/Increase time) Stand to Sit: 6: Modified independent (Device/Increase time) Ambulation/Gait Ambulation/Gait Assistance: 5: Supervision Ambulation Distance (Feet): 350 Feet Assistive device: Rolling walker Ambulation/Gait Assistance Details: VCs for upright posture Gait Pattern: Trunk flexed;Step-through pattern (right foot drag ) Gait velocity: decreased      PT Goals Acute Rehab PT Goals PT Goal: Supine/Side to Sit - Progress: Met PT Goal: Sit to Supine/Side - Progress: Met PT Goal: Sit to Stand - Progress: Met PT Goal: Stand to Sit - Progress: Met PT Goal: Ambulate - Progress: Met  Visit Information  Last PT Received On: 04/10/12 Assistance Needed: +1    Subjective Data  Subjective: Concerned about getting in/out of a car Patient Stated Goal: to go home   Cognition  Overall  Cognitive Status: Appears within functional limits for tasks assessed/performed Arousal/Alertness: Awake/alert Orientation Level: Appears intact for tasks assessed Behavior During Session: [facility] for tasks performed    Balance     End of Session PT - End of Session Equipment Utilized During Treatment: Gait belt;Back brace Activity Tolerance: Patient tolerated treatment well Patient left: in chair;with family/visitor present Nurse Communication: Mobility status   GP     Fabio Asa 04/10/2012, 2:40 PM Charlotte Crumb, PT DPT  [phone]

## 2012-04-10 NOTE — Discharge Summary (Signed)
Physician Discharge Summary  Patient ID: Jack Farmer MRN: [id number] DOB/AGE: March 07, 1941 71 y.o.  Admit date: 04/07/2012 Discharge date: 04/10/2012  Admission Diagnoses: lumbar stenosis   Discharge Diagnoses: same   Discharged Condition: good  Hospital Course: The patient was admitted on 04/07/2012 and taken to the operating room where the patient underwent XLIF L3-4, L4-5. The patient tolerated the procedure well and was taken to the recovery room and then to the floor in stable condition. The hospital course was routine. There were no complications. The wound remained clean dry and intact. Pt had appropriate back soreness. No complaints of leg pain or new N/T/W. The patient remained afebrile with stable vital signs, and tolerated a regular diet. The patient continued to increase activities, and pain was well controlled with oral pain medications.   Consults: None  Significant Diagnostic Studies:  Results for orders placed during the hospital encounter of 03/31/12  SURGICAL PCR SCREEN      Component Value Range   MRSA, PCR NEGATIVE  NEGATIVE   Staphylococcus aureus POSITIVE (*) NEGATIVE  TYPE AND SCREEN      Component Value Range   ABO/RH(D) B POS     Antibody Screen NEG     Sample Expiration 04/14/2012    BASIC METABOLIC PANEL      Component Value Range   Sodium 139  135 - 145 mEq/L   Potassium 4.1  3.5 - 5.1 mEq/L   Chloride 100  96 - 112 mEq/L   CO2 30  19 - 32 mEq/L   Glucose, Bld 96  70 - 99 mg/dL   BUN 12  6 - 23 mg/dL   Creatinine, Ser 4.09  0.50 - 1.35 mg/dL   Calcium 81.1  8.4 - 91.4 mg/dL   GFR calc non Af Amer 66 (*) >90 mL/min   GFR calc Af Amer 77 (*) >90 mL/min  CBC WITH DIFFERENTIAL      Component Value Range   WBC 5.9  4.0 - 10.5 K/uL   RBC 5.07  4.22 - 5.81 MIL/uL   Hemoglobin 16.5  13.0 - 17.0 g/dL   HCT 78.2  95.6 - 21.3 %   MCV 89.5  78.0 - 100.0 fL   MCH 32.5  26.0 - 34.0 pg   MCHC 36.3 (*) 30.0 - 36.0 g/dL   RDW 08.6  57.8 - 46.9 %   Platelets 168  150 - 400 K/uL   Neutrophils Relative 55  43 - 77 %   Neutro Abs 3.2  1.7 - 7.7 K/uL   Lymphocytes Relative 34  12 - 46 %   Lymphs Abs 2.0  0.7 - 4.0 K/uL   Monocytes Relative 9  3 - 12 %   Monocytes Absolute 0.5  0.1 - 1.0 K/uL   Eosinophils Relative 1  0 - 5 %   Eosinophils Absolute 0.1  0.0 - 0.7 K/uL   Basophils Relative 1  0 - 1 %   Basophils Absolute 0.0  0.0 - 0.1 K/uL  PROTIME-INR      Component Value Range   Prothrombin Time 12.3  11.6 - 15.2 seconds   INR 0.90  0.00 - 1.49  ABO/RH      Component Value Range   ABO/RH(D) B POS      Chest 2 View  03/31/2012  *RADIOLOGY REPORT*  Clinical Data: Preop for lumbar fusion  CHEST - 2 VIEW  Comparison: 04/06/2010  Findings: Cardiomediastinal silhouette is stable.  No acute infiltrate or pleural effusion.  No pulmonary edema.  Stable mild degenerative changes thoracic spine.  IMPRESSION: No active disease.  No significant change.  Original Report Authenticated By: Natasha Mead, M.D.   Dg Lumbar Spine 2-3 Views  04/07/2012  *RADIOLOGY REPORT*  Clinical Data: LUMBAR FUSION.  DG C-ARM GT 120 MIN,LUMBAR SPINE - 2-3 VIEW  Comparison: 03/13/2012  Findings: Two intraoperative spot images demonstrate to level fusion in the lumbar spine, reportedly L3-L5.  Right pedicle screws noted.  No hardware bony complicating feature.  IMPRESSION: L3-L5 unilateral right posterior fusion.   Original Report Authenticated By: Cyndie Chime, M.D.    Ct Lumbar Spine Wo Contrast  03/16/2012  *RADIOLOGY REPORT*  Clinical Data: Low back pain.  No known injury.  CT LUMBAR SPINE WITHOUT CONTRAST  Technique:  Multidetector CT imaging of the lumbar spine was performed without intravenous contrast administration. Multiplanar CT image reconstructions were also generated.  Comparison: Lumbar spine radiographs 03/13/2012.  Findings: There are five lumbar type vertebral bodies.  There is multilevel spondylosis with paraspinal osteophyte formation.  There is a mild  convex left scoliosis centered at L2-L3.  There is auto fusion across L2-L3 disc space.  There is diffuse vacuum phenomenon at the lower three disc space levels.  No suspicious paraspinal findings are demonstrated.  There is mild aorto iliac atherosclerosis.  There is a possible right adrenal nodule, only partially imaged on the first image for this examination.  This measures 6 HU and if real is likely an incidental adenoma.  L1-L2:  There is disc bulging with anterior osteophyte formation. No significant spinal stenosis or nerve root encroachment results.  L2-L3:  There is spondylosis with auto fusion across the disc space.  There are paraspinal osteophytes which are asymmetric to the right.  There is mild central stenosis.  No significant foraminal compromise is present.  L3-L4:  There is advanced degenerative disc disease with annular disc bulging, vacuum phenomenon and paraspinal osteophyte formation.  There is moderate facet and ligamentous hypertrophy. These factors contribute to moderate spinal stenosis with right greater than left lateral recess stenosis.  Both foramina are mildly stenotic.  L4-L5:  There is advanced disc degeneration with annular bulging, vacuum phenomenon and paraspinal osteophyte formation.  Moderate facet and ligamentous hypertrophy is present bilaterally.  There is resulting moderate to severe spinal stenosis.  In addition, there is moderate lateral recess and foraminal narrowing bilaterally.  L5-S1:  There is chronic disc degeneration with vacuum phenomenon and posterior osteophytes.  Facet degenerative changes are asymmetric to the left.  Mild to moderate foraminal narrowing is present bilaterally.  The central canal and lateral recesses appear adequately patent.  IMPRESSION:  1.  Multilevel spondylosis as described with advanced disc degeneration, vacuum phenomenon and osteophytes at the lower three levels. 2.  There is resulting moderate to severe multifactorial spinal stenosis at  L4-L5.  Spinal stenosis is moderate at L3-L4 and mild at L2-L3. 3.  Osteophytes and facet disease contribute to foraminal narrowing at multiple levels as detailed above. 4.  Auto fusion across the L2-L3 disc space.  Original Report Authenticated By: Gerrianne Scale, M.D.   Dg C-arm Gt 120 Min  04/07/2012  *RADIOLOGY REPORT*  Clinical Data: LUMBAR FUSION.  DG C-ARM GT 120 MIN,LUMBAR SPINE - 2-3 VIEW  Comparison: 03/13/2012  Findings: Two intraoperative spot images demonstrate to level fusion in the lumbar spine, reportedly L3-L5.  Right pedicle screws noted.  No hardware bony complicating feature.  IMPRESSION: L3-L5 unilateral right posterior fusion.   Original Report Authenticated  By: Cyndie Chime, M.D.     Antibiotics:  Anti-infectives     Start     Dose/Rate Route Frequency Ordered Stop   04/07/12 2200   vancomycin (VANCOCIN) IVPB 1000 mg/200 mL premix        1,000 mg 200 mL/hr over 60 Minutes Intravenous  Once 04/07/12 1749 04/08/12 0055   04/07/12 1745   ceFAZolin (ANCEF) IVPB 1 g/50 mL premix  Status:  Discontinued        1 g 100 mL/hr over 30 Minutes Intravenous Every 8 hours 04/07/12 1734 04/07/12 1746   04/07/12 1257   bacitracin 50,000 Units in sodium chloride irrigation 0.9 % 500 mL irrigation  Status:  Discontinued          As needed 04/07/12 1317 04/07/12 1558   04/07/12 1143   bacitracin 30865 UNITS injection     Comments: AURAND, BRANDI: cabinet override         04/07/12 1143 04/07/12 2344   04/07/12 0000   vancomycin (VANCOCIN) 1,500 mg in sodium chloride 0.9 % 500 mL IVPB        1,500 mg 250 mL/hr over 120 Minutes Intravenous 120 min pre-op 04/06/12 1510 04/07/12 1145          Discharge Exam: Blood pressure 134/76, pulse 90, temperature 97.9 F (36.6 C), temperature source Oral, resp. rate 16, height 6\' 2"  (1.88 m), weight 114.845 kg (253 lb 3 oz), SpO2 95.00%. Neurologic: Grossly normal Incisions ok  Discharge Medications:   Medication List  As of  04/10/2012  8:35 AM   STOP taking these medications         HYDROcodone-acetaminophen 10-325 MG per tablet         TAKE these medications         aspirin EC 81 MG tablet   Take 81 mg by mouth daily.      calcium carbonate 600 MG Tabs   Commonly known as: OS-CAL   Take 600 mg by mouth daily.      Co Q-10 200 MG Caps   Take 1 tablet by mouth daily.      Fish Oil 1200 MG Caps   Take 1,200 mg by mouth daily.      losartan-hydrochlorothiazide 100-12.5 MG per tablet   Commonly known as: HYZAAR   Take 1 tablet by mouth daily.      methocarbamol 500 MG tablet   Commonly known as: ROBAXIN   Take 1 tablet (500 mg total) by mouth 3 (three) times daily.      metoprolol tartrate 25 MG tablet   Commonly known as: LOPRESSOR   Take 25 mg by mouth 2 (two) times daily.      oxyCODONE-acetaminophen 5-325 MG per tablet   Commonly known as: PERCOCET/ROXICET   Take 1-2 tablets by mouth every 4 (four) hours as needed for pain.      simvastatin 40 MG tablet   Commonly known as: ZOCOR   Take 40 mg by mouth every evening.      vitamin C 1000 MG tablet   Take 1,000 mg by mouth daily.      vitamin D (CHOLECALCIFEROL) 400 UNITS tablet   Take 400 Units by mouth daily.      vitamin E 400 UNIT capsule   Take 400 Units by mouth daily.            Disposition: home   Final Dx: XLIF  Discharge Orders    Future Orders Please Complete By Expires  Diet - low sodium heart healthy      Increase activity slowly      Driving Restrictions      Comments:   2 weeks   Lifting restrictions      Comments:   Less than 8 lbs   No wound care      Call MD for:  temperature >100.4      Call MD for:  persistant nausea and vomiting      Call MD for:  severe uncontrolled pain      Call MD for:  redness, tenderness, or signs of infection (pain, swelling, redness, odor or green/yellow discharge around incision site)      Call MD for:  difficulty breathing, headache or visual disturbances          Follow-up Information    Follow up with Wiletta Bermingham S, MD. Schedule an appointment as soon as possible for a visit in 2 weeks.   Contact information:   [address] [place] [address] [phone]           Signed: Tia Alert 04/10/2012, 8:35 AM

## 2012-04-10 NOTE — Progress Notes (Signed)
CARE MANAGEMENT NOTE 04/10/2012  Patient:  AUGIE, VANE   Account Number:  [id number]  Date Initiated:  04/10/2012  Documentation initiated by:  Vance Peper  Subjective/Objective Assessment:   71 yr old male s/p XLIF L3-4, 4-5     Action/Plan:   CM spoke with patient and wife regarding home health and DME needs. Choice offered. Wife wants OT to instruct on car transfers prior to Discharge, asked nurse to contact OT therapist.   Anticipated DC Date:  04/10/2012   Anticipated DC Plan:  HOME W HOME HEALTH SERVICES      DC Planning Services  CM consult      PAC Choice  DURABLE MEDICAL EQUIPMENT  HOME HEALTH   Choice offered to / List presented to:  C-1 Patient   DME arranged  3-N-1      DME agency  [facility]     HH arranged  HH-2 PT      [facility] agency  [facility]   Status of service:  Completed, signed off Medicare Important Message given?   (If response is "NO", the following Medicare IM given date fields will be blank) Date Medicare IM given:   Date Additional Medicare IM given:    Discharge Disposition:  HOME W HOME HEALTH SERVICES  Per UR Regulation:    If discussed at Long Length of Stay Meetings, dates discussed:    Comments:

## 2012-04-10 NOTE — Anesthesia Postprocedure Evaluation (Addendum)
Anesthesia Post Note  Patient: Jack Farmer  Procedure(s) Performed: Procedure(s) (LRB): ANTERIOR LATERAL LUMBAR FUSION 2 LEVELS (Right) LUMBAR PERCUTANEOUS PEDICLE SCREW 2 LEVEL (N/A)  Anesthesia type: general  Patient location: floor  Post pain: Pain level controlled  Post assessment: Patient's Cardiovascular Status Stable  Last Vitals:  Filed Vitals:   04/10/12 0620  BP: 134/76  Pulse: 90  Temp: 36.6 C  Resp: 16    Post vital signs: Reviewed and stable  Level of consciousness: sedated  Complications: No apparent anesthesia complications

## 2012-04-10 NOTE — Progress Notes (Signed)
Occupational Therapy Treatment Patient Details Name: Jack Farmer MRN: [id number] DOB: Jul 18, 1941 Today's Date: 04/10/2012 Time: 3086-5784 OT Time Calculation (min): 21 min  OT Assessment / Plan / Recommendation Comments on Treatment Session Pt primarily concerned about not having had BM.  Pt and wife are knowledgeable in back precautions, adaptive equipment, and DME.      Follow Up Recommendations  No OT follow up;Supervision/Assistance - 24 hour    Barriers to Discharge       Equipment Recommendations  Rolling walker with 5" wheels;3 in 1 bedside comode    Recommendations for Other Services    Frequency Min 2X/week   Plan Discharge plan remains appropriate    Precautions / Restrictions Precautions Precautions: Back Precaution Comments: Pt able to state 3/3 back precautions. Required Braces or Orthoses: Spinal Brace Spinal Brace: Lumbar corset Restrictions Weight Bearing Restrictions: No   Pertinent Vitals/Pain     ADL  Grooming: Performed;Wash/dry hands;Supervision/safety Where Assessed - Grooming: Unsupported standing Toilet Transfer: Research scientist (life sciences) Method: Sit to Barista: Raised toilet seat with arms (or 3-in-1 over toilet) Toileting - Clothing Manipulation and Hygiene: Supervision/safety Where Assessed - Engineer, mining and Hygiene: Standing Tub/Shower Transfer: Landscape architect Method: Ambulating Equipment Used: Back brace;Gait belt;Rolling walker Transfers/Ambulation Related to ADLs: supervision with RW ADL Comments: Pt is knowledgable in use of AE for LB ADL.  Discussed various options for shower seat.  Determined that use of 3 in 1 is safest with wife supervising transfer.   Pt is primarily concerned with not having had a BM.  Also verbally instructed pt in car transfer technique.  Plans to have neighbor with SUV come to take pt home as wife's car is low.    OT Diagnosis:    OT Problem List:   OT Treatment Interventions:     OT Goals ADL Goals Pt Will Perform Grooming: with modified independence;Standing at sink ADL Goal: Grooming - Progress: Progressing toward goals Pt Will Transfer to Toilet: with modified independence;Ambulation;with DME ADL Goal: Toilet Transfer - Progress: Progressing toward goals Pt Will Perform Tub/Shower Transfer: Shower transfer;with supervision;Ambulation;with DME;Shower seat with back ADL Goal: Web designer - Progress: Met Additional ADL Goal #1: Pt will verbalize/generalize 3/3 back precautions for use with functional activities. ADL Goal: Additional Goal #1 - Progress: Met  Visit Information  Last OT Received On: 04/10/12 Assistance Needed: +1    Subjective Data      Prior Functioning       Cognition  Overall Cognitive Status: Appears within functional limits for tasks assessed/performed Arousal/Alertness: Awake/alert Orientation Level: Appears intact for tasks assessed Behavior During Session: [facility] for tasks performed    Mobility Bed Mobility Bed Mobility: Not assessed Transfers Sit to Stand: 5: Supervision;From chair/3-in-1;With upper extremity assist Stand to Sit: 5: Supervision;With upper extremity assist   Exercises    Balance    End of Session OT - End of Session Equipment Utilized During Treatment: Back brace Activity Tolerance: Patient tolerated treatment well Patient left: in chair;with call bell/phone within reach;with family/visitor present  GO     Evern Bio 04/10/2012, 11:44 AM [phone]

## 2012-04-11 ENCOUNTER — Encounter (HOSPITAL_COMMUNITY): Payer: Self-pay | Admitting: Neurological Surgery

## 2012-04-13 ENCOUNTER — Encounter (HOSPITAL_COMMUNITY): Payer: Self-pay

## 2012-04-24 ENCOUNTER — Other Ambulatory Visit: Payer: Self-pay | Admitting: *Deleted

## 2012-04-24 MED ORDER — SIMVASTATIN 40 MG PO TABS: 40.0000 mg | ORAL_TABLET | Freq: Every evening | ORAL | Status: DC

## 2012-08-25 ENCOUNTER — Other Ambulatory Visit: Payer: Self-pay | Admitting: Internal Medicine

## 2012-08-25 DIAGNOSIS — I10 Essential (primary) hypertension: Secondary | ICD-10-CM

## 2012-08-25 MED ORDER — LOSARTAN POTASSIUM-HCTZ 100-12.5 MG PO TABS: 1.0000 | ORAL_TABLET | Freq: Every day | ORAL | Status: DC

## 2012-09-21 ENCOUNTER — Other Ambulatory Visit: Payer: Self-pay | Admitting: Internal Medicine

## 2012-09-21 NOTE — Telephone Encounter (Signed)
Pt has CPE scheduled for 10/26/2012 - this was the next available.

## 2012-09-21 NOTE — Telephone Encounter (Signed)
He needs to be seen

## 2012-09-22 NOTE — Addendum Note (Signed)
Addended by: Rock Nephew T on: 09/22/2012 08:24 AM   Modules accepted: Orders

## 2012-09-25 ENCOUNTER — Telehealth: Payer: Self-pay | Admitting: Internal Medicine

## 2012-09-25 ENCOUNTER — Other Ambulatory Visit: Payer: Self-pay | Admitting: Internal Medicine

## 2012-09-25 DIAGNOSIS — I10 Essential (primary) hypertension: Secondary | ICD-10-CM

## 2012-09-25 DIAGNOSIS — I2510 Atherosclerotic heart disease of native coronary artery without angina pectoris: Secondary | ICD-10-CM

## 2012-09-25 DIAGNOSIS — Z136 Encounter for screening for cardiovascular disorders: Secondary | ICD-10-CM

## 2012-09-25 MED ORDER — METOPROLOL TARTRATE 25 MG PO TABS: 25.0000 mg | ORAL_TABLET | Freq: Two times a day (BID) | ORAL | Status: DC

## 2012-09-25 NOTE — Telephone Encounter (Signed)
Pt has rs'd his physical sooner to this Thurs. Feb 13.  He is requesting enough bp medicine to get to the appt.  Or samples?  Please call to let them know if this is possible.

## 2012-09-25 NOTE — Telephone Encounter (Signed)
done

## 2012-09-25 NOTE — Telephone Encounter (Signed)
Patients wife is still waiting to have patients medication called in to the pharmacy ([facility] [address]) he has an appointment for this Thursday and she was hoping for get a few pills called in since he has been out of the medication for few days. She would like to know if it is ok for him to continue not taking this until Thursday if none can be called in?

## 2012-09-26 NOTE — Telephone Encounter (Signed)
Patient's wife informed

## 2012-09-28 ENCOUNTER — Encounter: Payer: Medicare Other | Admitting: Internal Medicine

## 2012-10-10 ENCOUNTER — Encounter: Payer: Medicare Other | Admitting: Internal Medicine

## 2012-10-16 ENCOUNTER — Encounter: Payer: Self-pay | Admitting: Internal Medicine

## 2012-10-16 ENCOUNTER — Ambulatory Visit (INDEPENDENT_AMBULATORY_CARE_PROVIDER_SITE_OTHER): Payer: Medicare Other | Admitting: Internal Medicine

## 2012-10-16 ENCOUNTER — Other Ambulatory Visit (INDEPENDENT_AMBULATORY_CARE_PROVIDER_SITE_OTHER): Payer: Medicare Other

## 2012-10-16 VITALS — BP 136/72 | HR 80 | Temp 97.8°F | Resp 16 | Wt 252.0 lb

## 2012-10-16 DIAGNOSIS — N138 Other obstructive and reflux uropathy: Secondary | ICD-10-CM

## 2012-10-16 DIAGNOSIS — R739 Hyperglycemia, unspecified: Secondary | ICD-10-CM

## 2012-10-16 DIAGNOSIS — E785 Hyperlipidemia, unspecified: Secondary | ICD-10-CM

## 2012-10-16 DIAGNOSIS — R7309 Other abnormal glucose: Secondary | ICD-10-CM

## 2012-10-16 DIAGNOSIS — E786 Lipoprotein deficiency: Secondary | ICD-10-CM

## 2012-10-16 DIAGNOSIS — F528 Other sexual dysfunction not due to a substance or known physiological condition: Secondary | ICD-10-CM

## 2012-10-16 DIAGNOSIS — I2510 Atherosclerotic heart disease of native coronary artery without angina pectoris: Secondary | ICD-10-CM

## 2012-10-16 DIAGNOSIS — N401 Enlarged prostate with lower urinary tract symptoms: Secondary | ICD-10-CM

## 2012-10-16 DIAGNOSIS — Z0000 Encounter for general adult medical examination without abnormal findings: Secondary | ICD-10-CM

## 2012-10-16 DIAGNOSIS — I10 Essential (primary) hypertension: Secondary | ICD-10-CM

## 2012-10-16 LAB — URINALYSIS, ROUTINE W REFLEX MICROSCOPIC
Bilirubin Urine: NEGATIVE
Ketones, ur: NEGATIVE
Leukocytes, UA: NEGATIVE
Urine Glucose: NEGATIVE
pH: 6.5 (ref 5.0–8.0)

## 2012-10-16 LAB — CBC WITH DIFFERENTIAL/PLATELET
Basophils Relative: 0.5 % (ref 0.0–3.0)
Eosinophils Relative: 1.3 % (ref 0.0–5.0)
Lymphocytes Relative: 30.5 % (ref 12.0–46.0)
Monocytes Absolute: 0.4 10*3/uL (ref 0.1–1.0)
Neutrophils Relative %: 60.5 % (ref 43.0–77.0)
Platelets: 170 10*3/uL (ref 150.0–400.0)
RBC: 5.03 Mil/uL (ref 4.22–5.81)
WBC: 5.1 10*3/uL (ref 4.5–10.5)

## 2012-10-16 LAB — COMPREHENSIVE METABOLIC PANEL
Albumin: 3.8 g/dL (ref 3.5–5.2)
BUN: 15 mg/dL (ref 6–23)
CO2: 27 mEq/L (ref 19–32)
Calcium: 8.9 mg/dL (ref 8.4–10.5)
Chloride: 101 mEq/L (ref 96–112)
GFR: 65.95 mL/min (ref >60.00)
Glucose, Bld: 112 mg/dL — ABNORMAL HIGH (ref 70–99)
Potassium: 4.1 mEq/L (ref 3.5–5.1)
Sodium: 138 mEq/L (ref 135–145)
Total Protein: 6.4 g/dL (ref 6.0–8.3)

## 2012-10-16 LAB — LIPID PANEL
Cholesterol: 108 mg/dL (ref 0–200)
VLDL: 43 mg/dL — ABNORMAL HIGH (ref 0.0–40.0)

## 2012-10-16 LAB — PSA: PSA: 0.77 ng/mL (ref 0.10–4.00)

## 2012-10-16 LAB — TSH: TSH: 1.32 u[IU]/mL (ref 0.35–5.50)

## 2012-10-16 LAB — LDL CHOLESTEROL, DIRECT: Direct LDL: 56.3 mg/dL

## 2012-10-16 MED ORDER — LOSARTAN POTASSIUM-HCTZ 100-12.5 MG PO TABS: 1.0000 | ORAL_TABLET | Freq: Every day | ORAL | Status: DC

## 2012-10-16 MED ORDER — TADALAFIL 20 MG PO TABS: 20.0000 mg | ORAL_TABLET | Freq: Every day | ORAL | Status: DC | PRN

## 2012-10-16 MED ORDER — SIMVASTATIN 40 MG PO TABS: 40.0000 mg | ORAL_TABLET | Freq: Every evening | ORAL | Status: DC

## 2012-10-16 NOTE — Assessment & Plan Note (Signed)
I will check his PSA today 

## 2012-10-16 NOTE — Assessment & Plan Note (Signed)
He will try cialis 

## 2012-10-16 NOTE — Assessment & Plan Note (Signed)
I have asked him again to see his cardiologist

## 2012-10-16 NOTE — Assessment & Plan Note (Signed)
I will check his a1c to see if he has developed DM II 

## 2012-10-16 NOTE — Assessment & Plan Note (Signed)

## 2012-10-16 NOTE — Patient Instructions (Signed)
Health Maintenance, Males A healthy lifestyle and preventative care can promote health and wellness.  Maintain regular health, dental, and eye exams.  Eat a healthy diet. Foods like vegetables, fruits, whole grains, low-fat dairy products, and lean protein foods contain the nutrients you need without too many calories. Decrease your intake of foods high in solid fats, added sugars, and salt. Get information about a proper diet from your caregiver, if necessary.  Regular physical exercise is one of the most important things you can do for your health. Most adults should get at least 150 minutes of moderate-intensity exercise (any activity that increases your heart rate and causes you to sweat) each week. In addition, most adults need muscle-strengthening exercises on 2 or more days a week.   Maintain a healthy weight. The body mass index (BMI) is a screening tool to identify possible weight problems. It provides an estimate of body fat based on height and weight. Your caregiver can help determine your BMI, and can help you achieve or maintain a healthy weight. For adults 20 years and older:  A BMI below 18.5 is considered underweight.  A BMI of 18.5 to 24.9 is normal.  A BMI of 25 to 29.9 is considered overweight.  A BMI of 30 and above is considered obese.  Maintain normal blood lipids and cholesterol by exercising and minimizing your intake of saturated fat. Eat a balanced diet with plenty of fruits and vegetables. Blood tests for lipids and cholesterol should begin at age 20 and be repeated every 5 years. If your lipid or cholesterol levels are high, you are over 50, or you are a high risk for heart disease, you may need your cholesterol levels checked more frequently.Ongoing high lipid and cholesterol levels should be treated with medicines, if diet and exercise are not effective.  If you smoke, find out from your caregiver how to quit. If you do not use tobacco, do not start.  If you  choose to drink alcohol, do not exceed 2 drinks per day. One drink is considered to be 12 ounces (355 mL) of beer, 5 ounces (148 mL) of wine, or 1.5 ounces (44 mL) of liquor.  Avoid use of street drugs. Do not share needles with anyone. Ask for help if you need support or instructions about stopping the use of drugs.  High blood pressure causes heart disease and increases the risk of stroke. Blood pressure should be checked at least every 1 to 2 years. Ongoing high blood pressure should be treated with medicines if weight loss and exercise are not effective.  If you are 45 to 71 years old, ask your caregiver if you should take aspirin to prevent heart disease.  Diabetes screening involves taking a blood sample to check your fasting blood sugar level. This should be done once every 3 years, after age 45, if you are within normal weight and without risk factors for diabetes. Testing should be considered at a younger age or be carried out more frequently if you are overweight and have at least 1 risk factor for diabetes.  Colorectal cancer can be detected and often prevented. Most routine colorectal cancer screening begins at the age of 50 and continues through age 75. However, your caregiver may recommend screening at an earlier age if you have risk factors for colon cancer. On a yearly basis, your caregiver may provide home test kits to check for hidden blood in the stool. Use of a small camera at the end of a tube,   to directly examine the colon (sigmoidoscopy or colonoscopy), can detect the earliest forms of colorectal cancer. Talk to your caregiver about this at age 50, when routine screening begins. Direct examination of the colon should be repeated every 5 to 10 years through age 75, unless early forms of pre-cancerous polyps or small growths are found.  Hepatitis C blood testing is recommended for all people born from 1945 through 1965 and any individual with known risks for hepatitis C.  Healthy  men should no longer receive prostate-specific antigen (PSA) blood tests as part of routine cancer screening. Consult with your caregiver about prostate cancer screening.  Testicular cancer screening is not recommended for adolescents or adult males who have no symptoms. Screening includes self-exam, caregiver exam, and other screening tests. Consult with your caregiver about any symptoms you have or any concerns you have about testicular cancer.  Practice safe sex. Use condoms and avoid high-risk sexual practices to reduce the spread of sexually transmitted infections (STIs).  Use sunscreen with a sun protection factor (SPF) of 30 or greater. Apply sunscreen liberally and repeatedly throughout the day. You should seek shade when your shadow is shorter than you. Protect yourself by wearing long sleeves, pants, a wide-brimmed hat, and sunglasses year round, whenever you are outdoors.  Notify your caregiver of new moles or changes in moles, especially if there is a change in shape or color. Also notify your caregiver if a mole is larger than the size of a pencil eraser.  A one-time screening for abdominal aortic aneurysm (AAA) and surgical repair of large AAAs by sound wave imaging (ultrasonography) is recommended for ages 65 to 75 years who are current or former smokers.  Stay current with your immunizations. Document Released: 01/29/2008 Document Revised: 10/25/2011 Document Reviewed: 12/28/2010 [employer] Patient Information 2013 [employer].  

## 2012-10-16 NOTE — Progress Notes (Signed)
Subjective:    Patient ID: Jack Farmer, male    DOB: 07/21/1941, 72 y.o.   MRN: [id number]  Hypertension This is a chronic problem. The current episode started more than 1 year ago. The problem is unchanged. The problem is controlled. Pertinent negatives include no anxiety, blurred vision, chest pain, headaches, malaise/fatigue, neck pain, orthopnea, palpitations, peripheral edema, PND, shortness of breath or sweats. Agents associated with hypertension include NSAIDs. Past treatments include beta blockers, angiotensin blockers and diuretics. The current treatment provides significant improvement. Compliance problems include exercise and diet.  Hypertensive end-organ damage includes CAD/MI.      Review of Systems  Constitutional: Negative for fever, chills, malaise/fatigue, diaphoresis, activity change, appetite change, fatigue and unexpected weight change.  HENT: Negative.  Negative for neck pain.   Eyes: Negative.  Negative for blurred vision.  Respiratory: Negative for apnea, cough, chest tightness, shortness of breath, wheezing and stridor.   Cardiovascular: Negative for chest pain, palpitations, orthopnea, leg swelling and PND.  Gastrointestinal: Negative for nausea, vomiting, abdominal pain, diarrhea, constipation and blood in stool.  Endocrine: Negative.   Genitourinary: Negative.  Negative for frequency, flank pain, scrotal swelling, difficulty urinating and testicular pain.  Musculoskeletal: Negative for myalgias, back pain, joint swelling, arthralgias and gait problem.  Skin: Negative.   Allergic/Immunologic: Negative.   Neurological: Negative for dizziness, weakness, light-headedness and headaches.  Hematological: Negative for adenopathy. Does not bruise/bleed easily.  Psychiatric/Behavioral: Negative.        Objective:   Physical Exam  Vitals reviewed. Constitutional: He is oriented to person, place, and time. He appears well-developed and well-nourished. No distress.   HENT:  Head: Normocephalic and atraumatic.  Mouth/Throat: Oropharynx is clear and moist. No oropharyngeal exudate.  Eyes: Conjunctivae are normal. Right eye exhibits no discharge. Left eye exhibits no discharge. No scleral icterus.  Neck: Normal range of motion. Neck supple. No JVD present. No tracheal deviation present. No thyromegaly present.  Cardiovascular: Normal rate, regular rhythm, normal heart sounds and intact distal pulses.  Exam reveals no gallop and no friction rub.   No murmur heard. Pulmonary/Chest: Effort normal and breath sounds normal. No stridor. No respiratory distress. He has no wheezes. He has no rales. He exhibits no tenderness.  Abdominal: Soft. Bowel sounds are normal. He exhibits no distension and no mass. There is no tenderness. There is no rebound and no guarding. Hernia confirmed negative in the right inguinal area and confirmed negative in the left inguinal area.  Genitourinary: Rectum normal, prostate normal, testes normal and penis normal. Rectal exam shows no external hemorrhoid, no internal hemorrhoid, no fissure, no mass, no tenderness and anal tone normal. Guaiac negative stool. Prostate is not enlarged and not tender. Right testis shows no mass, no swelling and no tenderness. Right testis is descended. Left testis shows no mass, no swelling and no tenderness. Left testis is descended. Circumcised. No penile erythema or penile tenderness. No discharge found.  Musculoskeletal: Normal range of motion. He exhibits edema (trace edema in BLE). He exhibits no tenderness.  Lymphadenopathy:    He has no cervical adenopathy.       Right: No inguinal adenopathy present.       Left: No inguinal adenopathy present.  Neurological: He is oriented to person, place, and time.  Skin: Skin is warm and dry. No rash noted. He is not diaphoretic. No erythema. No pallor.  Psychiatric: He has a normal mood and affect. His behavior is normal. Judgment and thought content normal.  Lab Results  Component Value Date   WBC 5.9 03/31/2012   HGB 16.5 03/31/2012   HCT 45.4 03/31/2012   PLT 168 03/31/2012   GLUCOSE 96 03/31/2012   CHOL 94 08/26/2011   TRIG 96.0 08/26/2011   HDL 27.40* 08/26/2011   LDLDIRECT 69.9 01/11/2007   LDLCALC 47 08/26/2011   ALT 26 08/26/2011   AST 27 08/26/2011   NA 139 03/31/2012   K 4.1 03/31/2012   CL 100 03/31/2012   CREATININE 1.10 03/31/2012   BUN 12 03/31/2012   CO2 30 03/31/2012   TSH 1.03 08/26/2011   PSA 0.85 08/26/2011   INR 0.90 03/31/2012   HGBA1C 5.6 08/26/2011       Assessment & Plan:

## 2012-10-16 NOTE — Assessment & Plan Note (Signed)
His BP is well controlled Today I will check his lytes and renal function 

## 2012-10-16 NOTE — Assessment & Plan Note (Signed)
He is doing well on zocor FLP today

## 2012-10-24 ENCOUNTER — Encounter: Payer: Medicare Other | Admitting: Internal Medicine

## 2012-10-25 ENCOUNTER — Other Ambulatory Visit: Payer: Self-pay | Admitting: Neurological Surgery

## 2012-10-25 DIAGNOSIS — M47817 Spondylosis without myelopathy or radiculopathy, lumbosacral region: Secondary | ICD-10-CM

## 2012-10-26 ENCOUNTER — Encounter: Payer: Medicare Other | Admitting: Internal Medicine

## 2012-10-26 ENCOUNTER — Institutional Professional Consult (permissible substitution): Payer: Medicare Other | Admitting: Cardiovascular Disease

## 2012-10-27 ENCOUNTER — Other Ambulatory Visit: Payer: Self-pay | Admitting: Internal Medicine

## 2012-11-06 ENCOUNTER — Other Ambulatory Visit: Payer: Medicare Other

## 2012-11-06 ENCOUNTER — Ambulatory Visit
Admission: RE | Admit: 2012-11-06 | Discharge: 2012-11-06 | Disposition: A | Discharge status: Home / Self Care | Payer: Medicare Other | Source: Ambulatory Visit | Attending: Neurological Surgery | Admitting: Neurological Surgery

## 2012-11-06 ENCOUNTER — Other Ambulatory Visit: Payer: Self-pay | Admitting: Neurological Surgery

## 2012-11-06 DIAGNOSIS — M47817 Spondylosis without myelopathy or radiculopathy, lumbosacral region: Secondary | ICD-10-CM

## 2012-11-21 ENCOUNTER — Ambulatory Visit (INDEPENDENT_AMBULATORY_CARE_PROVIDER_SITE_OTHER): Payer: Medicare Other | Admitting: Cardiology

## 2012-11-21 ENCOUNTER — Encounter: Payer: Self-pay | Admitting: Cardiology

## 2012-11-21 VITALS — BP 150/78 | HR 80 | Ht 75.0 in | Wt 256.0 lb

## 2012-11-21 DIAGNOSIS — I2510 Atherosclerotic heart disease of native coronary artery without angina pectoris: Secondary | ICD-10-CM

## 2012-11-21 DIAGNOSIS — I10 Essential (primary) hypertension: Secondary | ICD-10-CM

## 2012-11-21 DIAGNOSIS — E785 Hyperlipidemia, unspecified: Secondary | ICD-10-CM

## 2012-11-21 MED ORDER — METOPROLOL TARTRATE 50 MG PO TABS: 50.0000 mg | ORAL_TABLET | Freq: Two times a day (BID) | ORAL | Status: DC

## 2012-11-21 NOTE — Assessment & Plan Note (Signed)
Continue aspirin and statin. I will arrange a Lexiscan Myoview for risk stratification.

## 2012-11-21 NOTE — Patient Instructions (Addendum)
Your physician recommends that you schedule a follow-up appointment in: AS NEEDED PENDING TEST RESULTS  Your physician has requested that you have a lexiscan myoview. For further information please visit [email or web address] Please follow instruction sheet, as given.   INCREASE METOPROLOL TO 50 MG TWICE DAILY

## 2012-11-21 NOTE — Assessment & Plan Note (Signed)
Blood pressure elevated. Increase metoprolol to 50 mg by mouth twice a day. 

## 2012-11-21 NOTE — Assessment & Plan Note (Signed)
Continue statin. Lipids and liver monitored by primary care. 

## 2012-11-21 NOTE — Progress Notes (Signed)
HPI: Pleasant gentleman I have previously seen for coronary artery disease. He is status post cardiac catheterization on  November 29, 2006. He was found to have 20% proximal LAD, a 40-50% circumflex in the distal circumflex and a 40-50% mid circumflex lesion. There was a 50% right coronary artery and otherwise nonobstructive disease. His ejection fraction was 65%. Abdominal ultrasound in August of 2010 showed no aneurysm. I last saw him in July of 2010. Since then he has dyspnea with more moderate activities. Note he he has limited mobility because of back problems. There is no orthopnea, PND, pedal edema, chest pain or syncope.   Current Outpatient Prescriptions  Medication Sig Dispense Refill  . amitriptyline (ELAVIL) 50 MG tablet Take 50 mg by mouth at bedtime.       . Ascorbic Acid (VITAMIN C) 1000 MG tablet Take 1,000 mg by mouth daily.      Marland Kitchen aspirin EC 81 MG tablet Take 81 mg by mouth daily.      . calcium carbonate (OS-CAL) 600 MG TABS Take 600 mg by mouth daily.      . Coenzyme Q10 (CO Q-10) 200 MG CAPS Take 1 tablet by mouth daily.       Marland Kitchen losartan-hydrochlorothiazide (HYZAAR) 100-12.5 MG per tablet Take 1 tablet by mouth daily.  90 tablet  1  . methocarbamol (ROBAXIN) 500 MG tablet Take 500 mg by mouth daily.      . metoprolol tartrate (LOPRESSOR) 25 MG tablet Take 1 tablet (25 mg total) by mouth 2 (two) times daily.  5 tablet  0  . Omega-3 Fatty Acids (FISH OIL) 1200 MG CAPS Take 1,200 mg by mouth daily.      . simvastatin (ZOCOR) 40 MG tablet Take 1 tablet (40 mg total) by mouth every evening.  90 tablet  3  . tadalafil (CIALIS) 20 MG tablet Take 1 tablet (20 mg total) by mouth daily as needed for erectile dysfunction.  6 tablet  11  . vitamin B-12 (CYANOCOBALAMIN) 1000 MCG tablet Take 1,000 mcg by mouth daily.      . vitamin D, CHOLECALCIFEROL, 400 UNITS tablet Take 400 Units by mouth daily.      . vitamin E 400 UNIT capsule Take 400 Units by mouth daily.       No current  facility-administered medications for this visit.    Allergies  Allergen Reactions  . Cephalexin Anaphylaxis  . Indomethacin Other (See Comments)     Gi bleed    Past Medical History  Diagnosis Date  . Hypertension   . Hyperlipidemia   . Low HDL (under 40)   . CAD (coronary artery disease)   . Hypertrophy of prostate with urinary obstruction and other lower urinary tract symptoms (LUTS)   . Low back pain   . GERD (gastroesophageal reflux disease)     Past Surgical History  Procedure Laterality Date  . Eye surgery      tightened muscles in right eye  . Anterior lat lumbar fusion  04/07/2012    Procedure: ANTERIOR LATERAL LUMBAR FUSION 2 LEVELS;  Surgeon: Tia Alert, MD;  Location: [facility];  Service: Neurosurgery;  Laterality: Right;  Lumbar three-four and four-five extreme lumbar interbody fusion  . Tonsillectomy      History   Social History  . Marital Status: Married    Spouse Name: N/A    Number of Children: 4  . Years of Education: N/A   Occupational History  . Not on file.  Social History Main Topics  . Smoking status: Former Smoker    Quit date: 08/16/2006  . Smokeless tobacco: Not on file  . Alcohol Use: Yes     Comment: Rare  . Drug Use: No  . Sexually Active: Yes   Other Topics Concern  . Not on file   Social History Narrative  . No narrative on file    Family History  Problem Relation Age of Onset  . Alcohol abuse Other   . Drug abuse Other   . Arthritis Other   . Hypertension Other     ROS: Chronic back pain but no fevers or chills, productive cough, hemoptysis, dysphasia, odynophagia, melena, hematochezia, dysuria, hematuria, rash, seizure activity, orthopnea, PND, pedal edema, claudication. Remaining systems are negative.  Physical Exam:   Blood pressure 150/78, pulse 80, height 6\' 3"  (1.905 m), weight 256 lb (116.121 kg).  General:  Well developed/well nourished in NAD Skin warm/dry Patient not depressed No peripheral  clubbing Back-normal HEENT-normal/normal eyelids Neck supple/normal carotid upstroke bilaterally; no bruits; no JVD; no thyromegaly chest - CTA/ normal expansion CV - RRR/normal S1 and S2; no murmurs, rubs or gallops;  PMI nondisplaced Abdomen -NT/ND, no HSM, no mass, + bowel sounds, no bruit 2+ femoral pulses, no bruits Ext-no edema, chords, 2+ DP Neuro-grossly nonfocal  ECG NSR, lateral TWI

## 2012-11-27 ENCOUNTER — Other Ambulatory Visit: Payer: Self-pay | Admitting: Internal Medicine

## 2012-11-29 LAB — HM DIABETES EYE EXAM: HM Diabetic Eye Exam: NORMAL

## 2012-11-30 ENCOUNTER — Ambulatory Visit (HOSPITAL_COMMUNITY): Payer: Medicare Other | Attending: Cardiology | Admitting: Radiology

## 2012-11-30 VITALS — BP 141/89 | HR 69 | Ht 75.0 in | Wt 252.0 lb

## 2012-11-30 DIAGNOSIS — I2510 Atherosclerotic heart disease of native coronary artery without angina pectoris: Secondary | ICD-10-CM

## 2012-11-30 DIAGNOSIS — I10 Essential (primary) hypertension: Secondary | ICD-10-CM | POA: Insufficient documentation

## 2012-11-30 DIAGNOSIS — R0602 Shortness of breath: Secondary | ICD-10-CM

## 2012-11-30 DIAGNOSIS — R0989 Other specified symptoms and signs involving the circulatory and respiratory systems: Secondary | ICD-10-CM | POA: Insufficient documentation

## 2012-11-30 DIAGNOSIS — R0609 Other forms of dyspnea: Secondary | ICD-10-CM | POA: Insufficient documentation

## 2012-11-30 DIAGNOSIS — E785 Hyperlipidemia, unspecified: Secondary | ICD-10-CM | POA: Insufficient documentation

## 2012-11-30 DIAGNOSIS — E663 Overweight: Secondary | ICD-10-CM | POA: Insufficient documentation

## 2012-11-30 DIAGNOSIS — Z87891 Personal history of nicotine dependence: Secondary | ICD-10-CM | POA: Insufficient documentation

## 2012-11-30 MED ORDER — TECHNETIUM TC 99M SESTAMIBI GENERIC - CARDIOLITE
10.0000 | Freq: Once | INTRAVENOUS | Status: AC | PRN
  Administered 2012-11-30: 10 via INTRAVENOUS

## 2012-11-30 MED ORDER — REGADENOSON 0.4 MG/5ML IV SOLN
0.4000 mg | Freq: Once | INTRAVENOUS | Status: AC
  Administered 2012-11-30: 0.4 mg via INTRAVENOUS

## 2012-11-30 MED ORDER — TECHNETIUM TC 99M SESTAMIBI GENERIC - CARDIOLITE
30.0000 | Freq: Once | INTRAVENOUS | Status: AC | PRN
  Administered 2012-11-30: 30 via INTRAVENOUS

## 2012-11-30 NOTE — Progress Notes (Signed)
[facility] SITE 3 NUCLEAR MED [address] [place], [place] [address] [phone]    Cardiology Nuclear Med Study  Jack Farmer is a 72 y.o. male     MRN : [id number]     DOB: 06-19-1941  Procedure Date: 11/30/2012  Nuclear Med Background Indication for Stress Test:  Evaluation for Ischemia History:  '09 Cath:moderate CAD, EF=65%+ Cardiac Risk Factors: History of Smoking, Hypertension, Lipids and Overweight  Symptoms:  DOE and Rapid HR   Nuclear Pre-Procedure Caffeine/Decaff Intake:  None NPO After: 9:30pm   Lungs:  Clear. O2 Sat: 96% on room air. IV 0.9% NS with Angio Cath:  22g  IV Site: R Forearm  IV Started by:  Stanton Kidney, EMT-P  Chest Size (in):  46 Cup Size: n/a  Height: 6\' 3"  (1.905 m)  Weight:  252 lb (114.306 kg)  BMI:  Body mass index is 31.5 kg/(m^2). Tech Comments:  Med's at 7am, per patient.    Nuclear Med Study 1 or 2 day study: 1 day  Stress Test Type:  Eugenie Birks  Reading MD: Marca Ancona, MD  Order Authorizing Provider:  Olga Millers, MD  Resting Radionuclide: Technetium 74m Sestamibi  Resting Radionuclide Dose: 11.0 mCi   Stress Radionuclide:  Technetium 37m Sestamibi  Stress Radionuclide Dose: 33.0 mCi           Stress Protocol Rest HR: 69 Stress HR: 88  Rest BP: 141/89 Stress BP: 165/89  Exercise Time (min): n/a METS: n/a   Predicted Max HR: 149 bpm % Max HR: 59.06 bpm Rate Pressure Product: 42595   Dose of Adenosine (mg):  n/a Dose of Lexiscan: 0.4 mg  Dose of Atropine (mg): n/a Dose of Dobutamine: n/a mcg/kg/min (at max HR)  Stress Test Technologist: Smiley Houseman, CMA-N  Nuclear Technologist:  Domenic Polite, CNMT     Rest Procedure:  Myocardial perfusion imaging was performed at rest 45 minutes following the intravenous administration of Technetium 45m Sestamibi.  Rest ECG: NSR - Normal EKG  Stress Procedure:  The patient received IV Lexiscan 0.4 mg over 15-seconds.  Technetium 25m Sestamibi injected at  30-seconds.  Patient c/o shortness of breath with Lexiscan.  Quantitative spect images were obtained after a 45 minute delay.  Stress ECG: No significant change from baseline ECG  QPS Raw Data Images:  Normal; no motion artifact; normal heart/lung ratio. Stress Images:  Normal homogeneous uptake in all areas of the myocardium. Rest Images:  Normal homogeneous uptake in all areas of the myocardium. Subtraction (SDS):  There is no evidence of scar or ischemia. Transient Ischemic Dilatation (Normal <1.22):  0.97 Lung/Heart Ratio (Normal <0.45):  0.43  Quantitative Gated Spect Images QGS EDV:  101 ml QGS ESV:  43 ml  Impression Exercise Capacity:  Lexiscan with no exercise. BP Response:  Normal blood pressure response. Clinical Symptoms:  Short of breath.  ECG Impression:  No significant ST segment change suggestive of ischemia. Comparison with Prior Nuclear Study: No previous nuclear study performed  Overall Impression:  Normal stress nuclear study.  LV Ejection Fraction: 58%.  LV Wall Motion:  NL LV Function; NL Wall Motion  Marca Ancona 11/30/2012

## 2012-12-06 ENCOUNTER — Telehealth: Payer: Self-pay | Admitting: Cardiology

## 2012-12-06 NOTE — Telephone Encounter (Signed)
**Note De-Identified  Obfuscation** Pt given results of normal stress test, he verbalized understanding.

## 2012-12-06 NOTE — Telephone Encounter (Signed)
New problem    Pt calling to get test results

## 2013-02-08 ENCOUNTER — Other Ambulatory Visit: Payer: Self-pay | Admitting: Internal Medicine

## 2013-03-20 ENCOUNTER — Other Ambulatory Visit: Payer: Self-pay | Admitting: *Deleted

## 2013-03-20 DIAGNOSIS — I2510 Atherosclerotic heart disease of native coronary artery without angina pectoris: Secondary | ICD-10-CM

## 2013-03-20 DIAGNOSIS — I10 Essential (primary) hypertension: Secondary | ICD-10-CM

## 2013-03-20 MED ORDER — METOPROLOL TARTRATE 50 MG PO TABS: 50.0000 mg | ORAL_TABLET | Freq: Two times a day (BID) | ORAL | Status: DC

## 2013-03-21 ENCOUNTER — Other Ambulatory Visit: Payer: Self-pay

## 2013-03-26 ENCOUNTER — Other Ambulatory Visit: Payer: Self-pay | Admitting: *Deleted

## 2013-03-26 DIAGNOSIS — I10 Essential (primary) hypertension: Secondary | ICD-10-CM

## 2013-03-26 DIAGNOSIS — I2510 Atherosclerotic heart disease of native coronary artery without angina pectoris: Secondary | ICD-10-CM

## 2013-03-26 MED ORDER — METOPROLOL TARTRATE 50 MG PO TABS: 50.0000 mg | ORAL_TABLET | Freq: Two times a day (BID) | ORAL | Status: DC

## 2013-06-14 IMAGING — RF DG LUMBAR SPINE 2-3V
1 series · 2 of 2 positions shown · non-contrast
Comparison: 03/13/2012

CLINICAL DATA: LUMBAR FUSION.

DG C-ARM GT 120 MIN,LUMBAR SPINE - 2-3 VIEW

[Series 1: run · 2 of 2 slices shown]
[im 1/2]
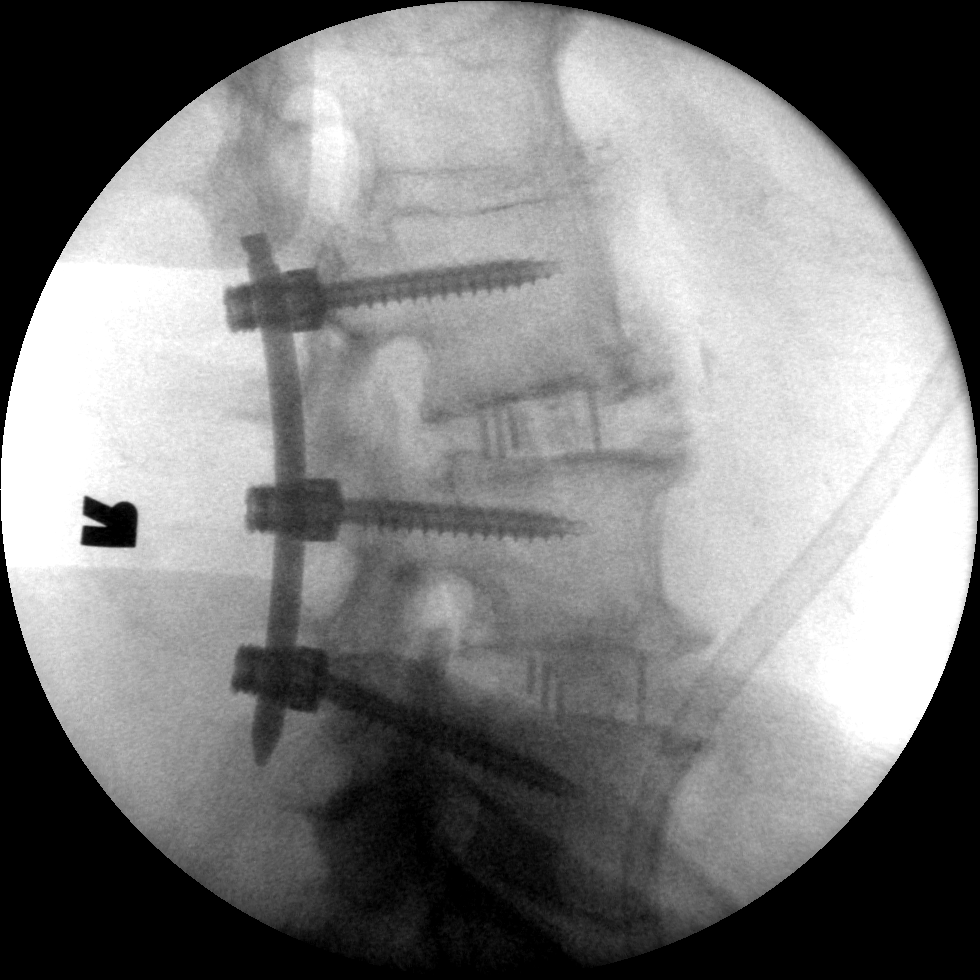
[im 2/2]
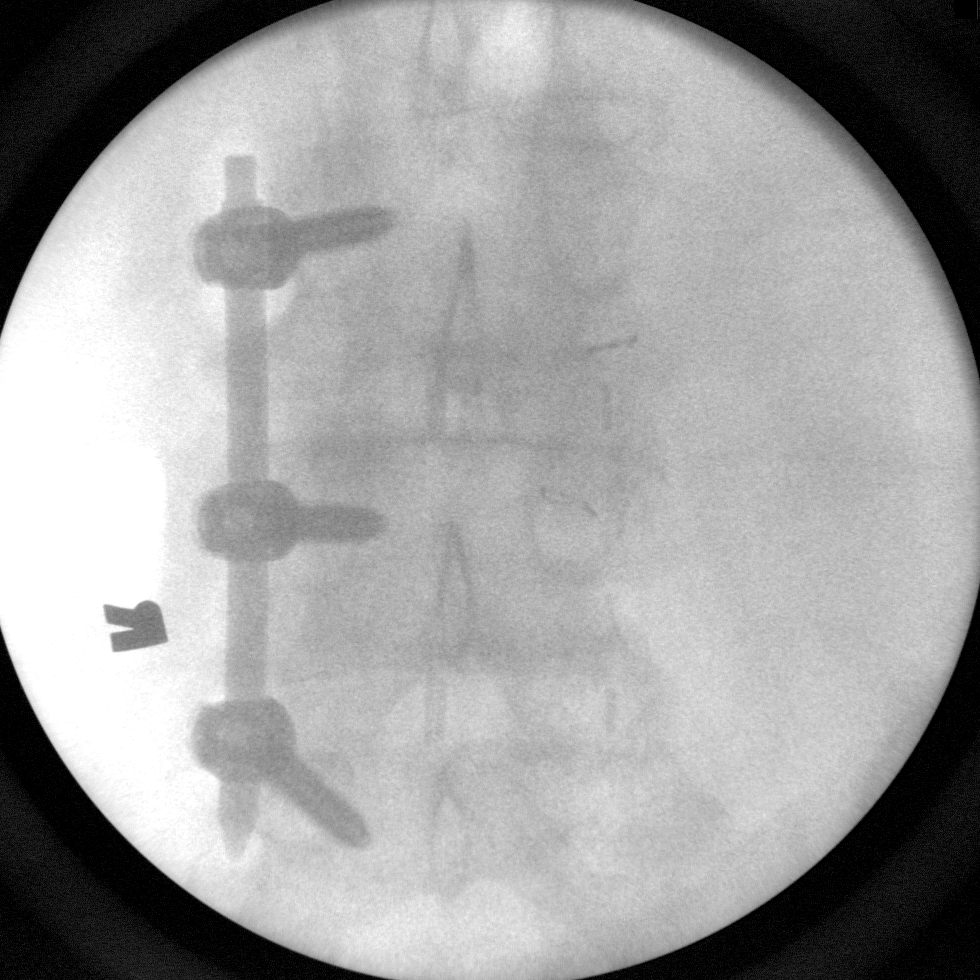

[2 of 2 positions shown; findings below may reference images not displayed]

FINDINGS: Two intraoperative spot images demonstrate to level
fusion in the lumbar spine, reportedly L3-L5.  Right pedicle screws
noted.  No hardware bony complicating feature.
IMPRESSION: L3-L5 unilateral right posterior fusion.

## 2013-06-19 ENCOUNTER — Encounter: Payer: Self-pay | Admitting: Internal Medicine

## 2013-06-19 ENCOUNTER — Other Ambulatory Visit (INDEPENDENT_AMBULATORY_CARE_PROVIDER_SITE_OTHER): Payer: Medicare Other

## 2013-06-19 ENCOUNTER — Ambulatory Visit (INDEPENDENT_AMBULATORY_CARE_PROVIDER_SITE_OTHER): Payer: Medicare Other | Admitting: Internal Medicine

## 2013-06-19 VITALS — BP 136/80 | HR 77 | Temp 97.8°F | Resp 16 | Ht 75.0 in | Wt 256.8 lb

## 2013-06-19 DIAGNOSIS — R0989 Other specified symptoms and signs involving the circulatory and respiratory systems: Secondary | ICD-10-CM

## 2013-06-19 DIAGNOSIS — Z0000 Encounter for general adult medical examination without abnormal findings: Secondary | ICD-10-CM

## 2013-06-19 DIAGNOSIS — R7309 Other abnormal glucose: Secondary | ICD-10-CM

## 2013-06-19 DIAGNOSIS — I10 Essential (primary) hypertension: Secondary | ICD-10-CM

## 2013-06-19 DIAGNOSIS — R739 Hyperglycemia, unspecified: Secondary | ICD-10-CM

## 2013-06-19 DIAGNOSIS — I700 Atherosclerosis of aorta: Secondary | ICD-10-CM

## 2013-06-19 LAB — BASIC METABOLIC PANEL
Calcium: 9.3 mg/dL (ref 8.4–10.5)
GFR: 73.04 mL/min (ref >60.00)
Glucose, Bld: 101 mg/dL — ABNORMAL HIGH (ref 70–99)
Sodium: 135 mEq/L (ref 135–145)

## 2013-06-19 LAB — URINALYSIS, ROUTINE W REFLEX MICROSCOPIC
Bilirubin Urine: NEGATIVE
Hgb urine dipstick: NEGATIVE
Ketones, ur: NEGATIVE
Leukocytes, UA: NEGATIVE
Urine Glucose: NEGATIVE
Urobilinogen, UA: 0.2 (ref 0.0–1.0)

## 2013-06-19 NOTE — Progress Notes (Signed)
Pre-visit discussion using our clinic review tool. No additional management support is needed unless otherwise documented below in the visit note.  

## 2013-06-19 NOTE — Assessment & Plan Note (Signed)
He has pre-diabetes, he will lose weight and exercise more

## 2013-06-19 NOTE — Assessment & Plan Note (Signed)
His BP is adequately well controlled He will work to improve his lifestyle modifications

## 2013-06-19 NOTE — Assessment & Plan Note (Signed)
I will check a duplex scan to see if he has an AAA

## 2013-06-19 NOTE — Patient Instructions (Signed)

## 2013-06-19 NOTE — Progress Notes (Signed)
Subjective:    Patient ID: Jack Farmer, male    DOB: 01/19/1941, 72 y.o.   MRN: [id number]  Hypertension This is a chronic problem. The current episode started more than 1 year ago. The problem is unchanged. The problem is controlled. Pertinent negatives include no anxiety, blurred vision, chest pain, headaches, malaise/fatigue, neck pain, orthopnea, palpitations, peripheral edema, PND, shortness of breath or sweats. Past treatments include angiotensin blockers, beta blockers and diuretics. The current treatment provides moderate improvement. Compliance problems include exercise and diet.  Hypertensive end-organ damage includes CAD/MI.      Review of Systems  Constitutional: Negative.  Negative for fever, chills, malaise/fatigue, diaphoresis, activity change, appetite change, fatigue and unexpected weight change.  HENT: Negative.  Negative for congestion and dental problem.   Eyes: Negative.  Negative for blurred vision.  Respiratory: Negative.  Negative for choking, chest tightness, shortness of breath, wheezing and stridor.   Cardiovascular: Negative.  Negative for chest pain, palpitations, orthopnea, leg swelling and PND.  Gastrointestinal: Negative.  Negative for nausea, vomiting, abdominal pain, diarrhea, constipation and blood in stool.  Endocrine: Negative.   Genitourinary: Negative.   Musculoskeletal: Negative.  Negative for arthralgias, back pain, gait problem, joint swelling, myalgias, neck pain and neck stiffness.  Skin: Negative.   Allergic/Immunologic: Negative.   Neurological: Negative.  Negative for dizziness, light-headedness and headaches.  Hematological: Negative.  Negative for adenopathy. Does not bruise/bleed easily.  Psychiatric/Behavioral: Negative.        Objective:   Physical Exam  Vitals reviewed. Constitutional: He is oriented to person, place, and time. He appears well-developed and well-nourished. No distress.  HENT:  Head: Normocephalic and  atraumatic.  Mouth/Throat: Oropharynx is clear and moist. No oropharyngeal exudate.  Eyes: Conjunctivae are normal. Right eye exhibits no discharge. Left eye exhibits no discharge. No scleral icterus.  Neck: Normal range of motion. Neck supple. No JVD present. No tracheal deviation present. No thyromegaly present.  Cardiovascular: Normal rate, regular rhythm, normal heart sounds and intact distal pulses.  Exam reveals no gallop and no friction rub.   No murmur heard. Pulmonary/Chest: Effort normal and breath sounds normal. No stridor. No respiratory distress. He has no wheezes. He has no rales. He exhibits no tenderness.  Abdominal: Soft. Normal appearance and bowel sounds are normal. He exhibits abdominal bruit. He exhibits no shifting dullness, no distension, no pulsatile liver, no fluid wave, no ascites, no pulsatile midline mass and no mass. There is no hepatosplenomegaly, splenomegaly or hepatomegaly. There is no tenderness. There is no rigidity, no rebound, no guarding, no CVA tenderness, no tenderness at McBurney's point and negative Murphy's sign. No hernia. Hernia confirmed negative in the ventral area.  Musculoskeletal: Normal range of motion. He exhibits no edema and no tenderness.  Lymphadenopathy:    He has no cervical adenopathy.  Neurological: He is oriented to person, place, and time.  Skin: Skin is warm and dry. No rash noted. He is not diaphoretic. No erythema. No pallor.  Psychiatric: He has a normal mood and affect. His behavior is normal. Judgment and thought content normal.    Lab Results  Component Value Date   WBC 5.1 10/16/2012   HGB 15.8 10/16/2012   HCT 46.7 10/16/2012   PLT 170.0 10/16/2012   GLUCOSE 112* 10/16/2012   CHOL 108 10/16/2012   TRIG 215.0* 10/16/2012   HDL 25.20* 10/16/2012   LDLDIRECT 56.3 10/16/2012   LDLCALC 47 08/26/2011   ALT 30 10/16/2012   AST 22 10/16/2012   NA 138  10/16/2012   K 4.1 10/16/2012   CL 101 10/16/2012   CREATININE 1.2 10/16/2012   BUN 15 10/16/2012   CO2  27 10/16/2012   TSH 1.32 10/16/2012   PSA 0.77 10/16/2012   INR 0.90 03/31/2012   HGBA1C 5.8 10/16/2012        Assessment & Plan:

## 2013-06-20 ENCOUNTER — Telehealth: Payer: Self-pay | Admitting: Internal Medicine

## 2013-06-20 NOTE — Telephone Encounter (Signed)
Pt called and stated that he was in yesterday for an office visit and forgot to ask about sore throat. Pt request for Dr. Yetta Barre to call in for magic mouthwash because pt's throat is dry at night. Please advise

## 2013-06-25 ENCOUNTER — Ambulatory Visit (HOSPITAL_COMMUNITY): Payer: Medicare Other | Attending: Internal Medicine

## 2013-06-25 ENCOUNTER — Encounter: Payer: Self-pay | Admitting: Internal Medicine

## 2013-06-25 ENCOUNTER — Encounter (HOSPITAL_COMMUNITY): Payer: Medicare Other

## 2013-06-25 DIAGNOSIS — E785 Hyperlipidemia, unspecified: Secondary | ICD-10-CM | POA: Insufficient documentation

## 2013-06-25 DIAGNOSIS — I700 Atherosclerosis of aorta: Secondary | ICD-10-CM

## 2013-06-25 DIAGNOSIS — I2510 Atherosclerotic heart disease of native coronary artery without angina pectoris: Secondary | ICD-10-CM | POA: Insufficient documentation

## 2013-06-25 DIAGNOSIS — I10 Essential (primary) hypertension: Secondary | ICD-10-CM | POA: Insufficient documentation

## 2013-06-25 DIAGNOSIS — R0989 Other specified symptoms and signs involving the circulatory and respiratory systems: Secondary | ICD-10-CM

## 2013-06-25 DIAGNOSIS — Z87891 Personal history of nicotine dependence: Secondary | ICD-10-CM | POA: Insufficient documentation

## 2013-06-25 HISTORY — DX: Atherosclerosis of aorta: I70.0

## 2013-06-27 ENCOUNTER — Encounter (HOSPITAL_COMMUNITY): Payer: Medicare Other

## 2013-07-24 ENCOUNTER — Other Ambulatory Visit: Payer: Self-pay | Admitting: Internal Medicine

## 2013-10-22 ENCOUNTER — Other Ambulatory Visit: Payer: Self-pay | Admitting: Internal Medicine

## 2014-01-19 ENCOUNTER — Other Ambulatory Visit: Payer: Self-pay | Admitting: Internal Medicine

## 2014-02-01 ENCOUNTER — Other Ambulatory Visit (INDEPENDENT_AMBULATORY_CARE_PROVIDER_SITE_OTHER): Payer: Medicare Other

## 2014-02-01 ENCOUNTER — Ambulatory Visit (INDEPENDENT_AMBULATORY_CARE_PROVIDER_SITE_OTHER): Payer: Medicare Other | Admitting: Internal Medicine

## 2014-02-01 ENCOUNTER — Encounter: Payer: Self-pay | Admitting: Internal Medicine

## 2014-02-01 VITALS — BP 138/82 | HR 72 | Temp 97.8°F | Resp 18 | Wt 254.0 lb

## 2014-02-01 DIAGNOSIS — R7309 Other abnormal glucose: Secondary | ICD-10-CM

## 2014-02-01 DIAGNOSIS — I2510 Atherosclerotic heart disease of native coronary artery without angina pectoris: Secondary | ICD-10-CM

## 2014-02-01 DIAGNOSIS — N401 Enlarged prostate with lower urinary tract symptoms: Secondary | ICD-10-CM

## 2014-02-01 DIAGNOSIS — E785 Hyperlipidemia, unspecified: Secondary | ICD-10-CM

## 2014-02-01 DIAGNOSIS — I10 Essential (primary) hypertension: Secondary | ICD-10-CM

## 2014-02-01 DIAGNOSIS — K219 Gastro-esophageal reflux disease without esophagitis: Secondary | ICD-10-CM

## 2014-02-01 DIAGNOSIS — N138 Other obstructive and reflux uropathy: Secondary | ICD-10-CM

## 2014-02-01 DIAGNOSIS — Z0000 Encounter for general adult medical examination without abnormal findings: Secondary | ICD-10-CM

## 2014-02-01 DIAGNOSIS — R739 Hyperglycemia, unspecified: Secondary | ICD-10-CM

## 2014-02-01 DIAGNOSIS — E786 Lipoprotein deficiency: Secondary | ICD-10-CM

## 2014-02-01 LAB — CBC WITH DIFFERENTIAL/PLATELET
BASOS ABS: 0 10*3/uL (ref 0.0–0.1)
Basophils Relative: 0.4 % (ref 0.0–3.0)
EOS ABS: 0.1 10*3/uL (ref 0.0–0.7)
Eosinophils Relative: 1.5 % (ref 0.0–5.0)
HCT: 47.7 % (ref 39.0–52.0)
Hemoglobin: 16.4 g/dL (ref 13.0–17.0)
LYMPHS PCT: 33.3 % (ref 12.0–46.0)
Lymphs Abs: 2.1 10*3/uL (ref 0.7–4.0)
MCHC: 34.3 g/dL (ref 30.0–36.0)
MCV: 92.4 fl (ref 78.0–100.0)
Monocytes Absolute: 0.4 10*3/uL (ref 0.1–1.0)
Monocytes Relative: 7.2 % (ref 3.0–12.0)
NEUTROS ABS: 3.6 10*3/uL (ref 1.4–7.7)
NEUTROS PCT: 57.6 % (ref 43.0–77.0)
Platelets: 179 10*3/uL (ref 150.0–400.0)
RBC: 5.16 Mil/uL (ref 4.22–5.81)
RDW: 13.4 % (ref 11.5–15.5)
WBC: 6.3 10*3/uL (ref 4.0–10.5)

## 2014-02-01 LAB — HEMOGLOBIN A1C: Hgb A1c MFr Bld: 5.9 % (ref 4.6–6.5)

## 2014-02-01 LAB — COMPREHENSIVE METABOLIC PANEL
ALT: 22 U/L (ref 0–53)
AST: 23 U/L (ref 0–37)
Albumin: 4.3 g/dL (ref 3.5–5.2)
Alkaline Phosphatase: 76 U/L (ref 39–117)
BUN: 21 mg/dL (ref 6–23)
CALCIUM: 9.5 mg/dL (ref 8.4–10.5)
CHLORIDE: 99 meq/L (ref 96–112)
CO2: 29 mEq/L (ref 19–32)
Creatinine, Ser: 1.3 mg/dL (ref 0.4–1.5)
GFR: 58.13 mL/min — ABNORMAL LOW (ref >60.00)
Glucose, Bld: 102 mg/dL — ABNORMAL HIGH (ref 70–99)
Potassium: 4.2 mEq/L (ref 3.5–5.1)
SODIUM: 135 meq/L (ref 135–145)
Total Bilirubin: 1.6 mg/dL — ABNORMAL HIGH (ref 0.2–1.2)
Total Protein: 7 g/dL (ref 6.0–8.3)

## 2014-02-01 LAB — LIPID PANEL
Cholesterol: 103 mg/dL (ref 0–200)
HDL: 27.9 mg/dL — ABNORMAL LOW (ref >39.00)
LDL Cholesterol: 38 mg/dL (ref 0–99)
NonHDL: 75.1
Total CHOL/HDL Ratio: 4
Triglycerides: 185 mg/dL — ABNORMAL HIGH (ref 0.0–149.0)
VLDL: 37 mg/dL (ref 0.0–40.0)

## 2014-02-01 LAB — TSH: TSH: 1.74 u[IU]/mL (ref 0.35–4.50)

## 2014-02-01 LAB — PSA: PSA: 1 ng/mL (ref 0.10–4.00)

## 2014-02-01 LAB — FECAL OCCULT BLOOD, GUAIAC: Fecal Occult Blood: NEGATIVE

## 2014-02-01 NOTE — Patient Instructions (Signed)
Health Maintenance A healthy lifestyle and preventative care can promote health and wellness.  Maintain regular health, dental, and eye exams.  Eat a healthy diet. Foods like vegetables, fruits, whole grains, low-fat dairy products, and lean protein foods contain the nutrients you need and are low in calories. Decrease your intake of foods high in solid fats, added sugars, and salt. Get information about a proper diet from your health care provider, if necessary.  Regular physical exercise is one of the most important things you can do for your health. Most adults should get at least 150 minutes of moderate-intensity exercise (any activity that increases your heart rate and causes you to sweat) each week. In addition, most adults need muscle-strengthening exercises on 2 or more days a week.   Maintain a healthy weight. The body mass index (BMI) is a screening tool to identify possible weight problems. It provides an estimate of body fat based on height and weight. Your health care provider can find your BMI and can help you achieve or maintain a healthy weight. For males 20 years and older:  A BMI below 18.5 is considered underweight.  A BMI of 18.5 to 24.9 is normal.  A BMI of 25 to 29.9 is considered overweight.  A BMI of 30 and above is considered obese.  Maintain normal blood lipids and cholesterol by exercising and minimizing your intake of saturated fat. Eat a balanced diet with plenty of fruits and vegetables. Blood tests for lipids and cholesterol should begin at age 20 and be repeated every 5 years. If your lipid or cholesterol levels are high, you are over age 50, or you are at high risk for heart disease, you may need your cholesterol levels checked more frequently.Ongoing high lipid and cholesterol levels should be treated with medicines if diet and exercise are not working.  If you smoke, find out from your health care provider how to quit. If you do not use tobacco, do not  start.  Lung cancer screening is recommended for adults aged 55-80 years who are at high risk for developing lung cancer because of a history of smoking. A yearly low-dose CT scan of the lungs is recommended for people who have at least a 30-pack-year history of smoking and are current smokers or have quit within the past 15 years. A pack year of smoking is smoking an average of 1 pack of cigarettes a day for 1 year (for example, a 30-pack-year history of smoking could mean smoking 1 pack a day for 30 years or 2 packs a day for 15 years). Yearly screening should continue until the smoker has stopped smoking for at least 15 years. Yearly screening should be stopped for people who develop a health problem that would prevent them from having lung cancer treatment.  If you choose to drink alcohol, do not have more than 2 drinks per day. One drink is considered to be 12 oz (360 mL) of beer, 5 oz (150 mL) of wine, or 1.5 oz (45 mL) of liquor.  Avoid the use of street drugs. Do not share needles with anyone. Ask for help if you need support or instructions about stopping the use of drugs.  High blood pressure causes heart disease and increases the risk of stroke. Blood pressure should be checked at least every 1-2 years. Ongoing high blood pressure should be treated with medicines if weight loss and exercise are not effective.  If you are 45-79 years old, ask your health care provider if   you should take aspirin to prevent heart disease.  Diabetes screening involves taking a blood sample to check your fasting blood sugar level. This should be done once every 3 years after age 45 if you are at a normal weight and without risk factors for diabetes. Testing should be considered at a younger age or be carried out more frequently if you are overweight and have at least 1 risk factor for diabetes.  Colorectal cancer can be detected and often prevented. Most routine colorectal cancer screening begins at the age of 50  and continues through age 75. However, your health care provider may recommend screening at an earlier age if you have risk factors for colon cancer. On a yearly basis, your health care provider may provide home test kits to check for hidden blood in the stool. A small camera at the end of a tube may be used to directly examine the colon (sigmoidoscopy or colonoscopy) to detect the earliest forms of colorectal cancer. Talk to your health care provider about this at age 50 when routine screening begins. A direct exam of the colon should be repeated every 5-10 years through age 75, unless early forms of precancerous polyps or small growths are found.  People who are at an increased risk for hepatitis B should be screened for this virus. You are considered at high risk for hepatitis B if:  You were born in a country where hepatitis B occurs often. Talk with your health care provider about which countries are considered high risk.  Your parents were born in a high-risk country and you have not received a shot to protect against hepatitis B (hepatitis B vaccine).  You have HIV or AIDS.  You use needles to inject street drugs.  You live with, or have sex with, someone who has hepatitis B.  You are a man who has sex with other men (MSM).  You get hemodialysis treatment.  You take certain medicines for conditions like cancer, organ transplantation, and autoimmune conditions.  Hepatitis C blood testing is recommended for all people born from 1945 through 1965 and any individual with known risk factors for hepatitis C.  Healthy men should no longer receive prostate-specific antigen (PSA) blood tests as part of routine cancer screening. Talk to your health care provider about prostate cancer screening.  Testicular cancer screening is not recommended for adolescents or adult males who have no symptoms. Screening includes self-exam, a health care provider exam, and other screening tests. Consult with your  health care provider about any symptoms you have or any concerns you have about testicular cancer.  Practice safe sex. Use condoms and avoid high-risk sexual practices to reduce the spread of sexually transmitted infections (STIs).  You should be screened for STIs, including gonorrhea and chlamydia if:  You are sexually active and are younger than 24 years.  You are older than 24 years, and your health care provider tells you that you are at risk for this type of infection.  Your sexual activity has changed since you were last screened, and you are at an increased risk for chlamydia or gonorrhea. Ask your health care provider if you are at risk.  If you are at risk of being infected with HIV, it is recommended that you take a prescription medicine daily to prevent HIV infection. This is called pre-exposure prophylaxis (PrEP). You are considered at risk if:  You are a man who has sex with other men (MSM).  You are a heterosexual man who   is sexually active with multiple partners.  You take drugs by injection.  You are sexually active with a partner who has HIV.  Talk with your health care provider about whether you are at high risk of being infected with HIV. If you choose to begin PrEP, you should first be tested for HIV. You should then be tested every 3 months for as long as you are taking PrEP.  Use sunscreen. Apply sunscreen liberally and repeatedly throughout the day. You should seek shade when your shadow is shorter than you. Protect yourself by wearing long sleeves, pants, a wide-brimmed hat, and sunglasses year round whenever you are outdoors.  Tell your health care provider of new moles or changes in moles, especially if there is a change in shape or color. Also, tell your health care provider if a mole is larger than the size of a pencil eraser.  A one-time screening for abdominal aortic aneurysm (AAA) and surgical repair of large AAAs by ultrasound is recommended for men aged  65-75 years who are current or former smokers.  Stay current with your vaccines (immunizations). Document Released: 01/29/2008 Document Revised: 08/07/2013 Document Reviewed: 12/28/2010 Exit[employer] Patient Information 2015 [employer]. This information is not intended to replace advice given to you by your health care provider. Make sure you discuss any questions you have with your health care provider. Hypertension Hypertension, commonly called high blood pressure, is when the force of blood pumping through your arteries is too strong. Your arteries are the blood vessels that carry blood from your heart throughout your body. A blood pressure reading consists of a higher number over a lower number, such as 110/72. The higher number (systolic) is the pressure inside your arteries when your heart pumps. The lower number (diastolic) is the pressure inside your arteries when your heart relaxes. Ideally you want your blood pressure below 120/80. Hypertension forces your heart to work harder to pump blood. Your arteries may become narrow or stiff. Having hypertension puts you at risk for heart disease, stroke, and other problems.  RISK FACTORS Some risk factors for high blood pressure are controllable. Others are not.  Risk factors you cannot control include:   Race. You may be at higher risk if you are African American.  Age. Risk increases with age.  Gender. Men are at higher risk than women before age 45 years. After age 65, women are at higher risk than men. Risk factors you can control include:  Not getting enough exercise or physical activity.  Being overweight.  Getting too much fat, sugar, calories, or salt in your diet.  Drinking too much alcohol. SIGNS AND SYMPTOMS Hypertension does not usually cause signs or symptoms. Extremely high blood pressure (hypertensive crisis) may cause headache, anxiety, shortness of breath, and nosebleed. DIAGNOSIS  To check if you have hypertension,  your health care provider will measure your blood pressure while you are seated, with your arm held at the level of your heart. It should be measured at least twice using the same arm. Certain conditions can cause a difference in blood pressure between your right and left arms. A blood pressure reading that is higher than normal on one occasion does not mean that you need treatment. If one blood pressure reading is high, ask your health care provider about having it checked again. TREATMENT  Treating high blood pressure includes making lifestyle changes and possibly taking medication. Living a healthy lifestyle can help lower high blood pressure. You may need to change some   of your habits. Lifestyle changes may include:  Following the DASH diet. This diet is high in fruits, vegetables, and whole grains. It is low in salt, red meat, and added sugars.  Getting at least 2 1/2 hours of brisk physical activity every week.  Losing weight if necessary.  Not smoking.  Limiting alcoholic beverages.  Learning ways to reduce stress. If lifestyle changes are not enough to get your blood pressure under control, your health care provider may prescribe medicine. You may need to take more than one. Work closely with your health care provider to understand the risks and benefits. HOME CARE INSTRUCTIONS  Have your blood pressure rechecked as directed by your health care provider.   Only take medicine as directed by your health care provider. Follow the directions carefully. Blood pressure medicines must be taken as prescribed. The medicine does not work as well when you skip doses. Skipping doses also puts you at risk for problems.   Do not smoke.   Monitor your blood pressure at home as directed by your health care provider. SEEK MEDICAL CARE IF:   You think you are having a reaction to medicines taken.  You have recurrent headaches or feel dizzy.  You have swelling in your ankles.  You have  trouble with your vision. SEEK IMMEDIATE MEDICAL CARE IF:  You develop a severe headache or confusion.  You have unusual weakness, numbness, or feel faint.  You have severe chest or abdominal pain.  You vomit repeatedly.  You have trouble breathing. MAKE SURE YOU:   Understand these instructions.  Will watch your condition.  Will get help right away if you are not doing well or get worse. Document Released: 08/02/2005 Document Revised: 08/07/2013 Document Reviewed: 05/25/2013 [employer] Patient Information 2015 [employer]. This information is not intended to replace advice given to you by your health care provider. Make sure you discuss any questions you have with your health care provider.  

## 2014-02-01 NOTE — Assessment & Plan Note (Signed)
I will recheck his A1C to see if he has developed DM2 

## 2014-02-01 NOTE — Assessment & Plan Note (Signed)
His BP is well controlled I will monitor his lytes and renal function today 

## 2014-02-01 NOTE — Progress Notes (Signed)
Pre visit review using our clinic review tool, if applicable. No additional management support is needed unless otherwise documented below in the visit note. 

## 2014-02-01 NOTE — Progress Notes (Signed)
Subjective:    Patient ID: Jack Farmer, male    DOB: [birth year], 73 y.o.   MRN: [id number]  Hypertension This is a chronic problem. The current episode started more than 1 year ago. The problem is unchanged. The problem is controlled. Pertinent negatives include no anxiety, blurred vision, chest pain, headaches, malaise/fatigue, neck pain, orthopnea, palpitations, peripheral edema, PND, shortness of breath or sweats. Past treatments include angiotensin blockers, diuretics and beta blockers. The current treatment provides moderate improvement. Compliance problems include diet and exercise.  Hypertensive end-organ damage includes CAD/MI.      Review of Systems  Constitutional: Negative.  Negative for fever, chills, malaise/fatigue, diaphoresis, appetite change and fatigue.  HENT: Negative.   Eyes: Negative.  Negative for blurred vision.  Respiratory: Negative.  Negative for apnea, cough, choking, chest tightness, shortness of breath, wheezing and stridor.   Cardiovascular: Negative.  Negative for chest pain, palpitations, orthopnea, leg swelling and PND.  Gastrointestinal: Negative.  Negative for nausea, vomiting, abdominal pain, diarrhea, constipation and blood in stool.  Endocrine: Negative.  Negative for polydipsia, polyphagia and polyuria.  Genitourinary: Negative.  Negative for dysuria, urgency, frequency, hematuria, flank pain, decreased urine volume and difficulty urinating.  Musculoskeletal: Positive for back pain. Negative for arthralgias, myalgias and neck pain.  Skin: Negative.  Negative for rash.  Allergic/Immunologic: Negative.   Neurological: Negative.  Negative for headaches.  Hematological: Negative.  Negative for adenopathy. Does not bruise/bleed easily.  Psychiatric/Behavioral: Negative.        Objective:   Physical Exam  Vitals reviewed. Constitutional: He is oriented to person, place, and time. He appears well-developed and well-nourished. No distress.  HENT:   Head: Normocephalic and atraumatic.  Mouth/Throat: Oropharynx is clear and moist. No oropharyngeal exudate.  Eyes: Conjunctivae are normal. Right eye exhibits no discharge. Left eye exhibits no discharge. No scleral icterus.  Neck: Normal range of motion. Neck supple. No JVD present. No tracheal deviation present. No thyromegaly present.  Cardiovascular: Normal rate, regular rhythm, normal heart sounds and intact distal pulses.  Exam reveals no gallop and no friction rub.   No murmur heard. Pulmonary/Chest: Effort normal and breath sounds normal. No stridor. No respiratory distress. He has no wheezes. He has no rales. He exhibits no tenderness.  Abdominal: Soft. Bowel sounds are normal. He exhibits no distension and no mass. There is no tenderness. There is no rebound and no guarding. Hernia confirmed negative in the right inguinal area and confirmed negative in the left inguinal area.  Genitourinary: Rectum normal, testes normal and penis normal. Rectal exam shows no external hemorrhoid, no internal hemorrhoid, no fissure, no mass, no tenderness and anal tone normal. Guaiac negative stool. Prostate is enlarged (1+ smooth symm BPH). Prostate is not tender. Right testis shows no mass, no swelling and no tenderness. Right testis is descended. Left testis shows no mass, no swelling and no tenderness. Left testis is descended. Circumcised. No penile erythema or penile tenderness. No discharge found.  Musculoskeletal: Normal range of motion. He exhibits no edema and no tenderness.  Lymphadenopathy:    He has no cervical adenopathy.       Right: No inguinal adenopathy present.       Left: No inguinal adenopathy present.  Neurological: He is oriented to person, place, and time.  Skin: Skin is warm and dry. No rash noted. He is not diaphoretic. No erythema. No pallor.  Psychiatric: He has a normal mood and affect. His behavior is normal. Judgment and thought content normal.  Lab Results  Component  Value Date   WBC 5.1 10/16/2012   HGB 15.8 10/16/2012   HCT 46.7 10/16/2012   PLT 170.0 10/16/2012   GLUCOSE 101* 06/19/2013   CHOL 108 10/16/2012   TRIG 215.0* 10/16/2012   HDL 25.20* 10/16/2012   LDLDIRECT 56.3 10/16/2012   LDLCALC 47 08/26/2011   ALT 30 10/16/2012   AST 22 10/16/2012   NA 135 06/19/2013   K 3.8 06/19/2013   CL 100 06/19/2013   CREATININE 1.1 06/19/2013   BUN 15 06/19/2013   CO2 30 06/19/2013   TSH 1.32 10/16/2012   PSA 0.77 10/16/2012   INR 0.90 03/31/2012   HGBA1C 6.1 06/19/2013       Assessment & Plan:

## 2014-02-01 NOTE — Assessment & Plan Note (Addendum)

## 2014-02-01 NOTE — Assessment & Plan Note (Signed)
cialis has helped with this I will recheck his PSA today

## 2014-02-01 NOTE — Assessment & Plan Note (Signed)
He is doing well on the statin I will recheck his FLP today

## 2014-04-11 ENCOUNTER — Other Ambulatory Visit: Payer: Self-pay | Admitting: *Deleted

## 2014-04-11 DIAGNOSIS — I2510 Atherosclerotic heart disease of native coronary artery without angina pectoris: Secondary | ICD-10-CM

## 2014-04-11 DIAGNOSIS — I10 Essential (primary) hypertension: Secondary | ICD-10-CM

## 2014-04-11 MED ORDER — METOPROLOL TARTRATE 50 MG PO TABS: 50.0000 mg | ORAL_TABLET | Freq: Two times a day (BID) | ORAL | Status: DC

## 2014-04-19 ENCOUNTER — Other Ambulatory Visit: Payer: Self-pay | Admitting: Internal Medicine

## 2014-05-24 ENCOUNTER — Other Ambulatory Visit: Payer: Self-pay

## 2014-05-24 DIAGNOSIS — I10 Essential (primary) hypertension: Secondary | ICD-10-CM

## 2014-05-24 MED ORDER — METOPROLOL TARTRATE 50 MG PO TABS: 50.0000 mg | ORAL_TABLET | Freq: Two times a day (BID) | ORAL | Status: DC

## 2014-07-23 ENCOUNTER — Other Ambulatory Visit: Payer: Self-pay | Admitting: Neurological Surgery

## 2014-07-23 DIAGNOSIS — M545 Low back pain: Secondary | ICD-10-CM

## 2014-07-27 ENCOUNTER — Other Ambulatory Visit: Payer: Self-pay | Admitting: Internal Medicine

## 2014-08-01 ENCOUNTER — Ambulatory Visit
Admission: RE | Admit: 2014-08-01 | Discharge: 2014-08-01 | Disposition: A | Discharge status: Home / Self Care | Payer: Medicare Other | Source: Ambulatory Visit | Attending: Neurological Surgery | Admitting: Neurological Surgery

## 2014-08-01 DIAGNOSIS — M545 Low back pain: Secondary | ICD-10-CM

## 2014-08-01 MED ORDER — IOHEXOL 180 MG/ML  SOLN
15.0000 mL | Freq: Once | INTRAMUSCULAR | Status: AC | PRN
  Administered 2014-08-01: 15 mL via INTRATHECAL

## 2014-08-01 MED ORDER — DIAZEPAM 5 MG PO TABS
5.0000 mg | ORAL_TABLET | Freq: Once | ORAL | Status: AC
  Administered 2014-08-01: 5 mg via ORAL

## 2014-08-01 NOTE — Progress Notes (Signed)
Pt states he has been off Amitriptyline for the past 2 days.   Discharge instructions explained to pt.

## 2014-08-01 NOTE — Discharge Instructions (Signed)
Myelogram Discharge Instructions  1. Go home and rest quietly for the next 24 hours.  It is important to lie flat for the next 24 hours.  Get up only to go to the restroom.  You may lie in the bed or on a couch on your back, your stomach, your left side or your right side.  You may have one pillow under your head.  You may have pillows between your knees while you are on your side or under your knees while you are on your back.  2. DO NOT drive today.  Recline the seat as far back as it will go, while still wearing your seat belt, on the way home.  3. You may get up to go to the bathroom as needed.  You may sit up for 10 minutes to eat.  You may resume your normal diet and medications unless otherwise indicated.  Drink lots of extra fluids today and tomorrow.  4. The incidence of headache, nausea, or vomiting is about 5% (one in 20 patients).  If you develop a headache, lie flat and drink plenty of fluids until the headache goes away.  Caffeinated beverages may be helpful.  If you develop severe nausea and vomiting or a headache that does not go away with flat bed rest, call [phone].  5. You may resume normal activities after your 24 hours of bed rest is over; however, do not exert yourself strongly or do any heavy lifting tomorrow. If when you get up you have a headache when standing, go back to bed and force fluids for another 24 hours.  6. Call your physician for a follow-up appointment.  The results of your myelogram will be sent directly to your physician by the following day.  7. If you have any questions or if complications develop after you arrive home, please call [phone].  Discharge instructions have been explained to the patient.  The patient, or the person responsible for the patient, fully understands these instructions.      May resume Amitriptyline on Dec. 18, 2015, after 11:00 am.

## 2014-08-20 ENCOUNTER — Other Ambulatory Visit: Payer: Self-pay | Admitting: Neurological Surgery

## 2014-08-20 DIAGNOSIS — I10 Essential (primary) hypertension: Secondary | ICD-10-CM | POA: Diagnosis not present

## 2014-08-20 DIAGNOSIS — Z6833 Body mass index (BMI) 33.0-33.9, adult: Secondary | ICD-10-CM | POA: Diagnosis not present

## 2014-08-20 DIAGNOSIS — M545 Low back pain: Secondary | ICD-10-CM | POA: Diagnosis not present

## 2014-08-20 DIAGNOSIS — L570 Actinic keratosis: Secondary | ICD-10-CM | POA: Diagnosis not present

## 2014-09-05 DIAGNOSIS — L570 Actinic keratosis: Secondary | ICD-10-CM | POA: Diagnosis not present

## 2014-09-09 ENCOUNTER — Encounter (HOSPITAL_COMMUNITY): Payer: Self-pay | Admitting: Pharmacy Technician

## 2014-09-09 NOTE — Pre-Procedure Instructions (Signed)
SAHIB PELLA  09/09/2014   Your procedure is scheduled on:  Wednesday, February 3rd  Report to [facility] Admitting at 1030 AM.  Call this number if you have problems the morning of surgery: [phone]   Remember:   Do not eat food or drink liquids after midnight.   Take these medicines the morning of surgery with A SIP OF WATER: lopressor  Stop taking aspirin, OTC vitamins/herbal medications, NSAIDS (ibuprofen, advil, motrin) 7 days prior to surgery.    Do not wear jewelry.  Do not wear lotions, powders, or perfumes,deodorant.  Do not shave 48 hours prior to surgery. Men may shave face and neck.  Do not bring valuables to the hospital.  [facility] is not responsible for any belongings or valuables.               Contacts, dentures or bridgework may not be worn into surgery.  Leave suitcase in the car. After surgery it may be brought to your room.  For patients admitted to the hospital, discharge time is determined by your treatment team.      Please read over the following fact sheets that you were given: Pain Booklet, Coughing and Deep Breathing, Blood Transfusion Information, MRSA Information and Surgical Site Infection Prevention  [place] - Preparing for Surgery  Before surgery, you can play an important role.  Because skin is not sterile, your skin needs to be as free of germs as possible.  You can reduce the number of germs on you skin by washing with CHG (chlorahexidine gluconate) soap before surgery.  CHG is an antiseptic cleaner which kills germs and bonds with the skin to continue killing germs even after washing.  Please DO NOT use if you have an allergy to CHG or antibacterial soaps.  If your skin becomes reddened/irritated stop using the CHG and inform your nurse when you arrive at Short Stay.  Do not shave (including legs and underarms) for at least 48 hours prior to the first CHG shower.  You may shave your face.  Please follow these  instructions carefully:   1.  Shower with CHG Soap the night before surgery and the morning of Surgery.  2.  If you choose to wash your hair, wash your hair first as usual with your normal shampoo.  3.  After you shampoo, rinse your hair and body thoroughly to remove the shampoo.  4.  Use CHG as you would any other liquid soap.  You can apply CHG directly to the skin and wash gently with scrungie or a clean washcloth.  5.  Apply the CHG Soap to your body ONLY FROM THE NECK DOWN.  Do not use on open wounds or open sores.  Avoid contact with your eyes, ears, mouth and genitals (private parts).  Wash genitals (private parts) with your normal soap.  6.  Wash thoroughly, paying special attention to the area where your surgery will be performed.  7.  Thoroughly rinse your body with warm water from the neck down.  8.  DO NOT shower/wash with your normal soap after using and rinsing off the CHG Soap.  9.  Pat yourself dry with a clean towel.            10.  Wear clean pajamas.            11.  Place clean sheets on your bed the night of your first shower and do not sleep with pets.  Day of Surgery  Do not apply any lotions/deoderants the morning of surgery.  Please wear clean clothes to the hospital/surgery center.

## 2014-09-10 ENCOUNTER — Encounter (HOSPITAL_COMMUNITY): Payer: Self-pay

## 2014-09-10 ENCOUNTER — Encounter (HOSPITAL_COMMUNITY)
Admission: RE | Admit: 2014-09-10 | Discharge: 2014-09-10 | Disposition: A | Discharge status: Home / Self Care | Payer: Medicare Other | Source: Ambulatory Visit | Attending: Neurological Surgery | Admitting: Neurological Surgery

## 2014-09-10 ENCOUNTER — Ambulatory Visit (HOSPITAL_COMMUNITY)
Admission: RE | Admit: 2014-09-10 | Discharge: 2014-09-10 | Disposition: A | Discharge status: Home / Self Care | Payer: Medicare Other | Source: Ambulatory Visit | Attending: Neurological Surgery | Admitting: Neurological Surgery

## 2014-09-10 DIAGNOSIS — M4806 Spinal stenosis, lumbar region: Secondary | ICD-10-CM | POA: Insufficient documentation

## 2014-09-10 DIAGNOSIS — I517 Cardiomegaly: Secondary | ICD-10-CM | POA: Diagnosis not present

## 2014-09-10 DIAGNOSIS — Z01818 Encounter for other preprocedural examination: Secondary | ICD-10-CM | POA: Insufficient documentation

## 2014-09-10 DIAGNOSIS — M48061 Spinal stenosis, lumbar region without neurogenic claudication: Secondary | ICD-10-CM

## 2014-09-10 DIAGNOSIS — I10 Essential (primary) hypertension: Secondary | ICD-10-CM | POA: Diagnosis not present

## 2014-09-10 HISTORY — DX: Unspecified malignant neoplasm of skin, unspecified: C44.90

## 2014-09-10 HISTORY — DX: Unspecified osteoarthritis, unspecified site: M19.90

## 2014-09-10 HISTORY — DX: Frequency of micturition: R35.0

## 2014-09-10 HISTORY — DX: Reserved for inherently not codable concepts without codable children: IMO0001

## 2014-09-10 LAB — BASIC METABOLIC PANEL
Anion gap: 7 (ref 5–15)
BUN: 14 mg/dL (ref 6–23)
CALCIUM: 9.4 mg/dL (ref 8.4–10.5)
CO2: 31 mmol/L (ref 19–32)
Chloride: 100 mmol/L (ref 96–112)
Creatinine, Ser: 1.29 mg/dL (ref 0.50–1.35)
GFR calc non Af Amer: 54 mL/min — ABNORMAL LOW (ref >90)
GFR, EST AFRICAN AMERICAN: 62 mL/min — AB (ref >90)
Glucose, Bld: 145 mg/dL — ABNORMAL HIGH (ref 70–99)
Potassium: 3.9 mmol/L (ref 3.5–5.1)
Sodium: 138 mmol/L (ref 135–145)

## 2014-09-10 LAB — CBC WITH DIFFERENTIAL/PLATELET
BASOS PCT: 1 % (ref 0–1)
Basophils Absolute: 0 10*3/uL (ref 0.0–0.1)
Eosinophils Absolute: 0.1 10*3/uL (ref 0.0–0.7)
Eosinophils Relative: 2 % (ref 0–5)
HCT: 44.9 % (ref 39.0–52.0)
Hemoglobin: 16.2 g/dL (ref 13.0–17.0)
Lymphocytes Relative: 35 % (ref 12–46)
Lymphs Abs: 1.8 10*3/uL (ref 0.7–4.0)
MCH: 32.1 pg (ref 26.0–34.0)
MCHC: 36.1 g/dL — ABNORMAL HIGH (ref 30.0–36.0)
MCV: 88.9 fL (ref 78.0–100.0)
MONO ABS: 0.3 10*3/uL (ref 0.1–1.0)
MONOS PCT: 6 % (ref 3–12)
NEUTROS PCT: 56 % (ref 43–77)
Neutro Abs: 3 10*3/uL (ref 1.7–7.7)
Platelets: 155 10*3/uL (ref 150–400)
RBC: 5.05 MIL/uL (ref 4.22–5.81)
RDW: 12.6 % (ref 11.5–15.5)
WBC: 5.2 10*3/uL (ref 4.0–10.5)

## 2014-09-10 LAB — TYPE AND SCREEN
ABO/RH(D): B POS
Antibody Screen: NEGATIVE

## 2014-09-10 LAB — SURGICAL PCR SCREEN
MRSA, PCR: NEGATIVE
Staphylococcus aureus: NEGATIVE

## 2014-09-10 LAB — PROTIME-INR
INR: 0.96 (ref 0.00–1.49)
PROTHROMBIN TIME: 12.8 s (ref 11.6–15.2)

## 2014-09-10 NOTE — Pre-Procedure Instructions (Signed)
Jack Farmer  09/10/2014   Your procedure is scheduled on:  Wednesday, September 18, 2014 at 12:40 PM.  Report to [facility] at 1030 AM.  Call this number if you have problems the morning of surgery: [phone]   Remember:   Do not eat food or drink liquids after midnight.   Take these medicines the morning of surgery with A SIP OF WATER: Metoprolol (Lopressor)   Stop taking aspirin, vitamins/herbal medications, NSAIDS (ibuprofen, advil, motrin) 7 days prior to surgery.   Do not wear jewelry.  Do not wear lotions, powders, or cologne.  Men may shave face and neck.  Do not bring valuables to the hospital.  [facility] is not responsible for any belongings or valuables.               Contacts, dentures or bridgework may not be worn into surgery.  Leave suitcase in the car. After surgery it may be brought to your room.  For patients admitted to the hospital, discharge time is determined by your treatment team.      Please read over the following fact sheets that you were given: Pain Booklet, Coughing and Deep Breathing, Blood Transfusion Information, MRSA Information and Surgical Site Infection Prevention   [place] - Preparing for Surgery  Before surgery, you can play an important role.  Because skin is not sterile, your skin needs to be as free of germs as possible.  You can reduce the number of germs on you skin by washing with CHG (chlorahexidine gluconate) soap before surgery.  CHG is an antiseptic cleaner which kills germs and bonds with the skin to continue killing germs even after washing.  Please DO NOT use if you have an allergy to CHG or antibacterial soaps.  If your skin becomes reddened/irritated stop using the CHG and inform your nurse when you arrive at Short Stay.  Do not shave (including legs and underarms) for at least 48 hours prior to the first CHG shower.  You may shave your face.  Please follow these instructions carefully:   1.   Shower with CHG Soap the night before surgery and the morning of Surgery.  2.  If you choose to wash your hair, wash your hair first as usual with your normal shampoo.  3.  After you shampoo, rinse your hair and body thoroughly to remove the shampoo.  4.  Use CHG as you would any other liquid soap.  You can apply CHG directly to the skin and wash gently with scrungie or a clean washcloth.  5.  Apply the CHG Soap to your body ONLY FROM THE NECK DOWN.  Do not use on open wounds or open sores.  Avoid contact with your eyes, ears, mouth and genitals (private parts).  Wash genitals (private parts) with your normal soap.  6.  Wash thoroughly, paying special attention to the area where your surgery will be performed.  7.  Thoroughly rinse your body with warm water from the neck down.  8.  DO NOT shower/wash with your normal soap after using and rinsing off the CHG Soap.  9.  Pat yourself dry with a clean towel.            10.  Wear clean pajamas.            11.  Place clean sheets on your bed the night of your first shower and do not sleep with pets.  Day of Surgery  Do not  apply any lotions/deoderants the morning of surgery.  Please wear clean clothes to the hospital/surgery center.

## 2014-09-10 NOTE — Progress Notes (Signed)
PCP is Scarlette Calico and Cardiologist is Kirk Ruths. Patient informed Nurse that he had a stress test within the last 5 years and had a cardiac cath > than 5 years ago. Patient denied having a PCI. Nurse asked patient why he had a cardiac cath and patient stated "I was at my PCP office and I told him about some chest discomfort I had been having and I had to have a cardiac workup done."  Last office visit with Dr. Stanford Breed noted in [facility] on 11/21/12. Patient also denied having any acute cardiac or pulmonary issues. Wife at chairside during PAT visit. Patient informed Nurse that he smokes 3 cigarettes a day...Marland Kitchenone for breakfast,lunch, then dinner.

## 2014-09-10 NOTE — Progress Notes (Signed)
Anesthesia Chart Review:  Pt is 74 year old male scheduled for L5-S1 laminectomy and foraminotomy, posterolateral fusion L5-S1, possible PLIF L5-S1, pedicle screws on 09/18/2014 with Dr. Ronnald Ramp.   Cardiologist is Dr. Stanford Breed. Last seen April 2014.   PMH includes: CAD, HTN, hyperlipidemia, GERD. BMI 33. Smokes 3 cigarettes per day.   Preoperative labs reviewed. Glucose 145  EKG: NSR.   Nuclear stress test 11/30/2012: Overall Impression: Normal stress nuclear study. LV Ejection Fraction: 58%. LV Wall Motion: NL LV Function; NL Wall Motion  Cardiac cath 11/29/2006: 1. Mild to moderate coronary atherosclerosis as outlined above. There is approximately 50% stenosis within the mid right coronary artery as well as an area of 40% to 50% stenosis within the circumflex. 2. Left ventricular ejection fraction is approximately 65% with no significant mitral regurgitation and a left ventricle end-diastolic pressure of 11 mmHg.  Discussed case with Dr. Conrad Otis.   If no changes, I anticipate pt can proceed with surgery as scheduled.   Willeen Cass, FNP-BC [facility] Short Stay Surgical Center/Anesthesiology Phone: [phone] 09/10/2014 3:24 PM

## 2014-09-17 ENCOUNTER — Encounter (HOSPITAL_COMMUNITY): Payer: Self-pay | Admitting: Anesthesiology

## 2014-09-17 MED ORDER — VANCOMYCIN HCL 10 G IV SOLR
1500.0000 mg | INTRAVENOUS | Status: AC
  Administered 2014-09-18: 1500 mg via INTRAVENOUS
  Filled 2014-09-17: qty 1500

## 2014-09-17 MED ORDER — DEXAMETHASONE SODIUM PHOSPHATE 10 MG/ML IJ SOLN
10.0000 mg | INTRAMUSCULAR | Status: AC
  Administered 2014-09-18: 10 mg via INTRAVENOUS
  Filled 2014-09-17: qty 1

## 2014-09-18 ENCOUNTER — Encounter (HOSPITAL_COMMUNITY): Payer: Self-pay | Admitting: *Deleted

## 2014-09-18 ENCOUNTER — Inpatient Hospital Stay (HOSPITAL_COMMUNITY): Payer: Medicare Other | Admitting: Emergency Medicine

## 2014-09-18 ENCOUNTER — Encounter (HOSPITAL_COMMUNITY)
Admission: RE | Disposition: A | Discharge status: Home / Self Care | Payer: Self-pay | Source: Ambulatory Visit | Attending: Neurological Surgery

## 2014-09-18 ENCOUNTER — Inpatient Hospital Stay (HOSPITAL_COMMUNITY): Payer: Medicare Other

## 2014-09-18 ENCOUNTER — Inpatient Hospital Stay (HOSPITAL_COMMUNITY): Payer: Medicare Other | Admitting: Anesthesiology

## 2014-09-18 ENCOUNTER — Inpatient Hospital Stay (HOSPITAL_COMMUNITY)
Admission: RE | Admit: 2014-09-18 | Discharge: 2014-09-20 | DRG: 460 | Disposition: A | Discharge status: Home / Self Care | Payer: Medicare Other | Source: Ambulatory Visit | Attending: Neurological Surgery | Admitting: Neurological Surgery

## 2014-09-18 DIAGNOSIS — M5136 Other intervertebral disc degeneration, lumbar region: Secondary | ICD-10-CM

## 2014-09-18 DIAGNOSIS — M4806 Spinal stenosis, lumbar region: Secondary | ICD-10-CM | POA: Diagnosis not present

## 2014-09-18 DIAGNOSIS — I2510 Atherosclerotic heart disease of native coronary artery without angina pectoris: Secondary | ICD-10-CM | POA: Diagnosis not present

## 2014-09-18 DIAGNOSIS — Z79899 Other long term (current) drug therapy: Secondary | ICD-10-CM

## 2014-09-18 DIAGNOSIS — M47817 Spondylosis without myelopathy or radiculopathy, lumbosacral region: Secondary | ICD-10-CM | POA: Diagnosis not present

## 2014-09-18 DIAGNOSIS — M545 Low back pain: Secondary | ICD-10-CM | POA: Diagnosis not present

## 2014-09-18 DIAGNOSIS — E785 Hyperlipidemia, unspecified: Secondary | ICD-10-CM | POA: Diagnosis present

## 2014-09-18 DIAGNOSIS — I10 Essential (primary) hypertension: Secondary | ICD-10-CM | POA: Diagnosis not present

## 2014-09-18 DIAGNOSIS — M5137 Other intervertebral disc degeneration, lumbosacral region: Principal | ICD-10-CM | POA: Diagnosis present

## 2014-09-18 DIAGNOSIS — Z981 Arthrodesis status: Secondary | ICD-10-CM | POA: Diagnosis not present

## 2014-09-18 DIAGNOSIS — Z85828 Personal history of other malignant neoplasm of skin: Secondary | ICD-10-CM | POA: Diagnosis not present

## 2014-09-18 DIAGNOSIS — Z7982 Long term (current) use of aspirin: Secondary | ICD-10-CM | POA: Diagnosis not present

## 2014-09-18 DIAGNOSIS — F17210 Nicotine dependence, cigarettes, uncomplicated: Secondary | ICD-10-CM | POA: Diagnosis not present

## 2014-09-18 DIAGNOSIS — M4807 Spinal stenosis, lumbosacral region: Secondary | ICD-10-CM | POA: Diagnosis not present

## 2014-09-18 DIAGNOSIS — R0602 Shortness of breath: Secondary | ICD-10-CM | POA: Diagnosis not present

## 2014-09-18 DIAGNOSIS — Z4889 Encounter for other specified surgical aftercare: Secondary | ICD-10-CM | POA: Diagnosis not present

## 2014-09-18 SURGERY — POSTERIOR LUMBAR FUSION 1 LEVEL
Anesthesia: General | Site: Back

## 2014-09-18 MED ORDER — SENNA 8.6 MG PO TABS
1.0000 | ORAL_TABLET | Freq: Every day | ORAL | Status: DC
  Administered 2014-09-18 – 2014-09-19 (×2): 8.6 mg via ORAL
  Filled 2014-09-18 (×4): qty 1

## 2014-09-18 MED ORDER — SODIUM CHLORIDE 0.9 % IR SOLN
Status: DC | PRN
  Administered 2014-09-18: 11:00:00

## 2014-09-18 MED ORDER — FENTANYL CITRATE 0.05 MG/ML IJ SOLN
INTRAMUSCULAR | Status: AC
  Filled 2014-09-18: qty 5

## 2014-09-18 MED ORDER — BUPIVACAINE HCL (PF) 0.25 % IJ SOLN
INTRAMUSCULAR | Status: DC | PRN
  Administered 2014-09-18: 2 mL

## 2014-09-18 MED ORDER — OXYCODONE HCL 5 MG PO TABS: 5.0000 mg | ORAL_TABLET | Freq: Once | ORAL | Status: DC | PRN

## 2014-09-18 MED ORDER — THROMBIN 20000 UNITS EX SOLR
CUTANEOUS | Status: DC | PRN
  Administered 2014-09-18: 11:00:00 via TOPICAL

## 2014-09-18 MED ORDER — PROPOFOL INFUSION 10 MG/ML OPTIME
INTRAVENOUS | Status: DC | PRN
  Administered 2014-09-18: 50 ug/kg/min via INTRAVENOUS

## 2014-09-18 MED ORDER — ASPIRIN EC 81 MG PO TBEC
81.0000 mg | DELAYED_RELEASE_TABLET | Freq: Every day | ORAL | Status: DC
  Administered 2014-09-18 – 2014-09-19 (×2): 81 mg via ORAL
  Filled 2014-09-18 (×3): qty 1

## 2014-09-18 MED ORDER — VANCOMYCIN HCL IN DEXTROSE 750-5 MG/150ML-% IV SOLN
750.0000 mg | Freq: Two times a day (BID) | INTRAVENOUS | Status: DC
  Administered 2014-09-18: 750 mg via INTRAVENOUS
  Filled 2014-09-18 (×5): qty 150

## 2014-09-18 MED ORDER — PHENYLEPHRINE HCL 10 MG/ML IJ SOLN
10.0000 mg | INTRAVENOUS | Status: DC | PRN
  Administered 2014-09-18: 25 ug/min via INTRAVENOUS

## 2014-09-18 MED ORDER — GLYCOPYRROLATE 0.2 MG/ML IJ SOLN
INTRAMUSCULAR | Status: DC | PRN
  Administered 2014-09-18: .7 mg via INTRAVENOUS

## 2014-09-18 MED ORDER — PROPOFOL 10 MG/ML IV BOLUS
INTRAVENOUS | Status: AC
  Filled 2014-09-18: qty 20

## 2014-09-18 MED ORDER — SODIUM CHLORIDE 0.9 % IJ SOLN
3.0000 mL | Freq: Two times a day (BID) | INTRAMUSCULAR | Status: DC
  Administered 2014-09-18: 3 mL via INTRAVENOUS

## 2014-09-18 MED ORDER — METHOCARBAMOL 500 MG PO TABS
500.0000 mg | ORAL_TABLET | Freq: Four times a day (QID) | ORAL | Status: DC | PRN
  Administered 2014-09-18 – 2014-09-20 (×5): 500 mg via ORAL
  Filled 2014-09-18 (×4): qty 1

## 2014-09-18 MED ORDER — PHENOL 1.4 % MT LIQD: 1.0000 | OROMUCOSAL | Status: DC | PRN

## 2014-09-18 MED ORDER — FENTANYL CITRATE 0.05 MG/ML IJ SOLN
INTRAMUSCULAR | Status: DC | PRN
  Administered 2014-09-18: 50 ug via INTRAVENOUS
  Administered 2014-09-18: 100 ug via INTRAVENOUS
  Administered 2014-09-18: 50 ug via INTRAVENOUS
  Administered 2014-09-18: 150 ug via INTRAVENOUS

## 2014-09-18 MED ORDER — OXYCODONE-ACETAMINOPHEN 5-325 MG PO TABS
ORAL_TABLET | ORAL | Status: AC
  Administered 2014-09-18: 2 via ORAL
  Filled 2014-09-18: qty 2

## 2014-09-18 MED ORDER — VECURONIUM BROMIDE 10 MG IV SOLR
INTRAVENOUS | Status: DC | PRN
  Administered 2014-09-18: 4 mg via INTRAVENOUS

## 2014-09-18 MED ORDER — METHOCARBAMOL 1000 MG/10ML IJ SOLN
500.0000 mg | Freq: Four times a day (QID) | INTRAVENOUS | Status: DC | PRN
  Filled 2014-09-18: qty 5

## 2014-09-18 MED ORDER — POTASSIUM CHLORIDE IN NACL 20-0.9 MEQ/L-% IV SOLN
INTRAVENOUS | Status: DC
  Filled 2014-09-18 (×5): qty 1000

## 2014-09-18 MED ORDER — METOPROLOL TARTRATE 50 MG PO TABS
50.0000 mg | ORAL_TABLET | Freq: Two times a day (BID) | ORAL | Status: DC
  Administered 2014-09-18 – 2014-09-19 (×3): 50 mg via ORAL
  Filled 2014-09-18 (×5): qty 1

## 2014-09-18 MED ORDER — HYDROCHLOROTHIAZIDE 12.5 MG PO CAPS
12.5000 mg | ORAL_CAPSULE | Freq: Every day | ORAL | Status: DC
  Administered 2014-09-18 – 2014-09-19 (×2): 12.5 mg via ORAL
  Filled 2014-09-18 (×3): qty 1

## 2014-09-18 MED ORDER — PROPOFOL 10 MG/ML IV BOLUS
INTRAVENOUS | Status: DC | PRN
  Administered 2014-09-18: 200 mg via INTRAVENOUS

## 2014-09-18 MED ORDER — DOCUSATE SODIUM 100 MG PO CAPS
100.0000 mg | ORAL_CAPSULE | Freq: Two times a day (BID) | ORAL | Status: DC
  Administered 2014-09-18 – 2014-09-19 (×3): 100 mg via ORAL
  Filled 2014-09-18 (×3): qty 1

## 2014-09-18 MED ORDER — 0.9 % SODIUM CHLORIDE (POUR BTL) OPTIME
TOPICAL | Status: DC | PRN
  Administered 2014-09-18: 1000 mL

## 2014-09-18 MED ORDER — LOSARTAN POTASSIUM-HCTZ 100-12.5 MG PO TABS: 1.0000 | ORAL_TABLET | Freq: Every day | ORAL | Status: DC

## 2014-09-18 MED ORDER — ONDANSETRON HCL 4 MG/2ML IJ SOLN: 4.0000 mg | INTRAMUSCULAR | Status: DC | PRN

## 2014-09-18 MED ORDER — SUCCINYLCHOLINE CHLORIDE 20 MG/ML IJ SOLN
INTRAMUSCULAR | Status: DC | PRN
  Administered 2014-09-18: 120 mg via INTRAVENOUS

## 2014-09-18 MED ORDER — AMITRIPTYLINE HCL 25 MG PO TABS
25.0000 mg | ORAL_TABLET | Freq: Every day | ORAL | Status: DC
  Administered 2014-09-18 – 2014-09-19 (×2): 25 mg via ORAL
  Filled 2014-09-18 (×3): qty 1

## 2014-09-18 MED ORDER — THROMBIN 5000 UNITS EX SOLR
OROMUCOSAL | Status: DC | PRN
  Administered 2014-09-18: 11:00:00 via TOPICAL

## 2014-09-18 MED ORDER — MORPHINE SULFATE 2 MG/ML IJ SOLN: 1.0000 mg | INTRAMUSCULAR | Status: DC | PRN

## 2014-09-18 MED ORDER — HYDROMORPHONE HCL 1 MG/ML IJ SOLN
0.2500 mg | INTRAMUSCULAR | Status: DC | PRN
  Administered 2014-09-18 (×3): 0.5 mg via INTRAVENOUS

## 2014-09-18 MED ORDER — ONDANSETRON HCL 4 MG/2ML IJ SOLN
INTRAMUSCULAR | Status: DC | PRN
  Administered 2014-09-18: 4 mg via INTRAVENOUS

## 2014-09-18 MED ORDER — ACETAMINOPHEN 650 MG RE SUPP: 650.0000 mg | RECTAL | Status: DC | PRN

## 2014-09-18 MED ORDER — NEOSTIGMINE METHYLSULFATE 10 MG/10ML IV SOLN
INTRAVENOUS | Status: DC | PRN
Start: 2014-09-18 — End: 2014-09-18
  Administered 2014-09-18: 4 mg via INTRAVENOUS

## 2014-09-18 MED ORDER — MENTHOL 3 MG MT LOZG: 1.0000 | LOZENGE | OROMUCOSAL | Status: DC | PRN

## 2014-09-18 MED ORDER — SODIUM CHLORIDE 0.9 % IJ SOLN: 3.0000 mL | INTRAMUSCULAR | Status: DC | PRN

## 2014-09-18 MED ORDER — ACETAMINOPHEN 325 MG PO TABS: 650.0000 mg | ORAL_TABLET | ORAL | Status: DC | PRN

## 2014-09-18 MED ORDER — ONDANSETRON HCL 4 MG/2ML IJ SOLN: 4.0000 mg | Freq: Four times a day (QID) | INTRAMUSCULAR | Status: DC | PRN

## 2014-09-18 MED ORDER — LACTATED RINGERS IV SOLN
INTRAVENOUS | Status: DC
  Administered 2014-09-18: 08:00:00 via INTRAVENOUS

## 2014-09-18 MED ORDER — MIDAZOLAM HCL 2 MG/2ML IJ SOLN
INTRAMUSCULAR | Status: AC
  Filled 2014-09-18: qty 2

## 2014-09-18 MED ORDER — LIDOCAINE HCL (CARDIAC) 20 MG/ML IV SOLN
INTRAVENOUS | Status: DC | PRN
  Administered 2014-09-18: 80 mg via INTRAVENOUS

## 2014-09-18 MED ORDER — LOSARTAN POTASSIUM 50 MG PO TABS
100.0000 mg | ORAL_TABLET | Freq: Every day | ORAL | Status: DC
  Administered 2014-09-18 – 2014-09-19 (×2): 100 mg via ORAL
  Filled 2014-09-18 (×3): qty 2

## 2014-09-18 MED ORDER — LACTATED RINGERS IV SOLN
INTRAVENOUS | Status: DC | PRN
  Administered 2014-09-18 (×3): via INTRAVENOUS

## 2014-09-18 MED ORDER — PHENYLEPHRINE HCL 10 MG/ML IJ SOLN
INTRAMUSCULAR | Status: DC | PRN
  Administered 2014-09-18 (×2): 80 ug via INTRAVENOUS

## 2014-09-18 MED ORDER — METHOCARBAMOL 500 MG PO TABS
ORAL_TABLET | ORAL | Status: AC
  Administered 2014-09-18: 500 mg via ORAL
  Filled 2014-09-18: qty 1

## 2014-09-18 MED ORDER — HYDROMORPHONE HCL 1 MG/ML IJ SOLN
INTRAMUSCULAR | Status: AC
  Administered 2014-09-18: 0.5 mg via INTRAVENOUS
  Filled 2014-09-18: qty 1

## 2014-09-18 MED ORDER — OXYCODONE-ACETAMINOPHEN 5-325 MG PO TABS
1.0000 | ORAL_TABLET | ORAL | Status: DC | PRN
  Administered 2014-09-18 – 2014-09-20 (×5): 2 via ORAL
  Filled 2014-09-18 (×4): qty 2

## 2014-09-18 MED ORDER — OXYCODONE HCL 5 MG/5ML PO SOLN: 5.0000 mg | Freq: Once | ORAL | Status: DC | PRN

## 2014-09-18 SURGICAL SUPPLY — 74 items
ALLOSTEM STRIP 20MMX50MM (Tissue) ×2 IMPLANT
APL SKNCLS STERI-STRIP NONHPOA (GAUZE/BANDAGES/DRESSINGS) ×1
BAG DECANTER FOR FLEXI CONT (MISCELLANEOUS) ×3 IMPLANT
BENZOIN TINCTURE PRP APPL 2/3 (GAUZE/BANDAGES/DRESSINGS) ×3 IMPLANT
BLADE 15 SAFETY STRL DISP (BLADE) ×2 IMPLANT
BLADE BN FN 3.2XSTRL LF (MISCELLANEOUS) IMPLANT
BLADE BONE MILL FINE (MISCELLANEOUS) ×3
BLADE CLIPPER SURG (BLADE) IMPLANT
BUR MATCHSTICK NEURO 3.0 LAGG (BURR) ×3 IMPLANT
CANISTER SUCT 3000ML (MISCELLANEOUS) ×3 IMPLANT
CLIP NEUROVISION LG (CLIP) ×2 IMPLANT
CLOSURE WOUND 1/2 X4 (GAUZE/BANDAGES/DRESSINGS) ×1
CONT SPEC 4OZ CLIKSEAL STRL BL (MISCELLANEOUS) ×6 IMPLANT
COVER BACK TABLE 60X90IN (DRAPES) ×3 IMPLANT
DRAPE C-ARM 42X72 X-RAY (DRAPES) ×4 IMPLANT
DRAPE C-ARMOR (DRAPES) ×2 IMPLANT
DRAPE LAPAROTOMY 100X72X124 (DRAPES) ×3 IMPLANT
DRAPE POUCH INSTRU U-SHP 10X18 (DRAPES) ×3 IMPLANT
DRAPE SURG 17X23 STRL (DRAPES) ×3 IMPLANT
DRSG OPSITE 4X5.5 SM (GAUZE/BANDAGES/DRESSINGS) ×4 IMPLANT
DRSG OPSITE POSTOP 4X6 (GAUZE/BANDAGES/DRESSINGS) ×2 IMPLANT
DRSG TELFA 3X8 NADH (GAUZE/BANDAGES/DRESSINGS) IMPLANT
DURAPREP 26ML APPLICATOR (WOUND CARE) ×3 IMPLANT
ELECT BLADE 4.0 EZ CLEAN MEGAD (MISCELLANEOUS) ×3
ELECT REM PT RETURN 9FT ADLT (ELECTROSURGICAL) ×3
ELECTRODE BLDE 4.0 EZ CLN MEGD (MISCELLANEOUS) IMPLANT
ELECTRODE REM PT RTRN 9FT ADLT (ELECTROSURGICAL) ×1 IMPLANT
EVACUATOR 1/8 PVC DRAIN (DRAIN) ×3 IMPLANT
GAUZE SPONGE 4X4 16PLY XRAY LF (GAUZE/BANDAGES/DRESSINGS) IMPLANT
GLOVE BIO SURGEON STRL SZ7 (GLOVE) ×4 IMPLANT
GLOVE BIO SURGEON STRL SZ8 (GLOVE) ×6 IMPLANT
GLOVE BIOGEL PI IND STRL 7.5 (GLOVE) IMPLANT
GLOVE BIOGEL PI INDICATOR 7.5 (GLOVE) ×4
GLOVE ECLIPSE 6.5 STRL STRAW (GLOVE) ×2 IMPLANT
GLOVE ECLIPSE 7.5 STRL STRAW (GLOVE) ×2 IMPLANT
GLOVE SURG SS PI 7.0 STRL IVOR (GLOVE) ×2 IMPLANT
GOWN STRL REUS W/ TWL LRG LVL3 (GOWN DISPOSABLE) IMPLANT
GOWN STRL REUS W/ TWL XL LVL3 (GOWN DISPOSABLE) ×2 IMPLANT
GOWN STRL REUS W/TWL 2XL LVL3 (GOWN DISPOSABLE) IMPLANT
GOWN STRL REUS W/TWL LRG LVL3 (GOWN DISPOSABLE) ×3
GOWN STRL REUS W/TWL XL LVL3 (GOWN DISPOSABLE) ×12
HEMOSTAT POWDER KIT SURGIFOAM (HEMOSTASIS) ×2 IMPLANT
KIT BASIN OR (CUSTOM PROCEDURE TRAY) ×3 IMPLANT
KIT NDL NVM5 EMG ELECT (KITS) IMPLANT
KIT NEEDLE NVM5 EMG ELECT (KITS) ×1 IMPLANT
KIT NEEDLE NVM5 EMG ELECTRODE (KITS) ×2
KIT ROOM TURNOVER OR (KITS) ×3 IMPLANT
NDL HYPO 25X1 1.5 SAFETY (NEEDLE) ×1 IMPLANT
NEEDLE HYPO 25X1 1.5 SAFETY (NEEDLE) ×3 IMPLANT
NS IRRIG 1000ML POUR BTL (IV SOLUTION) ×3 IMPLANT
PACK LAMINECTOMY NEURO (CUSTOM PROCEDURE TRAY) ×3 IMPLANT
PAD ARMBOARD 7.5X6 YLW CONV (MISCELLANEOUS) ×9 IMPLANT
PAD DRESSING TELFA 3X8 NADH (GAUZE/BANDAGES/DRESSINGS) ×1 IMPLANT
ROD 30MM (Rod) ×4 IMPLANT
ROD PLIF MAS PB SPHERX 30 (Rod) IMPLANT
SCREW LOCK (Screw) ×12 IMPLANT
SCREW LOCK FXNS SPNE MAS PL (Screw) IMPLANT
SCREW SHANK 5.0X30MM (Screw) ×2 IMPLANT
SCREW SHANK 5.0X35 (Screw) ×2 IMPLANT
SCREW SHANK 6.5X65 (Screw) ×4 IMPLANT
SCREW TULIP 5.5 (Screw) ×8 IMPLANT
SPONGE LAP 4X18 X RAY DECT (DISPOSABLE) IMPLANT
SPONGE SURGIFOAM ABS GEL 100 (HEMOSTASIS) ×3 IMPLANT
STRIP CLOSURE SKIN 1/2X4 (GAUZE/BANDAGES/DRESSINGS) ×3 IMPLANT
SUT VIC AB 0 CT1 18XCR BRD8 (SUTURE) ×1 IMPLANT
SUT VIC AB 0 CT1 8-18 (SUTURE) ×3
SUT VIC AB 2-0 CP2 18 (SUTURE) ×3 IMPLANT
SUT VIC AB 3-0 SH 8-18 (SUTURE) ×6 IMPLANT
SYR 20ML ECCENTRIC (SYRINGE) ×3 IMPLANT
TOWEL OR 17X24 6PK STRL BLUE (TOWEL DISPOSABLE) ×3 IMPLANT
TOWEL OR 17X26 10 PK STRL BLUE (TOWEL DISPOSABLE) ×3 IMPLANT
TRAP SPECIMEN MUCOUS 40CC (MISCELLANEOUS) ×2 IMPLANT
TRAY FOLEY CATH 16FRSI W/METER (SET/KITS/TRAYS/PACK) ×2 IMPLANT
WATER STERILE IRR 1000ML POUR (IV SOLUTION) ×3 IMPLANT

## 2014-09-18 NOTE — Evaluation (Signed)
Physical Therapy Evaluation Patient Details Name: Jack Farmer MRN: [id number] DOB: 12-28-1940 Today's Date: 09/18/2014   History of Present Illness   pt admitted for a lumbar decompression and PLIF at L5 S1  Clinical Impression  Pt admitted with/for lumbar fusion surgery.  Pt currently limited functionally due to the problems listed below.  (see problems list.)  Pt will benefit from PT to maximize function and safety to be able to get home safely with available assist family.     Follow Up Recommendations No PT follow up    Equipment Recommendations  None recommended by PT    Recommendations for Other Services       Precautions / Restrictions Precautions Precautions: Back Required Braces or Orthoses: Spinal Brace Spinal Brace: Applied in sitting position      Mobility  Bed Mobility Overal bed mobility: Needs Assistance Bed Mobility: Sidelying to Sit;Rolling;Sit to Sidelying Rolling: Min guard Sidelying to sit: Min guard     Sit to sidelying: Min guard General bed mobility comments: reinforced safe technque  Transfers Overall transfer level: Needs assistance   Transfers: Sit to/from Stand Sit to Stand: Supervision            Ambulation/Gait Ambulation/Gait assistance: Supervision Ambulation Distance (Feet): 250 Feet Assistive device: Rolling walker (2 wheeled) Gait Pattern/deviations: Step-through pattern;Trunk flexed Gait velocity: slow   General Gait Details: slow and guarded  Stairs            Wheelchair Mobility    Modified Rankin (Stroke Patients Only)       Balance Overall balance assessment: No apparent balance deficits (not formally assessed)                                           Pertinent Vitals/Pain Pain Assessment: 0-10 Pain Score: 6  Pain Location: back Pain Descriptors / Indicators: Aching Pain Intervention(s): Premedicated before session    Home Living Family/patient expects to be discharged  to:: Private residence Living Arrangements: Spouse/significant other Available Help at Discharge: Family Type of Home: House Home Access: Stairs to enter Entrance Stairs-Rails: Psychiatric nurse of Steps: 2 Home Layout: One level Home Equipment: Environmental consultant - 2 wheels;Bedside commode;Shower seat      Prior Function Level of Independence: Independent               Hand Dominance        Extremity/Trunk Assessment               Lower Extremity Assessment: Generalized weakness         Communication   Communication: No difficulties  Cognition Arousal/Alertness: Awake/alert   Overall Cognitive Status: Within Functional Limits for tasks assessed                      General Comments General comments (skin integrity, edema, etc.): Educated/reinforced back care/prec, log roll, lifting restrictions, bracing issues, progression of activity.    Exercises        Assessment/Plan    PT Assessment Patient needs continued PT services  PT Diagnosis Acute pain;Generalized weakness   PT Problem List Decreased strength;Decreased activity tolerance;Decreased mobility;Decreased knowledge of use of DME;Decreased knowledge of precautions;Pain  PT Treatment Interventions Stair training;Gait training;DME instruction;Functional mobility training;Therapeutic activities;Patient/family education   PT Goals (Current goals can be found in the Care Plan section) Acute Rehab PT Goals Patient Stated Goal: home/independent PT  Goal Formulation: With patient Time For Goal Achievement: 09/25/14 Potential to Achieve Goals: Good    Frequency Min 5X/week   Barriers to discharge        Co-evaluation               End of Session   Activity Tolerance: Patient tolerated treatment well Patient left: in bed;with call bell/phone within reach;with family/visitor present Nurse Communication: Mobility status    Functional Assessment Tool Used: clinical  judgement Functional Limitation: Mobility: Walking and moving around Mobility: Walking and Moving Around Current Status (O2774): At least 1 percent but less than 20 percent impaired, limited or restricted Mobility: Walking and Moving Around Goal Status [phone]): At least 1 percent but less than 20 percent impaired, limited or restricted    Time: 1644-1710 PT Time Calculation (min) (ACUTE ONLY): 26 min   Charges:   PT Evaluation $Initial PT Evaluation Tier I: 1 Procedure PT Treatments $Gait Training: 8-22 mins   PT G Codes:   PT G-Codes **NOT FOR INPATIENT CLASS** Functional Assessment Tool Used: clinical judgement Functional Limitation: Mobility: Walking and moving around Mobility: Walking and Moving Around Current Status (M7672): At least 1 percent but less than 20 percent impaired, limited or restricted Mobility: Walking and Moving Around Goal Status [phone]): At least 1 percent but less than 20 percent impaired, limited or restricted    Carlisha Wisler, Tessie Fass 09/18/2014, 5:31 PM 09/18/2014  Donnella Sham, PT [phone]  (pager)

## 2014-09-18 NOTE — Anesthesia Postprocedure Evaluation (Signed)
Anesthesia Post Note  Patient: Jack Farmer  Procedure(s) Performed: Procedure(s) (LRB): LUMBAR FIVE-SACRAL ONE Laminectomy and Foraminotomy and Posterolateral fusion    (N/A)  Anesthesia type: General  Patient location: PACU  Post pain: Pain level controlled and Adequate analgesia  Post assessment: Post-op Vital signs reviewed, Patient's Cardiovascular Status Stable, Respiratory Function Stable, Patent Airway and Pain level controlled  Last Vitals:  Filed Vitals:   09/18/14 1430  BP: 130/45  Pulse: 87  Temp:   Resp: 14    Post vital signs: Reviewed and stable  Level of consciousness: awake, alert  and oriented  Complications: No apparent anesthesia complications

## 2014-09-18 NOTE — Anesthesia Procedure Notes (Signed)
Procedure Name: Intubation Date/Time: 09/18/2014 9:53 AM Performed by: Eligha Bridegroom Pre-anesthesia Checklist: Patient identified, Emergency Drugs available, Timeout performed, Suction available and Patient being monitored Patient Re-evaluated:Patient Re-evaluated prior to inductionOxygen Delivery Method: Circle system utilized Preoxygenation: Pre-oxygenation with 100% oxygen Intubation Type: IV induction Grade View: Grade III Tube type: Oral Tube size: 8.0 mm Number of attempts: 1 Airway Equipment and Method: Stylet and Video-laryngoscopy Placement Confirmation: ETT inserted through vocal cords under direct vision,  breath sounds checked- equal and bilateral and positive ETCO2 Secured at: 22 cm Tube secured with: Tape Dental Injury: Teeth and Oropharynx as per pre-operative assessment  Difficulty Due To: Difficulty was anticipated, Difficult Airway- due to large tongue, Difficult Airway- due to reduced neck mobility and Difficult Airway- due to limited oral opening Future Recommendations: Recommend- induction with short-acting agent, and alternative techniques readily available

## 2014-09-18 NOTE — Progress Notes (Signed)
ANTIBIOTIC CONSULT NOTE - INITIAL  Pharmacy Consult for Vancomycin Indication: surgical prophylaxis  Allergies  Allergen Reactions  . Cephalexin Anaphylaxis  . Indocin [Indomethacin] Other (See Comments)     Gi bleed    Patient Measurements: Height: 6\' 2"  (188 cm) Weight: 260 lb (117.935 kg) IBW/kg (Calculated) : 82.2  Vital Signs: Temp: 98.3 F (36.8 C) (02/03 1540) Temp Source: Oral (02/03 0753) BP: 145/71 mmHg (02/03 1540) Pulse Rate: 82 (02/03 1540) Intake/Output from previous day:   Intake/Output from this shift: Total I/O In: 2200 [I.V.:2200] Out: 380 [Urine:130; Blood:250]  Labs: No results for input(s): WBC, HGB, PLT, LABCREA, CREATININE in the last 72 hours. Estimated Creatinine Clearance: 69.6 mL/min (by C-G formula based on Cr of 1.29). No results for input(s): VANCOTROUGH, VANCOPEAK, VANCORANDOM, GENTTROUGH, GENTPEAK, GENTRANDOM, TOBRATROUGH, TOBRAPEAK, TOBRARND, AMIKACINPEAK, AMIKACINTROU, AMIKACIN in the last 72 hours.   Microbiology: Recent Results (from the past 720 hour(s))  Surgical pcr screen     Status: None   Collection Time: 09/10/14 10:29 AM  Result Value Ref Range Status   MRSA, PCR NEGATIVE NEGATIVE Final   Staphylococcus aureus NEGATIVE NEGATIVE Final    Comment:        The Xpert SA Assay (FDA approved for NASAL specimens in patients over 40 years of age), is one component of a comprehensive surveillance program.  Test performance has been validated by [facility] for patients greater than or equal to 36 year old. It is not intended to diagnose infection nor to guide or monitor treatment.     Medical History: Past Medical History  Diagnosis Date  . Hypertension   . Hyperlipidemia   . Low HDL (under 40)   . CAD (coronary artery disease)   . Hypertrophy of prostate with urinary obstruction and other lower urinary tract symptoms (LUTS)   . Low back pain   . GERD (gastroesophageal reflux disease)   . Shortness of breath dyspnea      with ambulation  . Frequent urination   . Arthritis   . Skin cancer     removed from face    Medications:  Prescriptions prior to admission  Medication Sig Dispense Refill Last Dose  . amitriptyline (ELAVIL) 25 MG tablet Take 25 mg by mouth at bedtime.   09/17/2014 at 2200  . Ascorbic Acid (VITAMIN C) 1000 MG tablet Take 1,000 mg by mouth daily.   Past Week at Unknown time  . aspirin EC 81 MG tablet Take 81 mg by mouth daily.   Past Week at Unknown time  . b complex vitamins tablet Take 1 tablet by mouth daily.   Past Week at Unknown time  . calcium carbonate (OS-CAL) 600 MG TABS Take 600 mg by mouth daily.   Past Week at Unknown time  . Coenzyme Q10 (CO Q-10) 200 MG CAPS Take 200 mg by mouth daily.    Past Week at Unknown time  . losartan-hydrochlorothiazide (HYZAAR) 100-12.5 MG per tablet TAKE 1 TABLET BY MOUTH EVERY DAY (Patient taking differently: Take 1 tablet by mouth every day) 90 tablet 1 09/17/2014 at Unknown time  . metoprolol (LOPRESSOR) 50 MG tablet Take 1 tablet (50 mg total) by mouth 2 (two) times daily. 180 tablet 3 09/18/2014 at 0615  . Omega-3 Fatty Acids (FISH OIL) 1000 MG CAPS Take 1,000 mg by mouth daily.   Past Week at Unknown time  . simvastatin (ZOCOR) 40 MG tablet TAKE 1 TABLET (40 MG TOTAL) BY MOUTH EVERY EVENING. (Patient taking differently: Take 40  mg by mouth daily) 90 tablet 3 09/17/2014 at 2200  . tiZANidine (ZANAFLEX) 4 MG tablet Take 4 mg by mouth daily.    09/17/2014 at 2200  . vitamin E 400 UNIT capsule Take 400 Units by mouth daily.   Past Week at Unknown time  . tadalafil (CIALIS) 20 MG tablet Take 1 tablet (20 mg total) by mouth daily as needed for erectile dysfunction. (Patient not taking: Reported on 09/09/2014) 6 tablet 11 Taking   Assessment: 74 y.o. male presents for decompressive lumbar laminectomy. Pt received 1500mg  IV pre-op at 0930. Pt with hemovac drain so vancomycin to continue post-op until MD discontinues. SCr 1.29, est CrCl 70 ml/min.   Goal of  Therapy:  Vancomycin trough level 10-15 mcg/ml  Plan:  1. Vancomycin 750mg  IV q12h 2. Will f/u renal function and drain removal 3. Likely will not need level unless continues >5 days  Sherlon Handing, PharmD, BCPS Clinical pharmacist, pager [phone] 09/18/2014,4:05 PM

## 2014-09-18 NOTE — Transfer of Care (Signed)
Immediate Anesthesia Transfer of Care Note  Patient: Jack Farmer  Procedure(s) Performed: Procedure(s): LUMBAR FIVE-SACRAL ONE Laminectomy and Foraminotomy and Posterolateral fusion    (N/A)  Patient Location: PACU  Anesthesia Type:General  Level of Consciousness: awake and alert   Airway & Oxygen Therapy: Patient Spontanous Breathing and Patient connected to face mask oxygen  Post-op Assessment: Report given to RN and Post -op Vital signs reviewed and stable  Post vital signs: Reviewed and stable  Last Vitals:  Filed Vitals:   09/18/14 0753  BP: 160/72  Pulse: 83  Temp: 36.4 C  Resp: 20    Complications: No apparent anesthesia complications

## 2014-09-18 NOTE — Anesthesia Preprocedure Evaluation (Deleted)
Anesthesia Evaluation  Patient identified by MRN, date of birth, ID band Patient awake    Reviewed: Allergy & Precautions, NPO status , Patient's Chart, lab work & pertinent test results  Airway Mallampati: II   Neck ROM: full    Dental   Pulmonary Current Smoker,          Cardiovascular hypertension, + CAD and + Peripheral Vascular Disease  Mild CAD on cath (2008).  Normal stress test (2014).   Neuro/Psych    GI/Hepatic GERD-  ,  Endo/Other  obese  Renal/GU      Musculoskeletal  (+) Arthritis -,   Abdominal   Peds  Hematology   Anesthesia Other Findings   Reproductive/Obstetrics                             Anesthesia Physical Anesthesia Plan  ASA: III  Anesthesia Plan: General   Post-op Pain Management:    Induction: Intravenous  Airway Management Planned: Oral ETT  Additional Equipment:   Intra-op Plan:   Post-operative Plan: Extubation in OR  Informed Consent: I have reviewed the patients History and Physical, chart, labs and discussed the procedure including the risks, benefits and alternatives for the proposed anesthesia with the patient or authorized representative who has indicated his/her understanding and acceptance.     Plan Discussed with: CRNA, Anesthesiologist and Surgeon  Anesthesia Plan Comments:         Anesthesia Quick Evaluation

## 2014-09-18 NOTE — Anesthesia Preprocedure Evaluation (Signed)
Anesthesia Evaluation  Patient identified by MRN, date of birth, ID band Patient awake    Reviewed: Allergy & Precautions, NPO status , Patient's Chart, lab work & pertinent test results  Airway Mallampati: II   Neck ROM: full    Dental   Pulmonary Current Smoker,          Cardiovascular hypertension, + CAD and + Peripheral Vascular Disease  Mild CAD on cath (2008).  Negative stress test (2014).   Neuro/Psych    GI/Hepatic GERD-  ,  Endo/Other  obese  Renal/GU      Musculoskeletal  (+) Arthritis -,   Abdominal   Peds  Hematology   Anesthesia Other Findings   Reproductive/Obstetrics                             Anesthesia Physical Anesthesia Plan  ASA: III  Anesthesia Plan: General   Post-op Pain Management:    Induction: Intravenous  Airway Management Planned: Oral ETT  Additional Equipment:   Intra-op Plan:   Post-operative Plan: Extubation in OR  Informed Consent: I have reviewed the patients History and Physical, chart, labs and discussed the procedure including the risks, benefits and alternatives for the proposed anesthesia with the patient or authorized representative who has indicated his/her understanding and acceptance.     Plan Discussed with: CRNA, Anesthesiologist and Surgeon  Anesthesia Plan Comments:         Anesthesia Quick Evaluation

## 2014-09-18 NOTE — H&P (Signed)
Subjective: Patient is a 74 y.o. male admitted for L5-S1 fusion. Onset of symptoms was several months ago, gradually worsening since that time.  The pain is rated severe, and is located at the across the lower back and radiates to hips. The pain is described as aching and occurs all day. The symptoms have been progressive. Symptoms are exacerbated by exercise. MRI or CT showed DDD L5-S1.   Past Medical History  Diagnosis Date  . Hypertension   . Hyperlipidemia   . Low HDL (under 40)   . CAD (coronary artery disease)   . Hypertrophy of prostate with urinary obstruction and other lower urinary tract symptoms (LUTS)   . Low back pain   . GERD (gastroesophageal reflux disease)   . Shortness of breath dyspnea     with ambulation  . Frequent urination   . Arthritis   . Skin cancer     removed from face    Past Surgical History  Procedure Laterality Date  . Anterior lat lumbar fusion  04/07/2012    Procedure: ANTERIOR LATERAL LUMBAR FUSION 2 LEVELS;  Surgeon: Eustace Moore, MD;  Location: [facility];  Service: Neurosurgery;  Laterality: Right;  Lumbar three-four and four-five extreme lumbar interbody fusion  . Tonsillectomy    . Eye surgery      tightened muscles in right eye  . Back surgery    . Colonoscopy w/ polypectomy    . Cardiac catheterization      Prior to Admission medications   Medication Sig Start Date End Date Taking? Authorizing Provider  amitriptyline (ELAVIL) 25 MG tablet Take 25 mg by mouth at bedtime.   Yes Historical Provider, MD  Ascorbic Acid (VITAMIN C) 1000 MG tablet Take 1,000 mg by mouth daily.   Yes Historical Provider, MD  aspirin EC 81 MG tablet Take 81 mg by mouth daily.   Yes Historical Provider, MD  b complex vitamins tablet Take 1 tablet by mouth daily.   Yes Historical Provider, MD  calcium carbonate (OS-CAL) 600 MG TABS Take 600 mg by mouth daily.   Yes Historical Provider, MD  Coenzyme Q10 (CO Q-10) 200 MG CAPS Take 200 mg by mouth daily.    Yes  Historical Provider, MD  losartan-hydrochlorothiazide (HYZAAR) 100-12.5 MG per tablet TAKE 1 TABLET BY MOUTH EVERY DAY Patient taking differently: Take 1 tablet by mouth every day 07/27/14  Yes Janith Lima, MD  metoprolol (LOPRESSOR) 50 MG tablet Take 1 tablet (50 mg total) by mouth 2 (two) times daily. 05/24/14  Yes Lelon Perla, MD  Omega-3 Fatty Acids (FISH OIL) 1000 MG CAPS Take 1,000 mg by mouth daily.   Yes Historical Provider, MD  simvastatin (ZOCOR) 40 MG tablet TAKE 1 TABLET (40 MG TOTAL) BY MOUTH EVERY EVENING. Patient taking differently: Take 40 mg by mouth daily 10/22/13  Yes Janith Lima, MD  tiZANidine (ZANAFLEX) 4 MG tablet Take 4 mg by mouth daily.  04/25/13  Yes Historical Provider, MD  vitamin E 400 UNIT capsule Take 400 Units by mouth daily.   Yes Historical Provider, MD  tadalafil (CIALIS) 20 MG tablet Take 1 tablet (20 mg total) by mouth daily as needed for erectile dysfunction. Patient not taking: Reported on 09/09/2014 10/16/12   Janith Lima, MD   Allergies  Allergen Reactions  . Cephalexin Anaphylaxis  . Indocin [Indomethacin] Other (See Comments)     Gi bleed    History  Substance Use Topics  . Smoking status: Current Some  Day Smoker    Types: Cigarettes    Last Attempt to Quit: 08/16/2006  . Smokeless tobacco: Never Used  . Alcohol Use: Yes     Comment: Rare    Family History  Problem Relation Age of Onset  . Alcohol abuse Other   . Drug abuse Other   . Arthritis Other   . Hypertension Other      Review of Systems  Positive ROS: neg  All other systems have been reviewed and were otherwise negative with the exception of those mentioned in the HPI and as above.  Objective: Vital signs in last 24 hours: Temp:  [97.6 F (36.4 C)] 97.6 F (36.4 C) (02/03 0753) Pulse Rate:  [83] 83 (02/03 0753) Resp:  [20] 20 (02/03 0753) BP: (160)/(72) 160/72 mmHg (02/03 0753) SpO2:  [97 %] 97 % (02/03 0753) Weight:  [260 lb (117.935 kg)] 260 lb (117.935 kg)  (02/03 0753)  General Appearance: Alert, cooperative, no distress, appears stated age Head: Normocephalic, without obvious abnormality, atraumatic Eyes: PERRL, conjunctiva/corneas clear, EOM's intact    Neck: Supple, symmetrical, trachea midline Back: Symmetric, no curvature, ROM normal, no CVA tenderness Lungs:  respirations unlabored Heart: Regular rate and rhythm Abdomen: Soft, non-tender Extremities: Extremities normal, atraumatic, no cyanosis or edema Pulses: 2+ and symmetric all extremities Skin: Skin color, texture, turgor normal, no rashes or lesions  NEUROLOGIC:   Mental status: Alert and oriented x4,  no aphasia, good attention span, fund of knowledge, and memory Motor Exam - grossly normal Sensory Exam - grossly normal Reflexes: 1+ Coordination - grossly normal Gait - grossly normal Balance - grossly normal Cranial Nerves: I: smell Not tested  II: visual acuity  OS: nl    OD: nl  II: visual fields Full to confrontation  II: pupils Equal, round, reactive to light  III,VII: ptosis None  III,IV,VI: extraocular muscles  Full ROM  V: mastication Normal  V: facial light touch sensation  Normal  V,VII: corneal reflex  Present  VII: facial muscle function - upper  Normal  VII: facial muscle function - lower Normal  VIII: hearing Not tested  IX: soft palate elevation  Normal  IX,X: gag reflex Present  XI: trapezius strength  5/5  XI: sternocleidomastoid strength 5/5  XI: neck flexion strength  5/5  XII: tongue strength  Normal    Data Review Lab Results  Component Value Date   WBC 5.2 09/10/2014   HGB 16.2 09/10/2014   HCT 44.9 09/10/2014   MCV 88.9 09/10/2014   PLT 155 09/10/2014   Lab Results  Component Value Date   NA 138 09/10/2014   K 3.9 09/10/2014   CL 100 09/10/2014   CO2 31 09/10/2014   BUN 14 09/10/2014   CREATININE 1.29 09/10/2014   GLUCOSE 145* 09/10/2014   Lab Results  Component Value Date   INR 0.96 09/10/2014     Assessment/Plan: Patient admitted for L5-S1 fusion. Patient has failed a reasonable attempt at conservative therapy.  I explained the condition and procedure to the patient and answered any questions.  Patient wishes to proceed with procedure as planned. Understands risks/ benefits and typical outcomes of procedure.   Neriah Brott S 09/18/2014 9:17 AM

## 2014-09-18 NOTE — Op Note (Signed)
09/18/2014  1:21 PM  PATIENT:  Jack Farmer  74 y.o. male  PRE-OPERATIVE DIAGNOSIS: Adjacent level spondylosis, degenerative disc disease and stenosis L5-S1, back and leg pain  POST-OPERATIVE DIAGNOSIS:  Same  PROCEDURE:   1. Decompressive lumbar laminectomy L5-S1 requiring more work than would be required for a simple exposure of the disk for PLIF in order to adequately decompress the neural elements and address the spinal stenosis 2. Posterior fixation L5-S1 using Nuvasive pedicle screws.  3. Intertransverse arthrodesis L5-S1 using morcellized autograft and allograft.  SURGEON:  Sherley Bounds, MD  ASSISTANTS: Dr. Christella Noa  ANESTHESIA:  General  EBL: 250 ml  Total I/O In: 2000 [I.V.:2000] Out: 380 [Urine:130; Blood:250]  BLOOD ADMINISTERED:none  DRAINS: Hemovac   INDICATION FOR PROCEDURE: This patient underwent a previous lumbar fusion from L3-L5. He had auto fusions from T11-L3. He presented with chronic severe low back pain with radiation into the hips. He had a CT scan with myelogram that showed spondylosis, degenerative disc disease and stenosis at L5-S1 adjacent to his multilevel fusion. He tried medical management without relief. I recommended decompression and instrumented fusion at L5-S1. Patient understood the risks, benefits, and alternatives and potential outcomes and wished to proceed.  PROCEDURE DETAILS:  The patient was brought to the operating room. After induction of generalized endotracheal anesthesia the patient was rolled into the prone position on chest rolls and all pressure points were padded. The patient's lumbar region was cleaned and then prepped with DuraPrep and draped in the usual sterile fashion. Anesthesia was injected and then a dorsal midline incision was made and carried down to the lumbosacral fascia. The fascia was opened and the paraspinous musculature was taken down in a subperiosteal fashion to expose L5-S1. A self-retaining retractor was  placed. Intraoperative fluoroscopy confirmed my level, and I started with placement of the L5 cortical pedicle screws. The pedicle screw entry zones were identified utilizing surface landmarks and  AP and lateral fluoroscopy. I scored the cortex with the high-speed drill and then used the hand drill and EMG monitoring to drill an upward and outward direction into the pedicle. I then tapped line to line, and the tap was also monitored. I then placed a 5-0 x 35 mm cortical pedicle screw into the pedicles of L5 bilaterally. He had an existing L5 pedicle screw on the right but I was able to sneak this cortical screw by the existing pedicle screw. I then turned my attention to the decompression and the spinous process was removed and complete lumbar laminectomies, hemi- facetectomies, and foraminotomies were performed at L5-S1. The patient had significant spinal stenosis and this required more work than would be required for a simple exposure of the disc for posterior lumbar interbody fusion. Much more generous decompression was undertaken in order to adequately decompress the neural elements and address the patient's leg pain. The yellow ligament was removed to expose the underlying dura and nerve roots, and generous foraminotomies were performed to adequately decompress the neural elements. Both the exiting and traversing nerve roots were decompressed on both sides until a coronary dilator passed easily along the nerve roots. Once the decompression was complete, I turned my attention to the posterior lower lumbar interbody fusion. 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 I decided that I may be able to perform a TLIF from the left. The epidural venous vasculature was coagulated and cut sharply on the left.. Disc space was incised and the initial discectomy was performed with pituitary rongeurs,  but I could not enter the disc space because of the posterior osteophytes and the collapse of the posterior part of  the disc space. Therefore I decided to simply perform a posterior lateral fusion. We then turned our attention to the placement of the lower pedicle screws. The pedicle screw entry zones were identified utilizing surface landmarks and fluoroscopy. I drilled into each pedicle utilizing the hand drill and EMG monitoring, and tapped each pedicle with the appropriate tap. We palpated with a ball probe to assure no break in the cortex. We then placed 6.5 x 35 mm pedicle screws into the pedicles bilaterally at S1. We then decorticated the transverse processes and laid a mixture of morcellized autograft and allograft out over these to perform intertransverse arthrodesis at L5-S1 bilaterally. We then placed lordotic rods into the multiaxial screw heads of the pedicle screws and locked these in position with the locking caps and anti-torque device. We then checked our construct with AP and lateral fluoroscopy. Irrigated with copious amounts of bacitracin-containing saline solution. Placed a medium Hemovac drain through separate stab incision. Inspected the nerve roots once again to assure adequate decompression, lined to the dura with Gelfoam, and closed the muscle and the fascia with 0 Vicryl. Closed the subcutaneous tissues with 2-0 Vicryl and subcuticular tissues with 3-0 Vicryl. The skin was closed with benzoin and Steri-Strips. Dressing was then applied, the patient was awakened from general anesthesia and transported to the recovery room in stable condition. At the end of the procedure all sponge, needle and instrument counts were correct.   PLAN OF CARE: Admit to inpatient   PATIENT DISPOSITION:  PACU - hemodynamically stable.   Delay start of Pharmacological VTE agent (>24hrs) due to surgical blood loss or risk of bleeding:  yes

## 2014-09-18 NOTE — Plan of Care (Signed)
Problem: Consults Goal: Diagnosis - Spinal Surgery Outcome: Completed/Met Date Met:  09/18/14 Thoraco/Lumbar Spine Fusion

## 2014-09-19 MED ORDER — PANTOPRAZOLE SODIUM 40 MG IV SOLR
40.0000 mg | Freq: Once | INTRAVENOUS | Status: DC
  Filled 2014-09-19: qty 40

## 2014-09-19 MED ORDER — PANTOPRAZOLE SODIUM 40 MG PO TBEC
40.0000 mg | DELAYED_RELEASE_TABLET | Freq: Every day | ORAL | Status: DC
  Administered 2014-09-19: 40 mg via ORAL

## 2014-09-19 MED ORDER — ALUM & MAG HYDROXIDE-SIMETH 200-200-20 MG/5ML PO SUSP
30.0000 mL | Freq: Four times a day (QID) | ORAL | Status: DC | PRN
  Administered 2014-09-19: 30 mL via ORAL
  Filled 2014-09-19: qty 30

## 2014-09-19 NOTE — Progress Notes (Signed)
Physical Therapy Treatment Patient Details Name: Jack Farmer MRN: [id number] DOB: 09/10/1940 Today's Date: 09/19/2014    History of Present Illness  pt admitted for a lumbar decompression and PLIF at L5 S1    PT Comments    All education completed.  Pt at a supervision level, soon to be independent in a home-like environment.  No further PT needs.  Will sign off.  Follow Up Recommendations  No PT follow up     Equipment Recommendations  None recommended by PT    Recommendations for Other Services       Precautions / Restrictions Precautions Precautions: Back Required Braces or Orthoses: Spinal Brace Spinal Brace: Applied in sitting position    Mobility  Bed Mobility Overal bed mobility: Needs Assistance Bed Mobility: Sidelying to Sit Rolling: Supervision Sidelying to sit: Supervision     Sit to sidelying: Supervision General bed mobility comments: reinforced safest technique  Transfers Overall transfer level: Needs assistance Equipment used: Rolling walker (2 wheeled) Transfers: Sit to/from [facility] Sit to Stand: Supervision Stand pivot transfers: Supervision       General transfer comment: vc to maintain upright posture. Able to return demonstraate in later transfers  Ambulation/Gait Ambulation/Gait assistance: Supervision Ambulation Distance (Feet): 140 Feet Assistive device: Rolling walker (2 wheeled) Gait Pattern/deviations: Step-through pattern;Trunk flexed Gait velocity: slow   General Gait Details: slow and guarded   Stairs Stairs: Yes Stairs assistance: Supervision Stair Management: One rail Right;Step to pattern;Forwards Number of Stairs: 4 General stair comments: safe with rail  Wheelchair Mobility    Modified Rankin (Stroke Patients Only)       Balance Overall balance assessment: No apparent balance deficits (not formally assessed)                                  Cognition  Arousal/Alertness: Awake/alert Behavior During Therapy: WFL for tasks assessed/performed Overall Cognitive Status: Within Functional Limits for tasks assessed                      Exercises      General Comments General comments (skin integrity, edema, etc.): reinforced all back education and pt return demo'd understanding.      Pertinent Vitals/Pain Pain Assessment: Faces Faces Pain Scale: Hurts whole lot Pain Location: L hip Pain Descriptors / Indicators: Burning;Aching Pain Intervention(s): Monitored during session    Home Living                      Prior Function            PT Goals (current goals can now be found in the care plan section) Acute Rehab PT Goals Patient Stated Goal: home/independent PT Goal Formulation: With patient Time For Goal Achievement: 09/25/14 Potential to Achieve Goals: Good Progress towards PT goals: Progressing toward goals    Frequency  Min 5X/week    PT Plan Current plan remains appropriate    Co-evaluation             End of Session Equipment Utilized During Treatment: Back brace Activity Tolerance: Patient tolerated treatment well Patient left: in bed;with call bell/phone within reach;with family/visitor present     Time: 3846-6599 PT Time Calculation (min) (ACUTE ONLY): 25 min  Charges:  $Gait Training: 8-22 mins $Self Care/Home Management: 8-22  G Codes:      Brithany Whitworth, Tessie Fass 09/19/2014, 4:52 PM 09/19/2014  Donnella Sham, PT [phone]  (pager)

## 2014-09-19 NOTE — Progress Notes (Signed)
Utilization review completed.  

## 2014-09-19 NOTE — Progress Notes (Signed)
Occupational Therapy Evaluation Patient Details Name: Jack Farmer MRN: [id number] DOB: 06-12-1941 Today's Date: 09/19/2014    History of Present Illness  pt admitted for a lumbar decompression and PLIF at L5 S1   Clinical Impression   Completed all education regarding ADL and functional mobility adhering to back precautions. No DME needs. Pt ready for D/C when medically stable. OT signing off.     Follow Up Recommendations  No OT follow up;Supervision - Intermittent    Equipment Recommendations  None recommended by OT    Recommendations for Other Services       Precautions / Restrictions Precautions Precautions: Back Required Braces or Orthoses: Spinal Brace Spinal Brace: Applied in sitting position Restrictions Weight Bearing Restrictions: No      Mobility Bed Mobility Overal bed mobility: Needs Assistance Bed Mobility: Sidelying to Sit Rolling: Supervision            Transfers Overall transfer level: Needs assistance Equipment used: Rolling walker (2 wheeled)   Sit to Stand: Supervision         General transfer comment: vc to maintain upright posture. Able to return demonstrate in later transfers    Balance Overall balance assessment: No apparent balance deficits (not formally assessed)                                          ADL Overall ADL's : Needs assistance/impaired     Grooming: Set up;Oral care;Standing;Cueing for safety Grooming Details (indicate cue type and reason): educated on compensatory techniques Upper Body Bathing: Set up;Supervision/ safety   Lower Body Bathing: Minimal assistance;Sit to/from stand   Upper Body Dressing : Set up;Sitting Upper Body Dressing Details (indicate cue type and reason): able to donn brace independently Lower Body Dressing: Minimal assistance;Sit to/from stand   Toilet Transfer: Supervision/safety;Ambulation;RW   Toileting- Clothing Manipulation and Hygiene: Minimal  assistance;Sit to/from stand Toileting - Clothing Manipulation Details (indicate cue type and reason): Educated on use of tongs to assist with jhygiene after toileting     Functional mobility during ADLs: Supervision/safety;Rolling walker (vc for precuaitons) General ADL Comments: Educated pt/wife on ADL while adhering to back precautions, including use of BSC for toileting and in shower. educated on home safety and reducing falls.                                     Pertinent Vitals/Pain Pain Assessment: 0-10 Pain Score: 3  Pain Location: back  Pain Descriptors / Indicators: Aching Pain Intervention(s): Limited activity within patient's tolerance;Monitored during session     Hand Dominance     Extremity/Trunk Assessment Upper Extremity Assessment Upper Extremity Assessment: Overall WFL for tasks assessed   Lower Extremity Assessment Lower Extremity Assessment: Defer to PT evaluation   Cervical / Trunk Assessment Cervical / Trunk Assessment: Other exceptions (forward head)   Communication Communication Communication: No difficulties   Cognition Arousal/Alertness: Awake/alert   Overall Cognitive Status: Within Functional Limits for tasks assessed                                        Home Living Family/patient expects to be discharged to:: Private residence Living Arrangements: Spouse/significant other Available Help at Discharge: Family Type of Home: House  Home Access: Stairs to enter Entrance Stairs-Number of Steps: 2 Entrance Stairs-Rails: Right;Left Home Layout: One level     Bathroom Shower/Tub: Walk-in shower         Home Equipment: Environmental consultant - 2 wheels;Bedside commode;Shower seat          Prior Functioning/Environment Level of Independence: Independent             OT Diagnosis: Generalized weakness;Acute pain   OT Problem List: Decreased activity tolerance;Decreased knowledge of use of DME or AE;Decreased knowledge  of precautions;Pain   OT Treatment/Interventions:      OT Goals(Current goals can be found in the care plan section) Acute Rehab OT Goals Patient Stated Goal: home/independent OT Goal Formulation: All assessment and education complete, DC therapy  OT Frequency:     Barriers to D/C:            Co-evaluation              End of Session Equipment Utilized During Treatment: Gait belt;Rolling walker;Back brace Nurse Communication: Mobility status  Activity Tolerance: Patient tolerated treatment well Patient left: in chair;with call bell/phone within reach;with family/visitor present   Time: 4166-0630 OT Time Calculation (min): 36 min Charges:  OT General Charges $OT Visit: 1 Procedure OT Evaluation $Initial OT Evaluation Tier I: 1 Procedure OT Treatments $Self Care/Home Management : 8-22 mins G-Codes:    Jafari Mckillop,HILLARY 10-08-14, 10:05 AM   Maurie Boettcher, OTR/L  [phone] October 08, 2014

## 2014-09-19 NOTE — Progress Notes (Signed)
Subjective: Patient reports he has been doing extremely well with no pain until just laid down after walking. No leg pain or NTW. Has taken no meds.  Objective: Vital signs in last 24 hours: Temp:  [97.9 F (36.6 C)-98.6 F (37 C)] 98.1 F (36.7 C) (02/04 0756) Pulse Rate:  [77-108] 91 (02/04 0756) Resp:  [14-22] 18 (02/04 0756) BP: (124-155)/(45-72) 144/68 mmHg (02/04 0756) SpO2:  [93 %-100 %] 94 % (02/04 0756)  Intake/Output from previous day: 02/03 0730 - 02/04 0729 In: 3040 [P.O.:840; I.V.:2200] Out: 2170 [Urine:1655; Drains:265; Blood:250] Intake/Output this shift: Total I/O In: 120 [P.O.:120] Out: 100 [Urine:100]  Neurologic: Grossly normal  Lab Results: Lab Results  Component Value Date   WBC 5.2 09/10/2014   HGB 16.2 09/10/2014   HCT 44.9 09/10/2014   MCV 88.9 09/10/2014   PLT 155 09/10/2014   Lab Results  Component Value Date   INR 0.96 09/10/2014   BMET Lab Results  Component Value Date   NA 138 09/10/2014   K 3.9 09/10/2014   CL 100 09/10/2014   CO2 31 09/10/2014   GLUCOSE 145* 09/10/2014   BUN 14 09/10/2014   CREATININE 1.29 09/10/2014   CALCIUM 9.4 09/10/2014    Studies/Results: Dg Lumbar Spine 2-3 Views  09/18/2014   CLINICAL DATA:  Posterior fusion with L5-S1 laminectomy  EXAM: DG C-ARM 61-120 MIN; LUMBAR SPINE - 2-3 VIEW  FLUOROSCOPY TIME:  If the device does not provide the exposure index:  Fluoroscopy Time (in minutes and seconds):  1 min 17 seconds  Number of Acquired Images:  2  COMPARISON:  April 07, 2012  FINDINGS: Frontal and lateral views were obtained. There are pedicle screws on the right at L5 and S1 bilaterally with the screws extending into the respective vertebral bodies. Previously placed pedicle screws more superiorly at L3, L4, and L5 appear essentially stable compared previous study. Previously placed screws appear slightly more medially oriented than the current screws. The significance of this finding is uncertain. There is no  fracture or spondylolisthesis. There are disc spacers at L4-5 and L3-4. There is disc narrowing at L5-S1. No fracture or spondylolisthesis.  IMPRESSION: New screw and plate fixation at W0-9 and L5-S1 bilaterally. Previously placed pedicle screws on the right do not appear appreciably changed compared to prior study. No fracture or spondylolisthesis.   Electronically Signed   By: Lowella Grip M.D.   On: 09/18/2014 13:22   Dg C-arm 1-60 Min  09/18/2014   CLINICAL DATA:  Posterior fusion with L5-S1 laminectomy  EXAM: DG C-ARM 61-120 MIN; LUMBAR SPINE - 2-3 VIEW  FLUOROSCOPY TIME:  If the device does not provide the exposure index:  Fluoroscopy Time (in minutes and seconds):  1 min 17 seconds  Number of Acquired Images:  2  COMPARISON:  April 07, 2012  FINDINGS: Frontal and lateral views were obtained. There are pedicle screws on the right at L5 and S1 bilaterally with the screws extending into the respective vertebral bodies. Previously placed pedicle screws more superiorly at L3, L4, and L5 appear essentially stable compared previous study. Previously placed screws appear slightly more medially oriented than the current screws. The significance of this finding is uncertain. There is no fracture or spondylolisthesis. There are disc spacers at L4-5 and L3-4. There is disc narrowing at L5-S1. No fracture or spondylolisthesis.  IMPRESSION: New screw and plate fixation at W1-1 and L5-S1 bilaterally. Previously placed pedicle screws on the right do not appear appreciably changed compared to prior study.  No fracture or spondylolisthesis.   Electronically Signed   By: Lowella Grip M.D.   On: 09/18/2014 13:22    Assessment/Plan: Doing very well, home tomorrow?   LOS: 1 day    JONES,DAVID S 09/19/2014, 9:45 AM

## 2014-09-20 LAB — BASIC METABOLIC PANEL
ANION GAP: 3 — AB (ref 5–15)
BUN: 16 mg/dL (ref 6–23)
CALCIUM: 9.2 mg/dL (ref 8.4–10.5)
CHLORIDE: 102 mmol/L (ref 96–112)
CO2: 34 mmol/L — AB (ref 19–32)
Creatinine, Ser: 1.42 mg/dL — ABNORMAL HIGH (ref 0.50–1.35)
GFR calc Af Amer: 55 mL/min — ABNORMAL LOW (ref >90)
GFR calc non Af Amer: 47 mL/min — ABNORMAL LOW (ref >90)
Glucose, Bld: 119 mg/dL — ABNORMAL HIGH (ref 70–99)
Potassium: 4.5 mmol/L (ref 3.5–5.1)
SODIUM: 139 mmol/L (ref 135–145)

## 2014-09-20 MED ORDER — OXYCODONE-ACETAMINOPHEN 5-325 MG PO TABS: 1.0000 | ORAL_TABLET | ORAL | Status: DC | PRN

## 2014-09-20 MED ORDER — TIZANIDINE HCL 4 MG PO TABS: 4.0000 mg | ORAL_TABLET | Freq: Three times a day (TID) | ORAL | Status: DC | PRN

## 2014-09-20 NOTE — Discharge Summary (Signed)
Physician Discharge Summary  Patient ID: Jack Farmer MRN: [id number] DOB/AGE: 73-Apr-1942 73 y.o.  Admit date: 09/18/2014 Discharge date: 09/20/2014  Admission Diagnoses: stenosis/DDD   Discharge Diagnoses: same   Discharged Condition: good  Hospital Course: The patient was admitted on 09/18/2014 and taken to the operating room where the patient underwent lumbar laminectomy and fusion L5-s1. The patient tolerated the procedure well and was taken to the recovery room and then to the floor in stable condition. The hospital course was routine. There were no complications. The wound remained clean dry and intact. Pt had appropriate back soreness. No complaints of leg pain or new N/T/W. The patient remained afebrile with stable vital signs, and tolerated a regular diet. The patient continued to increase activities, and pain was well controlled with oral pain medications.   Consults: None  Significant Diagnostic Studies:  Results for orders placed or performed during the hospital encounter of 74/94/49  Basic metabolic panel  Result Value Ref Range   Sodium 139 135 - 145 mmol/L   Potassium 4.5 3.5 - 5.1 mmol/L   Chloride 102 96 - 112 mmol/L   CO2 34 (H) 19 - 32 mmol/L   Glucose, Bld 119 (H) 70 - 99 mg/dL   BUN 16 6 - 23 mg/dL   Creatinine, Ser 1.42 (H) 0.50 - 1.35 mg/dL   Calcium 9.2 8.4 - 10.5 mg/dL   GFR calc non Af Amer 47 (L) >90 mL/min   GFR calc Af Amer 55 (L) >90 mL/min   Anion gap 3 (L) 5 - 15    Chest 2 View  09/10/2014   CLINICAL DATA:  Hypertension.  EXAM: CHEST  2 VIEW  COMPARISON:  03/31/2012 .  FINDINGS: Mediastinum and hilar structures normal. Lungs are clear of acute infiltrates. Bibasilar atelectasis and or scarring in the and noted. No pleural effusion or pneumothorax. Stable cardiomegaly. No acute bony abnormality.  IMPRESSION: 1. Basilar subsegmental atelectasis and/or pleural parenchymal scarring. 2. No acute pulmonary infiltrate.  Stable cardiomegaly.    Electronically Signed   By: Marcello Moores  Register   On: 09/10/2014 14:04   Dg Lumbar Spine 2-3 Views  09/18/2014   CLINICAL DATA:  Posterior fusion with L5-S1 laminectomy  EXAM: DG C-ARM 61-120 MIN; LUMBAR SPINE - 2-3 VIEW  FLUOROSCOPY TIME:  If the device does not provide the exposure index:  Fluoroscopy Time (in minutes and seconds):  1 min 17 seconds  Number of Acquired Images:  2  COMPARISON:  April 07, 2012  FINDINGS: Frontal and lateral views were obtained. There are pedicle screws on the right at L5 and S1 bilaterally with the screws extending into the respective vertebral bodies. Previously placed pedicle screws more superiorly at L3, L4, and L5 appear essentially stable compared previous study. Previously placed screws appear slightly more medially oriented than the current screws. The significance of this finding is uncertain. There is no fracture or spondylolisthesis. There are disc spacers at L4-5 and L3-4. There is disc narrowing at L5-S1. No fracture or spondylolisthesis.  IMPRESSION: New screw and plate fixation at Q7-5 and L5-S1 bilaterally. Previously placed pedicle screws on the right do not appear appreciably changed compared to prior study. No fracture or spondylolisthesis.   Electronically Signed   By: Lowella Grip M.D.   On: 09/18/2014 13:22   Dg C-arm 1-60 Min  09/18/2014   CLINICAL DATA:  Posterior fusion with L5-S1 laminectomy  EXAM: DG C-ARM 61-120 MIN; LUMBAR SPINE - 2-3 VIEW  FLUOROSCOPY TIME:  If the device  does not provide the exposure index:  Fluoroscopy Time (in minutes and seconds):  1 min 17 seconds  Number of Acquired Images:  2  COMPARISON:  April 07, 2012  FINDINGS: Frontal and lateral views were obtained. There are pedicle screws on the right at L5 and S1 bilaterally with the screws extending into the respective vertebral bodies. Previously placed pedicle screws more superiorly at L3, L4, and L5 appear essentially stable compared previous study. Previously placed screws  appear slightly more medially oriented than the current screws. The significance of this finding is uncertain. There is no fracture or spondylolisthesis. There are disc spacers at L4-5 and L3-4. There is disc narrowing at L5-S1. No fracture or spondylolisthesis.  IMPRESSION: New screw and plate fixation at Y7-8 and L5-S1 bilaterally. Previously placed pedicle screws on the right do not appear appreciably changed compared to prior study. No fracture or spondylolisthesis.   Electronically Signed   By: Lowella Grip M.D.   On: 09/18/2014 13:22    Antibiotics:  Anti-infectives    Start     Dose/Rate Route Frequency Ordered Stop   09/18/14 2100  vancomycin (VANCOCIN) IVPB 750 mg/150 ml premix     750 mg150 mL/hr over 60 Minutes Intravenous Every 12 hours 09/18/14 1612     09/18/14 1031  bacitracin 50,000 Units in sodium chloride irrigation 0.9 % 500 mL irrigation  Status:  Discontinued       As needed 09/18/14 1031 09/18/14 1327   09/18/14 0600  vancomycin (VANCOCIN) 1,500 mg in sodium chloride 0.9 % 500 mL IVPB     1,500 mg250 mL/hr over 120 Minutes Intravenous On call to O.R. 09/17/14 1326 09/18/14 1030      Discharge Exam: Blood pressure 108/44, pulse 86, temperature 98.9 F (37.2 C), temperature source Oral, resp. rate 18, height 6\' 2"  (1.88 m), weight 260 lb (117.935 kg), SpO2 95 %. Neurologic: Grossly normal Dressing dry  Discharge Medications:     Medication List    TAKE these medications        amitriptyline 25 MG tablet  Commonly known as:  ELAVIL  Take 25 mg by mouth at bedtime.     aspirin EC 81 MG tablet  Take 81 mg by mouth daily.     b complex vitamins tablet  Take 1 tablet by mouth daily.     calcium carbonate 600 MG Tabs tablet  Commonly known as:  OS-CAL  Take 600 mg by mouth daily.     Co Q-10 200 MG Caps  Take 200 mg by mouth daily.     Fish Oil 1000 MG Caps  Take 1,000 mg by mouth daily.     losartan-hydrochlorothiazide 100-12.5 MG per tablet   Commonly known as:  HYZAAR  TAKE 1 TABLET BY MOUTH EVERY DAY     metoprolol 50 MG tablet  Commonly known as:  LOPRESSOR  Take 1 tablet (50 mg total) by mouth 2 (two) times daily.     oxyCODONE-acetaminophen 5-325 MG per tablet  Commonly known as:  PERCOCET/ROXICET  Take 1-2 tablets by mouth every 4 (four) hours as needed for moderate pain.     simvastatin 40 MG tablet  Commonly known as:  ZOCOR  TAKE 1 TABLET (40 MG TOTAL) BY MOUTH EVERY EVENING.     tadalafil 20 MG tablet  Commonly known as:  CIALIS  Take 1 tablet (20 mg total) by mouth daily as needed for erectile dysfunction.     tiZANidine 4 MG tablet  Commonly known  as:  ZANAFLEX  Take 1 tablet (4 mg total) by mouth every 8 (eight) hours as needed for muscle spasms.     vitamin C 1000 MG tablet  Take 1,000 mg by mouth daily.     vitamin E 400 UNIT capsule  Take 400 Units by mouth daily.        Disposition: home   Final Dx: lumbar fusion L5-s1      Discharge Instructions    Call MD for:  difficulty breathing, headache or visual disturbances    Complete by:  As directed      Call MD for:  persistant nausea and vomiting    Complete by:  As directed      Call MD for:  redness, tenderness, or signs of infection (pain, swelling, redness, odor or green/yellow discharge around incision site)    Complete by:  As directed      Call MD for:  severe uncontrolled pain    Complete by:  As directed      Call MD for:  temperature >100.4    Complete by:  As directed      Diet - low sodium heart healthy    Complete by:  As directed      Discharge instructions    Complete by:  As directed   No heavy lifting, no bending or twisting, may shower, no driving     Increase activity slowly    Complete by:  As directed      Remove dressing in 48 hours    Complete by:  As directed            Follow-up Information    Follow up with JONES,DAVID S, MD. Schedule an appointment as soon as possible for a visit in 2 weeks.    Specialty:  Neurosurgery   Contact information:   [address] [place] [id number] [phone]        Signed: Eustace Moore 09/20/2014, 7:45 AM

## 2014-09-20 NOTE — Discharge Instructions (Signed)

## 2014-09-20 NOTE — Progress Notes (Signed)
Pt doing well. Pt and wife given D/C instructions with Rx's, verbal understanding was provided. Pt's IV and hemovac were removed prior to D/C. Pt's incision is clean and dry with no sign of infection. Pt D/C'd home via wheelchair @ 1005 per MD order. Pt is stable @ D/C and has no other needs at this time. Holli Humbles, RN

## 2014-10-13 ENCOUNTER — Other Ambulatory Visit: Payer: Self-pay | Admitting: Internal Medicine

## 2014-10-17 ENCOUNTER — Ambulatory Visit (INDEPENDENT_AMBULATORY_CARE_PROVIDER_SITE_OTHER): Payer: Medicare Other | Admitting: Internal Medicine

## 2014-10-17 VITALS — BP 140/80 | HR 93 | Temp 98.1°F | Resp 16 | Ht 74.0 in | Wt 257.0 lb

## 2014-10-17 DIAGNOSIS — R739 Hyperglycemia, unspecified: Secondary | ICD-10-CM | POA: Diagnosis not present

## 2014-10-17 DIAGNOSIS — H6123 Impacted cerumen, bilateral: Secondary | ICD-10-CM | POA: Diagnosis not present

## 2014-10-17 DIAGNOSIS — I10 Essential (primary) hypertension: Secondary | ICD-10-CM

## 2014-10-17 DIAGNOSIS — N400 Enlarged prostate without lower urinary tract symptoms: Secondary | ICD-10-CM

## 2014-10-17 HISTORY — DX: Benign prostatic hyperplasia without lower urinary tract symptoms: N40.0

## 2014-10-17 MED ORDER — TAMSULOSIN HCL 0.4 MG PO CAPS: 0.4000 mg | ORAL_CAPSULE | Freq: Every day | ORAL | Status: DC

## 2014-10-17 NOTE — Patient Instructions (Signed)
Benign Prostatic Hyperplasia An enlarged prostate (benign prostatic hyperplasia) is common in older men. You may experience the following:  Weak urine stream.  Dribbling.  Feeling like the bladder has not emptied completely.  Difficulty starting urination.  Getting up frequently at night to urinate.  Urinating more frequently during the day. HOME CARE INSTRUCTIONS  Monitor your prostatic hyperplasia for any changes. The following actions may help to alleviate any discomfort you are experiencing:  Give yourself time when you urinate.  Stay away from alcohol.  Avoid beverages containing caffeine, such as coffee, tea, and colas, because they can make the problem worse.  Avoid decongestants, antihistamines, and some prescription medicines that can make the problem worse.  Follow up with your health care provider for further treatment as recommended. SEEK MEDICAL CARE IF:  You are experiencing progressive difficulty voiding.  Your urine stream is progressively getting narrower.  You are awaking from sleep with the urge to void more frequently.  You are constantly feeling the need to void.  You experience loss of urine, especially in small amounts. SEEK IMMEDIATE MEDICAL CARE IF:   You develop increased pain with urination or are unable to urinate.  You develop severe abdominal pain, vomiting, a high fever, or fainting.  You develop back pain or blood in your urine. MAKE SURE YOU:   Understand these instructions.  Will watch your condition.  Will get help right away if you are not doing well or get worse. Document Released: 08/02/2005 Document Revised: 04/04/2013 Document Reviewed: 01/02/2013 [employer] Patient Information 2015 [employer]. This information is not intended to replace advice given to you by your health care provider. Make sure you discuss any questions you have with your health care provider.  

## 2014-10-17 NOTE — Progress Notes (Signed)
Subjective:    Patient ID: Jack Farmer, male    DOB: 1941/06/14, 74 y.o.   MRN: [id number]  HPI Comments: He complains that he can't hear and feels that there may be wax in his ears. He is s/p back surgery and has had some issues with urine flow with frequency, hesitancy, straining. His NS started flomax and that has helped quite a bit. He needs a refill.  Benign Prostatic Hypertrophy This is a new problem. The current episode started 1 to 4 weeks ago. The problem has been gradually improving since onset. Irritative symptoms include frequency and nocturia. Irritative symptoms do not include urgency. Obstructive symptoms include dribbling, incomplete emptying, a slower stream, straining and a weak stream. Obstructive symptoms do not include an intermittent stream. Associated symptoms include hesitancy. Pertinent negatives include no chills, dysuria, genital pain, hematuria, nausea or vomiting. AUA score is 8-19. He is not sexually active. Past treatments include tamsulosin. The treatment provided significant relief. He has been using treatment for 1 to 4 weeks.      Review of Systems  Constitutional: Negative.  Negative for fever, chills, diaphoresis, appetite change and fatigue.  HENT: Positive for hearing loss. Negative for ear pain, postnasal drip, rhinorrhea, sinus pressure, sneezing, sore throat and trouble swallowing.   Eyes: Negative.   Respiratory: Negative.  Negative for cough, choking, chest tightness, shortness of breath and stridor.   Cardiovascular: Negative.  Negative for chest pain, palpitations and leg swelling.  Gastrointestinal: Negative.  Negative for nausea, vomiting and abdominal pain.  Endocrine: Negative.   Genitourinary: Positive for hesitancy, frequency, incomplete emptying and nocturia. Negative for dysuria, urgency and hematuria.  Musculoskeletal: Negative.  Negative for myalgias, back pain, arthralgias and neck pain.  Skin: Negative.  Negative for rash.    Allergic/Immunologic: Negative.   Neurological: Negative.   Hematological: Negative.  Negative for adenopathy. Does not bruise/bleed easily.  Psychiatric/Behavioral: Negative.        Objective:   Physical Exam  Constitutional: He is oriented to person, place, and time. He appears well-developed and well-nourished.  Non-toxic appearance. He does not have a sickly appearance. He does not appear ill. No distress.  HENT:  Right Ear: Hearing, tympanic membrane, external ear and ear canal normal.  Left Ear: Hearing, tympanic membrane, external ear and ear canal normal.  Mouth/Throat: No oropharyngeal exudate.  There is cerumen in B EAC, I instilled colace in B EAC's then irrigated with water and used an ear pic to remove the cerumen, he tolerated this well with no complications, the exam afterwards reveals the EAC's to be normal.  Eyes: Conjunctivae are normal. Right eye exhibits no discharge. Left eye exhibits no discharge. No scleral icterus.  Neck: Normal range of motion. Neck supple. No JVD present. No tracheal deviation present. No thyromegaly present.  Cardiovascular: Normal rate, regular rhythm, normal heart sounds and intact distal pulses.  Exam reveals no gallop and no friction rub.   No murmur heard. Pulmonary/Chest: Effort normal and breath sounds normal. No stridor. No respiratory distress. He has no wheezes. He has no rales. He exhibits no tenderness.  Abdominal: Soft. Bowel sounds are normal. He exhibits no distension and no mass. There is no tenderness. There is no rebound and no guarding.  Musculoskeletal: Normal range of motion. He exhibits no edema or tenderness.  Lymphadenopathy:    He has no cervical adenopathy.  Neurological: He is oriented to person, place, and time.  Skin: Skin is warm and dry. No rash noted. He is  not diaphoretic. No erythema. No pallor.  Vitals reviewed.     Lab Results  Component Value Date   WBC 5.2 09/10/2014   HGB 16.2 09/10/2014   HCT 44.9  09/10/2014   PLT 155 09/10/2014   GLUCOSE 119* 09/20/2014   CHOL 103 02/01/2014   TRIG 185.0* 02/01/2014   HDL 27.90* 02/01/2014   LDLDIRECT 56.3 10/16/2012   LDLCALC 38 02/01/2014   ALT 22 02/01/2014   AST 23 02/01/2014   NA 139 09/20/2014   K 4.5 09/20/2014   CL 102 09/20/2014   CREATININE 1.42* 09/20/2014   BUN 16 09/20/2014   CO2 34* 09/20/2014   TSH 1.74 02/01/2014   PSA 1.00 02/01/2014   INR 0.96 09/10/2014   HGBA1C 5.9 02/01/2014      Assessment & Plan:

## 2014-10-18 ENCOUNTER — Encounter: Payer: Self-pay | Admitting: Internal Medicine

## 2014-10-18 DIAGNOSIS — H6120 Impacted cerumen, unspecified ear: Secondary | ICD-10-CM | POA: Insufficient documentation

## 2014-10-18 NOTE — Assessment & Plan Note (Signed)
His BP is well controlled 

## 2014-10-18 NOTE — Assessment & Plan Note (Signed)
This has been exacerbated by the recent surgery and the use of opiate pain meds He has had a significant improvement with flomax, will continue

## 2014-10-24 ENCOUNTER — Ambulatory Visit (INDEPENDENT_AMBULATORY_CARE_PROVIDER_SITE_OTHER): Payer: Medicare Other | Admitting: Internal Medicine

## 2014-10-24 ENCOUNTER — Ambulatory Visit (INDEPENDENT_AMBULATORY_CARE_PROVIDER_SITE_OTHER)
Admission: RE | Admit: 2014-10-24 | Discharge: 2014-10-24 | Disposition: A | Discharge status: Home / Self Care | Payer: Medicare Other | Source: Ambulatory Visit | Attending: Internal Medicine | Admitting: Internal Medicine

## 2014-10-24 ENCOUNTER — Encounter: Payer: Self-pay | Admitting: Internal Medicine

## 2014-10-24 VITALS — BP 146/64 | HR 87 | Temp 98.5°F | Resp 16 | Ht 74.0 in | Wt 261.0 lb

## 2014-10-24 DIAGNOSIS — J189 Pneumonia, unspecified organism: Secondary | ICD-10-CM

## 2014-10-24 DIAGNOSIS — I10 Essential (primary) hypertension: Secondary | ICD-10-CM

## 2014-10-24 DIAGNOSIS — R05 Cough: Secondary | ICD-10-CM | POA: Diagnosis not present

## 2014-10-24 DIAGNOSIS — R0602 Shortness of breath: Secondary | ICD-10-CM | POA: Diagnosis not present

## 2014-10-24 MED ORDER — MOXIFLOXACIN HCL 400 MG PO TABS: 400.0000 mg | ORAL_TABLET | Freq: Every day | ORAL | Status: DC

## 2014-10-24 MED ORDER — HYDROCODONE-HOMATROPINE 5-1.5 MG/5ML PO SYRP: 5.0000 mL | ORAL_SOLUTION | Freq: Three times a day (TID) | ORAL | Status: DC | PRN

## 2014-10-24 NOTE — Assessment & Plan Note (Signed)
His BP is adequately well controlled 

## 2014-10-24 NOTE — Patient Instructions (Signed)
Cough, Adult  A cough is a reflex that helps clear your throat and airways. It can help heal the body or may be a reaction to an irritated airway. A cough may only last 2 or 3 weeks (acute) or may last more than 8 weeks (chronic).  CAUSES Acute cough:  Viral or bacterial infections. Chronic cough:  Infections.  Allergies.  Asthma.  Post-nasal drip.  Smoking.  Heartburn or acid reflux.  Some medicines.  Chronic lung problems (COPD).  Cancer. SYMPTOMS   Cough.  Fever.  Chest pain.  Increased breathing rate.  High-pitched whistling sound when breathing (wheezing).  Colored mucus that you cough up (sputum). TREATMENT   A bacterial cough may be treated with antibiotic medicine.  A viral cough must run its course and will not respond to antibiotics.  Your caregiver may recommend other treatments if you have a chronic cough. HOME CARE INSTRUCTIONS   Only take over-the-counter or prescription medicines for pain, discomfort, or fever as directed by your caregiver. Use cough suppressants only as directed by your caregiver.  Use a cold steam vaporizer or humidifier in your bedroom or home to help loosen secretions.  Sleep in a semi-upright position if your cough is worse at night.  Rest as needed.  Stop smoking if you smoke. SEEK IMMEDIATE MEDICAL CARE IF:   You have pus in your sputum.  Your cough starts to worsen.  You cannot control your cough with suppressants and are losing sleep.  You begin coughing up blood.  You have difficulty breathing.  You develop pain which is getting worse or is uncontrolled with medicine.  You have a fever. MAKE SURE YOU:   Understand these instructions.  Will watch your condition.  Will get help right away if you are not doing well or get worse. Document Released: 01/29/2011 Document Revised: 10/25/2011 Document Reviewed: 01/29/2011 [employer] Patient Information 2015 [employer]. This information is not intended  to replace advice given to you by your health care provider. Make sure you discuss any questions you have with your health care provider.  

## 2014-10-24 NOTE — Progress Notes (Signed)
Subjective:    Patient ID: Jack Farmer, male    DOB: 1941-05-08, 74 y.o.   MRN: [id number]  Cough This is a new problem. Episode onset: for 3 days. The problem has been unchanged. The cough is productive of purulent sputum. Associated symptoms include chills, a fever, postnasal drip, rhinorrhea, a sore throat and shortness of breath. Pertinent negatives include no chest pain, ear congestion, ear pain, headaches, heartburn, hemoptysis, myalgias, nasal congestion, rash, sweats, weight loss or wheezing. Risk factors for lung disease include smoking/tobacco exposure. He has tried OTC cough suppressant for the symptoms. The treatment provided mild relief. There is no history of asthma, bronchiectasis, bronchitis, COPD, emphysema, environmental allergies or pneumonia.      Review of Systems  Constitutional: Positive for fever and chills. Negative for weight loss, diaphoresis, activity change, appetite change, fatigue and unexpected weight change.  HENT: Positive for postnasal drip, rhinorrhea and sore throat. Negative for ear pain, sinus pressure, trouble swallowing and voice change.   Eyes: Negative.   Respiratory: Positive for cough and shortness of breath. Negative for hemoptysis and wheezing.   Cardiovascular: Negative.  Negative for chest pain, palpitations and leg swelling.  Gastrointestinal: Negative.  Negative for heartburn, nausea, vomiting, abdominal pain, diarrhea, constipation and blood in stool.  Endocrine: Negative.   Genitourinary: Negative.   Musculoskeletal: Negative.  Negative for myalgias.  Skin: Negative.  Negative for rash.  Allergic/Immunologic: Negative.  Negative for environmental allergies.  Neurological: Negative.  Negative for headaches.  Hematological: Negative.  Negative for adenopathy. Does not bruise/bleed easily.  Psychiatric/Behavioral: Negative.        Objective:   Physical Exam  Constitutional: He is oriented to person, place, and time. He appears  well-developed and well-nourished.  Non-toxic appearance. He does not have a sickly appearance. He does not appear ill. No distress.  HENT:  Head: Normocephalic and atraumatic.  Mouth/Throat: Oropharynx is clear and moist. No oropharyngeal exudate.  Eyes: Conjunctivae are normal. Right eye exhibits no discharge. Left eye exhibits no discharge. No scleral icterus.  Neck: Normal range of motion. Neck supple. No JVD present. No tracheal deviation present. No thyromegaly present.  Cardiovascular: Normal rate, regular rhythm, normal heart sounds and intact distal pulses.  Exam reveals no gallop and no friction rub.   No murmur heard. Pulmonary/Chest: Effort normal and breath sounds normal. No stridor. No respiratory distress. He has no wheezes. He has no rales. He exhibits no tenderness.  Abdominal: Soft. Bowel sounds are normal. He exhibits no distension and no mass. There is no tenderness. There is no rebound and no guarding.  Musculoskeletal: Normal range of motion. He exhibits no edema or tenderness.  Lymphadenopathy:    He has no cervical adenopathy.  Neurological: He is oriented to person, place, and time.  Skin: Skin is warm and dry. No rash noted. He is not diaphoretic. No erythema. No pallor.  Vitals reviewed.   Lab Results  Component Value Date   WBC 5.2 09/10/2014   HGB 16.2 09/10/2014   HCT 44.9 09/10/2014   PLT 155 09/10/2014   GLUCOSE 119* 09/20/2014   CHOL 103 02/01/2014   TRIG 185.0* 02/01/2014   HDL 27.90* 02/01/2014   LDLDIRECT 56.3 10/16/2012   LDLCALC 38 02/01/2014   ALT 22 02/01/2014   AST 23 02/01/2014   NA 139 09/20/2014   K 4.5 09/20/2014   CL 102 09/20/2014   CREATININE 1.42* 09/20/2014   BUN 16 09/20/2014   CO2 34* 09/20/2014   TSH 1.74 02/01/2014  PSA 1.00 02/01/2014   INR 0.96 09/10/2014   HGBA1C 5.9 02/01/2014        Assessment & Plan:

## 2014-10-24 NOTE — Assessment & Plan Note (Signed)
Will treat the infection with avelox Will control the cough with hycodan Will check his CXR for severity and complications

## 2014-11-01 ENCOUNTER — Telehealth: Payer: Self-pay | Admitting: *Deleted

## 2014-11-01 DIAGNOSIS — J189 Pneumonia, unspecified organism: Secondary | ICD-10-CM

## 2014-11-01 MED ORDER — HYDROCODONE-HOMATROPINE 5-1.5 MG/5ML PO SYRP: 5.0000 mL | ORAL_SOLUTION | Freq: Three times a day (TID) | ORAL | Status: DC | PRN

## 2014-11-01 NOTE — Telephone Encounter (Signed)
Ok to ref cough syr OV next wk if not better Thx

## 2014-11-01 NOTE — Telephone Encounter (Signed)
Called pt spoke with wife gave md response. Printed script for hycodan will pick up...Jack Farmer

## 2014-11-01 NOTE — Telephone Encounter (Signed)
Left msg on triage stating pt saw md 10/23/04 cough is no better. He has 1 pill left on his antibiotic (avelox). Wanting to get a refill on the cough syrup, and any other recommendation about the cough. MD is out of office. Pls advise...Johny Chess

## 2014-11-05 DIAGNOSIS — M545 Low back pain: Secondary | ICD-10-CM | POA: Diagnosis not present

## 2015-01-09 DIAGNOSIS — H04123 Dry eye syndrome of bilateral lacrimal glands: Secondary | ICD-10-CM | POA: Diagnosis not present

## 2015-01-29 ENCOUNTER — Other Ambulatory Visit: Payer: Self-pay

## 2015-01-29 MED ORDER — LOSARTAN POTASSIUM-HCTZ 100-12.5 MG PO TABS: 1.0000 | ORAL_TABLET | Freq: Every day | ORAL | Status: DC

## 2015-02-04 DIAGNOSIS — M545 Low back pain: Secondary | ICD-10-CM | POA: Diagnosis not present

## 2015-02-04 DIAGNOSIS — I10 Essential (primary) hypertension: Secondary | ICD-10-CM | POA: Diagnosis not present

## 2015-02-04 DIAGNOSIS — Z6833 Body mass index (BMI) 33.0-33.9, adult: Secondary | ICD-10-CM | POA: Diagnosis not present

## 2015-04-29 ENCOUNTER — Other Ambulatory Visit: Payer: Self-pay

## 2015-04-29 NOTE — Telephone Encounter (Signed)
Approved      Disp Refills Start End    metoprolol (LOPRESSOR) 50 MG tablet 180 tablet 3 05/24/2014     Sig - Route:  Take 1 tablet (50 mg total) by mouth 2 (two) times daily. - Oral    Class:  Normal    Authorizing Provider:  Lelon Perla, MD    Ordering User:  Guinevere Ferrari      Visit Pharmacy     [id number] - [place], [address]

## 2015-05-06 DIAGNOSIS — M545 Low back pain: Secondary | ICD-10-CM | POA: Diagnosis not present

## 2015-05-23 ENCOUNTER — Other Ambulatory Visit: Payer: Self-pay

## 2015-05-23 MED ORDER — METOPROLOL TARTRATE 50 MG PO TABS: 50.0000 mg | ORAL_TABLET | Freq: Two times a day (BID) | ORAL | Status: DC

## 2015-10-09 ENCOUNTER — Other Ambulatory Visit: Payer: Self-pay | Admitting: Internal Medicine

## 2015-10-19 ENCOUNTER — Other Ambulatory Visit: Payer: Self-pay | Admitting: Internal Medicine

## 2015-10-25 ENCOUNTER — Other Ambulatory Visit: Payer: Self-pay | Admitting: Internal Medicine

## 2015-10-27 NOTE — Telephone Encounter (Signed)
Last lipid panel was June/2015---are you ok with refilling?

## 2015-10-29 ENCOUNTER — Other Ambulatory Visit: Payer: Self-pay | Admitting: Internal Medicine

## 2015-10-30 ENCOUNTER — Other Ambulatory Visit: Payer: Self-pay | Admitting: Internal Medicine

## 2015-11-22 ENCOUNTER — Other Ambulatory Visit: Payer: Self-pay | Admitting: Internal Medicine

## 2015-12-28 ENCOUNTER — Other Ambulatory Visit: Payer: Self-pay | Admitting: Internal Medicine

## 2015-12-29 ENCOUNTER — Other Ambulatory Visit: Payer: Self-pay | Admitting: Internal Medicine

## 2015-12-30 ENCOUNTER — Other Ambulatory Visit: Payer: Self-pay | Admitting: Internal Medicine

## 2015-12-31 ENCOUNTER — Other Ambulatory Visit: Payer: Self-pay | Admitting: *Deleted

## 2015-12-31 IMAGING — CR DG CHEST 2V
2 series · 2 of 2 positions shown · non-contrast
Comparison: 09/10/2014

CLINICAL DATA: Shortness of breath and dry cough for 3 days

EXAM:
CHEST  2 VIEW

[view not recorded (1 of 2)]
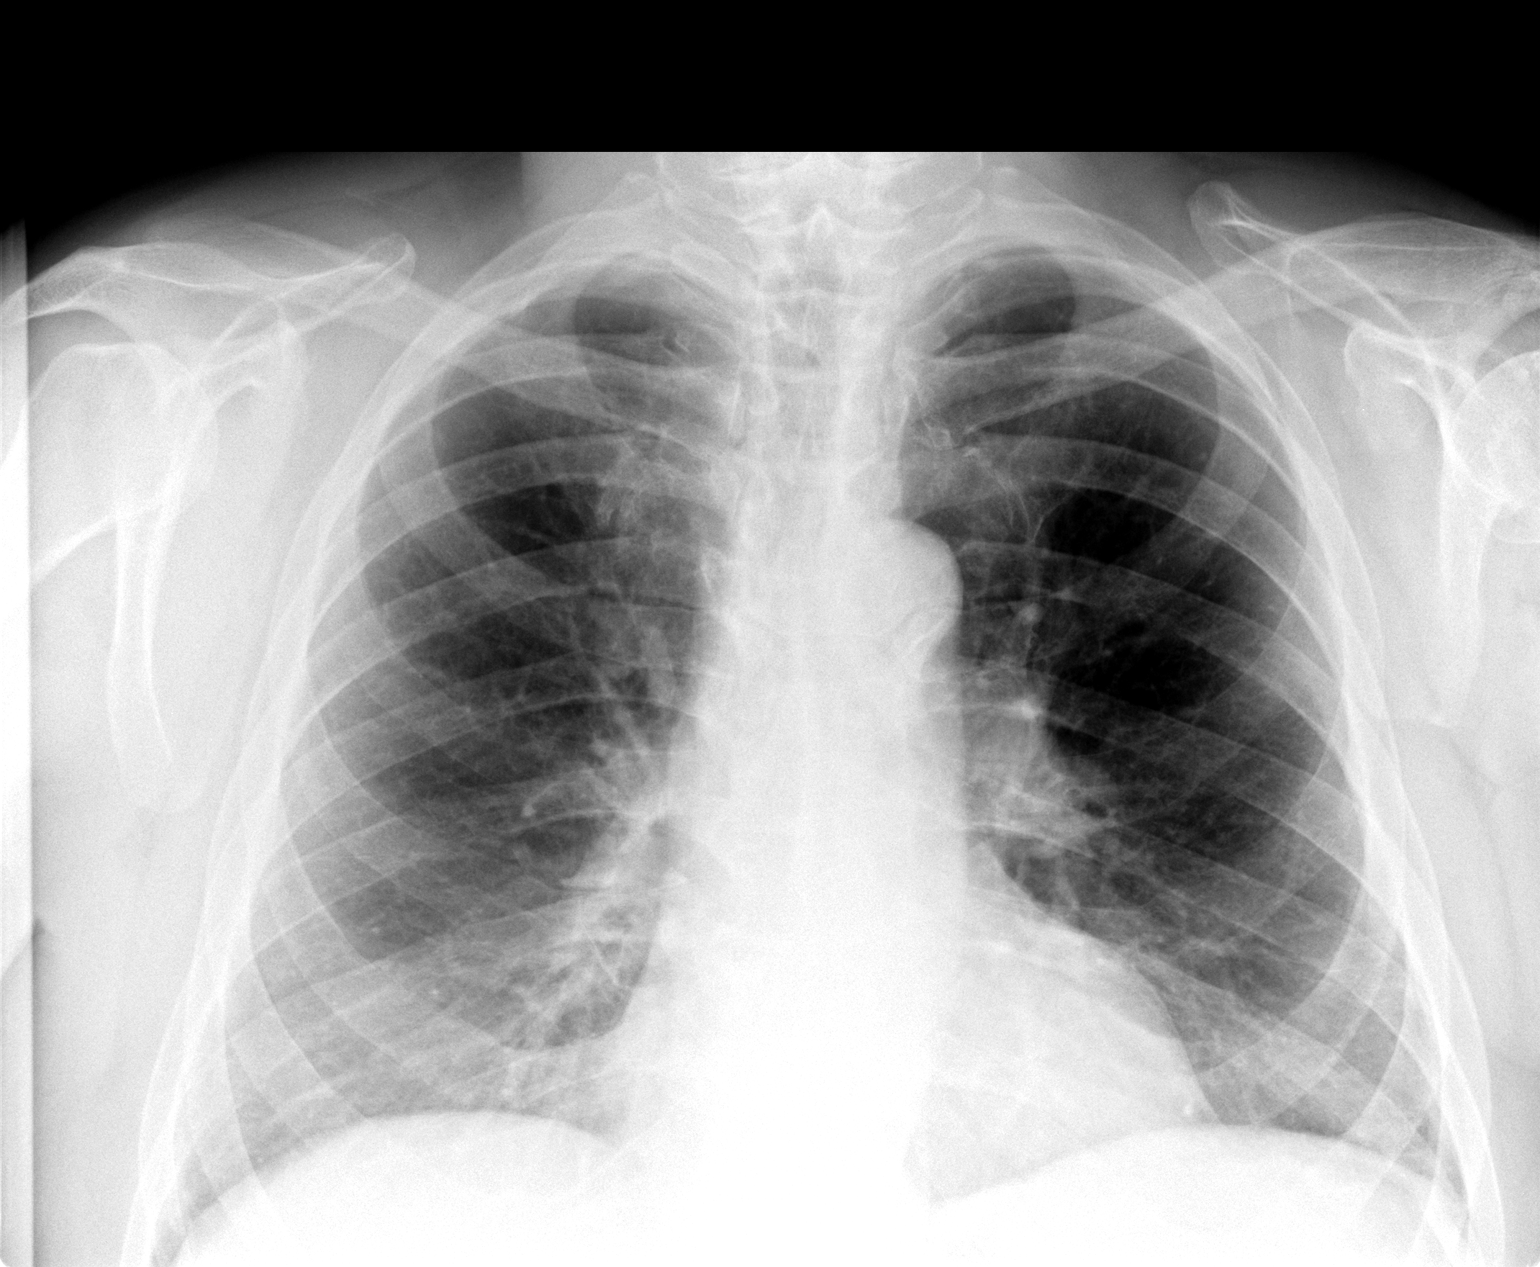

[view not recorded (2 of 2)]
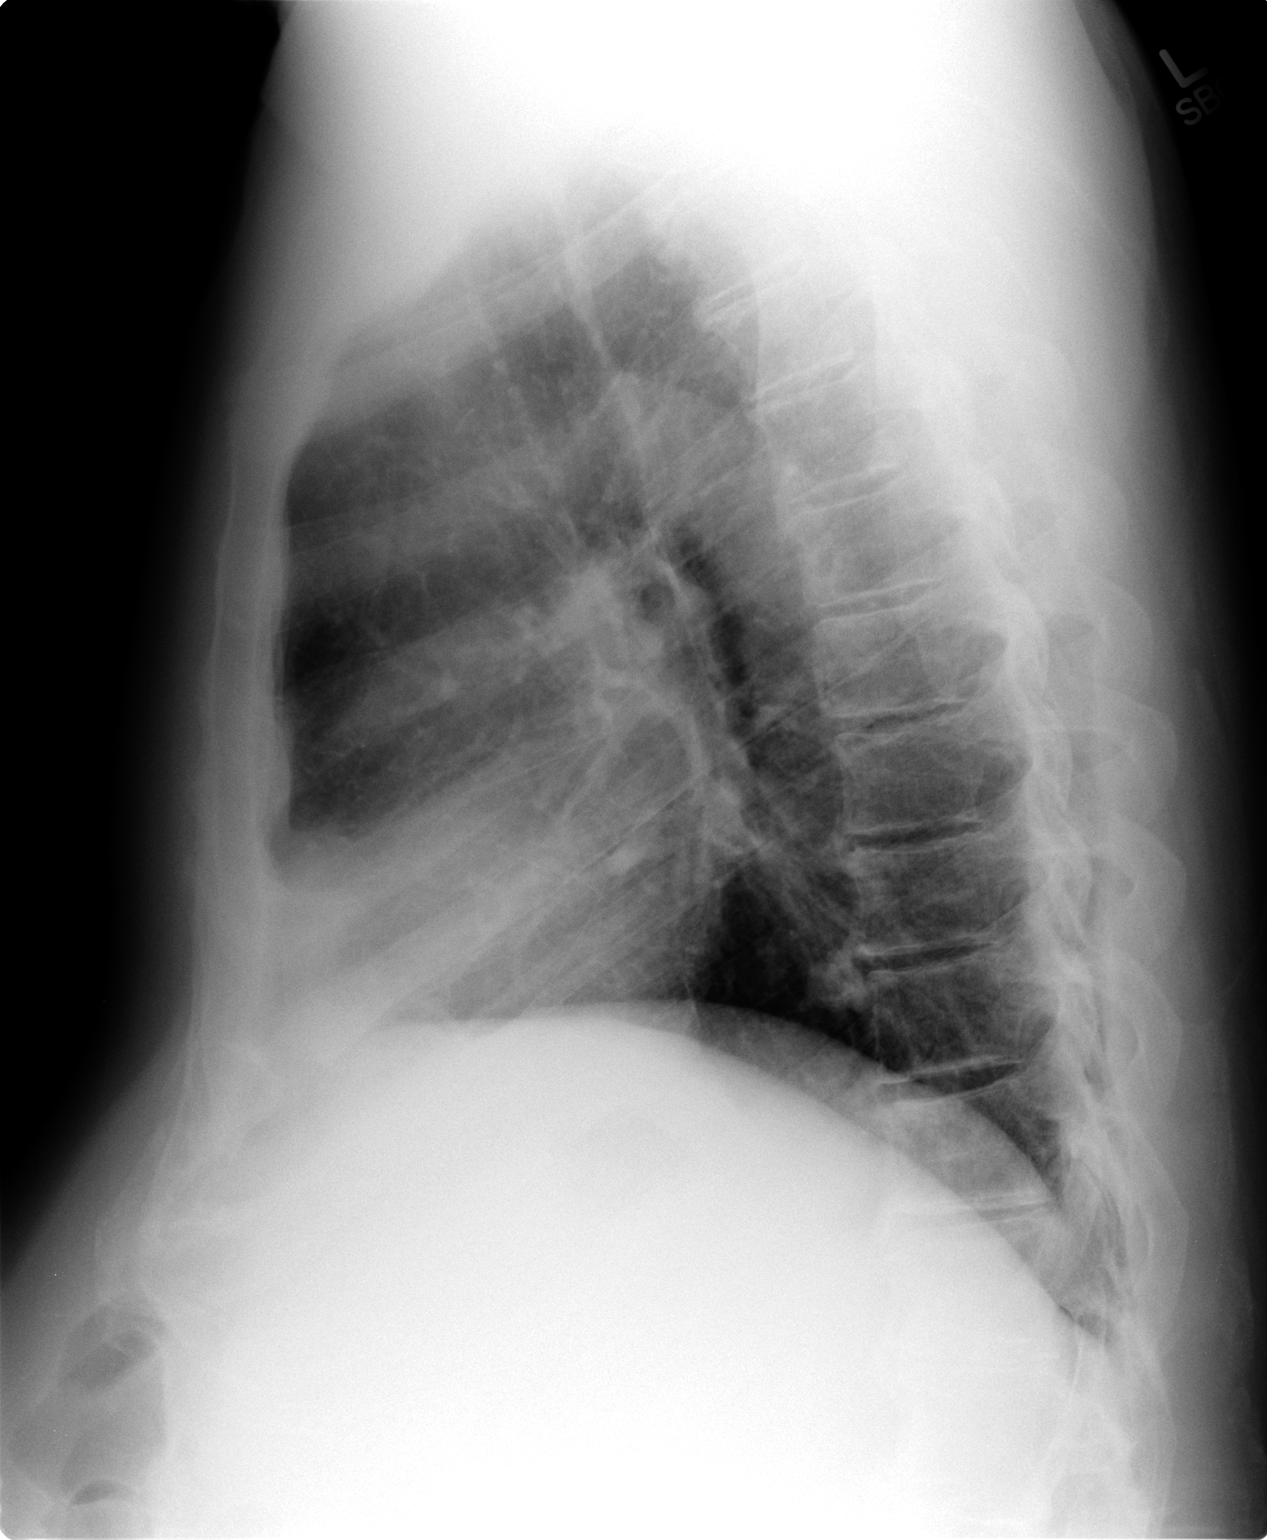

[2 of 2 positions shown; findings below may reference images not displayed]

FINDINGS: The heart size and mediastinal contours are within normal limits.
Both lungs are clear. The visualized skeletal structures are
unremarkable.
IMPRESSION: No active cardiopulmonary disease.

## 2015-12-31 MED ORDER — SIMVASTATIN 40 MG PO TABS: 40.0000 mg | ORAL_TABLET | Freq: Every evening | ORAL | Status: DC

## 2015-12-31 NOTE — Telephone Encounter (Signed)
Received call pt states he has made appt to see MD in June he is currently out of his simvastatin. [facility]ing [facility]. Sent 30 day until appt...Jack Farmer

## 2016-01-13 ENCOUNTER — Other Ambulatory Visit: Payer: Self-pay | Admitting: Internal Medicine

## 2016-01-15 ENCOUNTER — Other Ambulatory Visit: Payer: Self-pay | Admitting: Gastroenterology

## 2016-01-15 DIAGNOSIS — K5730 Diverticulosis of large intestine without perforation or abscess without bleeding: Secondary | ICD-10-CM | POA: Diagnosis not present

## 2016-01-15 DIAGNOSIS — K635 Polyp of colon: Secondary | ICD-10-CM | POA: Diagnosis not present

## 2016-01-15 DIAGNOSIS — Z86010 Personal history of colonic polyps: Secondary | ICD-10-CM | POA: Diagnosis not present

## 2016-01-15 DIAGNOSIS — K621 Rectal polyp: Secondary | ICD-10-CM | POA: Diagnosis not present

## 2016-01-15 DIAGNOSIS — D120 Benign neoplasm of cecum: Secondary | ICD-10-CM | POA: Diagnosis not present

## 2016-01-15 DIAGNOSIS — K5140 Inflammatory polyps of colon without complications: Secondary | ICD-10-CM | POA: Diagnosis not present

## 2016-01-15 LAB — HM COLONOSCOPY

## 2016-01-19 ENCOUNTER — Other Ambulatory Visit (INDEPENDENT_AMBULATORY_CARE_PROVIDER_SITE_OTHER): Payer: Medicare Other

## 2016-01-19 ENCOUNTER — Ambulatory Visit (INDEPENDENT_AMBULATORY_CARE_PROVIDER_SITE_OTHER): Payer: Medicare Other | Admitting: Internal Medicine

## 2016-01-19 ENCOUNTER — Encounter: Payer: Self-pay | Admitting: Internal Medicine

## 2016-01-19 VITALS — BP 130/80 | HR 81 | Temp 97.8°F | Resp 16 | Ht 74.0 in | Wt 245.0 lb

## 2016-01-19 DIAGNOSIS — R739 Hyperglycemia, unspecified: Secondary | ICD-10-CM | POA: Diagnosis not present

## 2016-01-19 DIAGNOSIS — Z0000 Encounter for general adult medical examination without abnormal findings: Secondary | ICD-10-CM

## 2016-01-19 DIAGNOSIS — Z23 Encounter for immunization: Secondary | ICD-10-CM | POA: Diagnosis not present

## 2016-01-19 DIAGNOSIS — I2510 Atherosclerotic heart disease of native coronary artery without angina pectoris: Secondary | ICD-10-CM

## 2016-01-19 DIAGNOSIS — I10 Essential (primary) hypertension: Secondary | ICD-10-CM | POA: Diagnosis not present

## 2016-01-19 DIAGNOSIS — I700 Atherosclerosis of aorta: Secondary | ICD-10-CM | POA: Diagnosis not present

## 2016-01-19 DIAGNOSIS — Z1159 Encounter for screening for other viral diseases: Secondary | ICD-10-CM

## 2016-01-19 DIAGNOSIS — E785 Hyperlipidemia, unspecified: Secondary | ICD-10-CM

## 2016-01-19 DIAGNOSIS — N400 Enlarged prostate without lower urinary tract symptoms: Secondary | ICD-10-CM

## 2016-01-19 LAB — COMPREHENSIVE METABOLIC PANEL
ALT: 27 U/L (ref 0–53)
AST: 22 U/L (ref 0–37)
Albumin: 4.6 g/dL (ref 3.5–5.2)
Alkaline Phosphatase: 88 U/L (ref 39–117)
BUN: 16 mg/dL (ref 6–23)
CALCIUM: 9.6 mg/dL (ref 8.4–10.5)
CHLORIDE: 100 meq/L (ref 96–112)
CO2: 32 meq/L (ref 19–32)
Creatinine, Ser: 1.25 mg/dL (ref 0.40–1.50)
GFR: 59.95 mL/min — AB (ref >60.00)
Glucose, Bld: 97 mg/dL (ref 70–99)
POTASSIUM: 4.6 meq/L (ref 3.5–5.1)
Sodium: 137 mEq/L (ref 135–145)
Total Bilirubin: 1 mg/dL (ref 0.2–1.2)
Total Protein: 6.9 g/dL (ref 6.0–8.3)

## 2016-01-19 LAB — LIPID PANEL
CHOL/HDL RATIO: 3
Cholesterol: 92 mg/dL (ref 0–200)
HDL: 27.6 mg/dL — AB (ref >39.00)
LDL CALC: 33 mg/dL (ref 0–99)
NonHDL: 64.5
TRIGLYCERIDES: 160 mg/dL — AB (ref 0.0–149.0)
VLDL: 32 mg/dL (ref 0.0–40.0)

## 2016-01-19 LAB — CBC WITH DIFFERENTIAL/PLATELET
BASOS PCT: 0.4 % (ref 0.0–3.0)
Basophils Absolute: 0 10*3/uL (ref 0.0–0.1)
EOS PCT: 2.4 % (ref 0.0–5.0)
Eosinophils Absolute: 0.2 10*3/uL (ref 0.0–0.7)
HEMATOCRIT: 48.3 % (ref 39.0–52.0)
HEMOGLOBIN: 16.7 g/dL (ref 13.0–17.0)
LYMPHS PCT: 27.9 % (ref 12.0–46.0)
Lymphs Abs: 1.8 10*3/uL (ref 0.7–4.0)
MCHC: 34.5 g/dL (ref 30.0–36.0)
MCV: 91.1 fl (ref 78.0–100.0)
MONOS PCT: 6.1 % (ref 3.0–12.0)
Monocytes Absolute: 0.4 10*3/uL (ref 0.1–1.0)
NEUTROS ABS: 4.1 10*3/uL (ref 1.4–7.7)
Neutrophils Relative %: 63.2 % (ref 43.0–77.0)
PLATELETS: 193 10*3/uL (ref 150.0–400.0)
RBC: 5.31 Mil/uL (ref 4.22–5.81)
RDW: 13.1 % (ref 11.5–15.5)
WBC: 6.5 10*3/uL (ref 4.0–10.5)

## 2016-01-19 LAB — FECAL OCCULT BLOOD, GUAIAC: Fecal Occult Blood: NEGATIVE

## 2016-01-19 LAB — URINALYSIS, ROUTINE W REFLEX MICROSCOPIC
BILIRUBIN URINE: NEGATIVE
Hgb urine dipstick: NEGATIVE
Leukocytes, UA: NEGATIVE
NITRITE: NEGATIVE
PH: 6 (ref 5.0–8.0)
RBC / HPF: NONE SEEN (ref 0–?)
SPECIFIC GRAVITY, URINE: 1.015 (ref 1.000–1.030)
Total Protein, Urine: 30 — AB
Urine Glucose: NEGATIVE
Urobilinogen, UA: 0.2 (ref 0.0–1.0)

## 2016-01-19 LAB — HEMOGLOBIN A1C: HEMOGLOBIN A1C: 5.8 % (ref 4.6–6.5)

## 2016-01-19 LAB — TSH: TSH: 1.42 u[IU]/mL (ref 0.35–4.50)

## 2016-01-19 LAB — HEPATITIS C ANTIBODY: HCV Ab: NEGATIVE

## 2016-01-19 LAB — PSA: PSA: 1.16 ng/mL (ref 0.10–4.00)

## 2016-01-19 MED ORDER — TAMSULOSIN HCL 0.4 MG PO CAPS: ORAL_CAPSULE | ORAL | Status: DC

## 2016-01-19 MED ORDER — LOSARTAN POTASSIUM-HCTZ 100-12.5 MG PO TABS: ORAL_TABLET | ORAL | Status: DC

## 2016-01-19 MED ORDER — SIMVASTATIN 40 MG PO TABS: 40.0000 mg | ORAL_TABLET | Freq: Every evening | ORAL | Status: DC

## 2016-01-19 NOTE — Patient Instructions (Signed)

## 2016-01-19 NOTE — Progress Notes (Signed)
Subjective:  Patient ID: Jack Farmer, male    DOB: [birth year]  Age: 75 y.o. MRN: [id number]  CC: Annual Exam; Hypertension; Hyperlipidemia; and Coronary Artery Disease   HPI Jack Farmer presents for a CPX.  He tells me that his blood pressure has been well controlled with a combination of an ARB and hydrochlorothiazide. He has been very active over the last 6 months and with dietary modification is been able to lose about 20 pounds. He denies episodes of headache/blurred vision/chest pain/shortness of breath/edema/palpitations/or fatigue.  He has had no recent episodes of chest pain or shortness of breath. He is tolerating his statin therapy well.    Past Medical History  Diagnosis Date  . Hypertension   . Hyperlipidemia   . Low HDL (under 40)   . CAD (coronary artery disease)   . Hypertrophy of prostate with urinary obstruction and other lower urinary tract symptoms (LUTS)   . Low back pain   . GERD (gastroesophageal reflux disease)   . Shortness of breath dyspnea     with ambulation  . Frequent urination   . Arthritis   . Skin cancer     removed from face   Past Surgical History  Procedure Laterality Date  . Anterior lat lumbar fusion  04/07/2012    Procedure: ANTERIOR LATERAL LUMBAR FUSION 2 LEVELS;  Surgeon: Eustace Moore, MD;  Location: [facility] [facility];  Service: Neurosurgery;  Laterality: Right;  Lumbar three-four and four-five extreme lumbar interbody fusion  . Tonsillectomy    . Eye surgery      tightened muscles in right eye  . Back surgery    . Colonoscopy w/ polypectomy    . Cardiac catheterization      reports that he has been smoking Cigarettes.  He has never used smokeless tobacco. He reports that he drinks alcohol. He reports that he does not use illicit drugs. family history includes Alcohol abuse in his other; Arthritis in his other; Drug abuse in his other; Hypertension in his other. Allergies  Allergen Reactions  . Cephalexin  Anaphylaxis  . Indocin [Indomethacin] Other (See Comments)     Gi bleed    Outpatient Prescriptions Prior to Visit  Medication Sig Dispense Refill  . amitriptyline (ELAVIL) 25 MG tablet Take 25 mg by mouth at bedtime.    . Ascorbic Acid (VITAMIN C) 1000 MG tablet Take 1,000 mg by mouth daily.    Marland Kitchen aspirin EC 81 MG tablet Take 81 mg by mouth daily.    Marland Kitchen b complex vitamins tablet Take 1 tablet by mouth daily.    . calcium carbonate (OS-CAL) 600 MG TABS Take 600 mg by mouth daily.    . Coenzyme Q10 (CO Q-10) 200 MG CAPS Take 200 mg by mouth daily.     . metoprolol (LOPRESSOR) 50 MG tablet Take 1 tablet (50 mg total) by mouth 2 (two) times daily. 180 tablet 3  . Omega-3 Fatty Acids (FISH OIL) 1000 MG CAPS Take 1,000 mg by mouth daily.    . tadalafil (CIALIS) 20 MG tablet Take 1 tablet (20 mg total) by mouth daily as needed for erectile dysfunction. 6 tablet 11  . tamsulosin (FLOMAX) 0.4 MG CAPS capsule TAKE 1 CAPSULE (0.4 MG TOTAL) BY MOUTH DAILY. OVERDUE FOR YEARLY PHYSICAL MUST SEE MD FOR REFILLS 30 capsule 0  . vitamin E 400 UNIT capsule Take 400 Units by mouth daily.    Marland Kitchen HYDROcodone-homatropine (HYCODAN) 5-1.5 MG/5ML syrup Take 5 mLs  by mouth every 8 (eight) hours as needed for cough. 120 mL 0  . losartan-hydrochlorothiazide (HYZAAR) 100-12.5 MG tablet TAKE 1 TABLET BY MOUTH DAILY. OVERDUE FOR YEARLY PHYSICAL W/LABS MUST SEE MD FOR REFILLS 30 tablet 0  . moxifloxacin (AVELOX) 400 MG tablet Take 1 tablet (400 mg total) by mouth daily. 10 tablet 0  . oxyCODONE-acetaminophen (PERCOCET/ROXICET) 5-325 MG per tablet Take 1-2 tablets by mouth every 4 (four) hours as needed for moderate pain. 90 tablet 0  . simvastatin (ZOCOR) 40 MG tablet Take 1 tablet (40 mg total) by mouth every evening. Must keep appt in June for future refills 30 tablet 0  . tamsulosin (FLOMAX) 0.4 MG CAPS capsule TAKE 1 CAPSULE (0.4 MG TOTAL) BY MOUTH DAILY. 30 capsule 2  . tiZANidine (ZANAFLEX) 4 MG tablet Take 1 tablet (4 mg  total) by mouth every 8 (eight) hours as needed for muscle spasms. 60 tablet 1   No facility-administered medications prior to visit.    ROS Review of Systems  Constitutional: Negative.  Negative for fever, chills, diaphoresis, appetite change and fatigue.  HENT: Negative.  Negative for sore throat and trouble swallowing.   Eyes: Negative.  Negative for visual disturbance.  Respiratory: Negative.  Negative for cough, choking, chest tightness, shortness of breath and stridor.   Cardiovascular: Negative.  Negative for chest pain, palpitations and leg swelling.  Gastrointestinal: Negative.  Negative for nausea, vomiting, abdominal pain, diarrhea, constipation and blood in stool.  Endocrine: Negative.   Genitourinary: Negative.  Negative for dysuria, urgency, hematuria, decreased urine volume, penile swelling, scrotal swelling and difficulty urinating.  Musculoskeletal: Negative.  Negative for myalgias, back pain, arthralgias and neck pain.  Skin: Negative.   Allergic/Immunologic: Negative.   Neurological: Negative.  Negative for dizziness, tremors, syncope, weakness, light-headedness, numbness and headaches.  Hematological: Negative.  Negative for adenopathy. Does not bruise/bleed easily.  Psychiatric/Behavioral: Negative.     Objective:  BP 130/80 mmHg  Pulse 81  Temp(Src) 97.8 F (36.6 C) (Oral)  Resp 16  Ht 6\' 2"  (1.88 m)  Wt 245 lb (111.131 kg)  BMI 31.44 kg/m2  SpO2 92%  BP Readings from Last 3 Encounters:  01/19/16 130/80  10/24/14 146/64  10/17/14 140/80    Wt Readings from Last 3 Encounters:  01/19/16 245 lb (111.131 kg)  10/24/14 261 lb (118.389 kg)  10/17/14 257 lb (116.574 kg)    Physical Exam  Constitutional: He is oriented to person, place, and time. He appears well-developed and well-nourished. No distress.  HENT:  Mouth/Throat: Oropharynx is clear and moist. No oropharyngeal exudate.  Eyes: Conjunctivae are normal. Right eye exhibits no discharge. Left  eye exhibits no discharge. No scleral icterus.  Neck: Normal range of motion. Neck supple. No JVD present. No tracheal deviation present. No thyromegaly present.  Cardiovascular: Normal rate, regular rhythm, normal heart sounds and intact distal pulses.  Exam reveals no gallop and no friction rub.   No murmur heard. Pulmonary/Chest: Effort normal and breath sounds normal. No stridor. No respiratory distress. He has no wheezes. He has no rales. He exhibits no tenderness.  Abdominal: Soft. Bowel sounds are normal. He exhibits no distension and no mass. There is no tenderness. There is no rebound and no guarding. Hernia confirmed negative in the right inguinal area and confirmed negative in the left inguinal area.  Genitourinary: Testes normal and penis normal. Rectal exam shows no external hemorrhoid, no internal hemorrhoid, no fissure, no mass, no tenderness and anal tone normal. Guaiac negative stool. Prostate  is enlarged (1+ smooth symm BPH). Prostate is not tender. Right testis shows no mass, no swelling and no tenderness. Right testis is descended. Left testis shows no mass, no swelling and no tenderness. Left testis is descended. Circumcised. No penile erythema or penile tenderness. No discharge found.  Musculoskeletal: Normal range of motion. He exhibits no edema or tenderness.  Lymphadenopathy:    He has no cervical adenopathy.       Right: No inguinal adenopathy present.       Left: No inguinal adenopathy present.  Neurological: He is oriented to person, place, and time.  Skin: Skin is warm and dry. No rash noted. He is not diaphoretic. No erythema. No pallor.  Psychiatric: He has a normal mood and affect. His behavior is normal. Judgment and thought content normal.  Vitals reviewed.   Lab Results  Component Value Date   WBC 6.5 01/19/2016   HGB 16.7 01/19/2016   HCT 48.3 01/19/2016   PLT 193.0 01/19/2016   GLUCOSE 97 01/19/2016   CHOL 92 01/19/2016   TRIG 160.0* 01/19/2016   HDL  27.60* 01/19/2016   LDLDIRECT 56.3 10/16/2012   LDLCALC 33 01/19/2016   ALT 27 01/19/2016   AST 22 01/19/2016   NA 137 01/19/2016   K 4.6 01/19/2016   CL 100 01/19/2016   CREATININE 1.25 01/19/2016   BUN 16 01/19/2016   CO2 32 01/19/2016   TSH 1.42 01/19/2016   PSA 1.16 01/19/2016   INR 0.96 09/10/2014   HGBA1C 5.8 01/19/2016    Dg Chest 2 View  10/24/2014  CLINICAL DATA:  Shortness of breath and dry cough for 3 days EXAM: CHEST  2 VIEW COMPARISON:  09/10/2014 FINDINGS: The heart size and mediastinal contours are within normal limits. Both lungs are clear. The visualized skeletal structures are unremarkable. IMPRESSION: No active cardiopulmonary disease. Electronically Signed   By: Inez Catalina M.D.   On: 10/24/2014 14:10    Assessment & Plan:   Aarin was seen today for annual exam, hypertension, hyperlipidemia and coronary artery disease.  Diagnoses and all orders for this visit:  Need for 23-polyvalent pneumococcal polysaccharide vaccine -     Pneumococcal polysaccharide vaccine 23-valent greater than or equal to 2yo subcutaneous/IM  BPH (benign prostatic hyperplasia)- His PSA remains low so I'm not concerned about prostate cancer, his symptoms are well controlled with the alpha-blocker, will continue -     tamsulosin (FLOMAX) 0.4 MG CAPS capsule; TAKE 1 CAPSULE (0.4 MG TOTAL) BY MOUTH DAILY. -     PSA; Future  Hyperglycemia- improvement noted with lifestyle modifications -     Comprehensive metabolic panel; Future -     Hemoglobin A1c; Future  Hyperlipidemia with target LDL less than 70- he has achieved his LDL goal is doing well on the statin therapy -     simvastatin (ZOCOR) 40 MG tablet; Take 1 tablet (40 mg total) by mouth every evening. -     Lipid panel; Future -     Comprehensive metabolic panel; Future -     TSH; Future  Essential hypertension- his blood pressures adequately well-controlled, electrolytes and renal function are stable. -      losartan-hydrochlorothiazide (HYZAAR) 100-12.5 MG tablet; TAKE 1 TABLET BY MOUTH DAILY. -     CBC with Differential/Platelet; Future -     Urinalysis, Routine w reflex microscopic (not at [facility]); Future  Atherosclerosis of aorta (HCC) -     simvastatin (ZOCOR) 40 MG tablet; Take 1 tablet (40 mg total) by  mouth every evening. -     Lipid panel; Future  Atherosclerosis of native coronary artery of native heart without angina pectoris- he has had no recent episodes of angina, will continue to modify his risk factors for cardiovascular events with blood pressure control, statin therapy, and aspirin therapy. -     simvastatin (ZOCOR) 40 MG tablet; Take 1 tablet (40 mg total) by mouth every evening. -     Lipid panel; Future  Need for hepatitis C screening test -     Hepatitis C antibody; Future  Routine general medical examination at a health care facility  Other orders -     Cancel: tamsulosin (FLOMAX) 0.4 MG CAPS capsule; TAKE 1 CAPSULE (0.4 MG TOTAL) BY MOUTH DAILY. -     Cancel: losartan-hydrochlorothiazide (HYZAAR) 100-12.5 MG tablet; TAKE 1 TABLET BY MOUTH DAILY. -     Cancel: simvastatin (ZOCOR) 40 MG tablet; Take 1 tablet (40 mg total) by mouth every evening.   I have discontinued [name]'s oxyCODONE-acetaminophen, tiZANidine, moxifloxacin, HYDROcodone-homatropine, and GAVILYTE-N WITH FLAVOR PACK. I have also changed his simvastatin and losartan-hydrochlorothiazide. Additionally, I am having him maintain his aspirin EC, Co Q-10, vitamin C, vitamin E, calcium carbonate, tadalafil, amitriptyline, b complex vitamins, Fish Oil, metoprolol, tamsulosin, and tamsulosin.  Meds ordered this encounter  Medications  . DISCONTD: GAVILYTE-N WITH FLAVOR PACK 420 g solution    Sig:   . simvastatin (ZOCOR) 40 MG tablet    Sig: Take 1 tablet (40 mg total) by mouth every evening.    Dispense:  90 tablet    Refill:  1  . tamsulosin (FLOMAX) 0.4 MG CAPS capsule    Sig: TAKE 1 CAPSULE (0.4 MG  TOTAL) BY MOUTH DAILY.    Dispense:  90 capsule    Refill:  2  . losartan-hydrochlorothiazide (HYZAAR) 100-12.5 MG tablet    Sig: TAKE 1 TABLET BY MOUTH DAILY.    Dispense:  90 tablet    Refill:  1   See AVS for instructions about healthy living and anticipatory guidance.  Follow-up: Return in about 6 months (around 07/20/2016).  Scarlette Calico, MD

## 2016-01-20 ENCOUNTER — Encounter: Payer: Self-pay | Admitting: Internal Medicine

## 2016-01-22 ENCOUNTER — Other Ambulatory Visit: Payer: Self-pay | Admitting: Internal Medicine

## 2016-04-06 DIAGNOSIS — H2513 Age-related nuclear cataract, bilateral: Secondary | ICD-10-CM | POA: Diagnosis not present

## 2016-05-13 ENCOUNTER — Ambulatory Visit: Payer: Medicare Other

## 2016-05-13 ENCOUNTER — Ambulatory Visit (INDEPENDENT_AMBULATORY_CARE_PROVIDER_SITE_OTHER): Payer: Medicare Other

## 2016-05-13 DIAGNOSIS — Z23 Encounter for immunization: Secondary | ICD-10-CM | POA: Diagnosis not present

## 2016-05-15 ENCOUNTER — Other Ambulatory Visit: Payer: Self-pay | Admitting: Cardiology

## 2016-05-17 NOTE — Telephone Encounter (Signed)
Rx request sent to pharmacy.  

## 2016-05-18 ENCOUNTER — Other Ambulatory Visit: Payer: Self-pay | Admitting: Cardiology

## 2016-05-18 NOTE — Telephone Encounter (Signed)
Rx request sent to pharmacy.  

## 2016-07-18 ENCOUNTER — Other Ambulatory Visit: Payer: Self-pay | Admitting: Internal Medicine

## 2016-07-18 DIAGNOSIS — I10 Essential (primary) hypertension: Secondary | ICD-10-CM

## 2016-07-21 ENCOUNTER — Other Ambulatory Visit: Payer: Self-pay | Admitting: Internal Medicine

## 2016-07-21 DIAGNOSIS — I10 Essential (primary) hypertension: Secondary | ICD-10-CM

## 2016-09-19 ENCOUNTER — Other Ambulatory Visit: Payer: Self-pay | Admitting: Internal Medicine

## 2016-09-19 DIAGNOSIS — I700 Atherosclerosis of aorta: Secondary | ICD-10-CM

## 2016-09-19 DIAGNOSIS — E785 Hyperlipidemia, unspecified: Secondary | ICD-10-CM

## 2016-09-19 DIAGNOSIS — I2510 Atherosclerotic heart disease of native coronary artery without angina pectoris: Secondary | ICD-10-CM

## 2016-11-04 ENCOUNTER — Other Ambulatory Visit: Payer: Self-pay | Admitting: Internal Medicine

## 2016-11-04 DIAGNOSIS — N400 Enlarged prostate without lower urinary tract symptoms: Secondary | ICD-10-CM

## 2016-11-15 ENCOUNTER — Ambulatory Visit: Payer: Medicare Other

## 2016-11-15 NOTE — Progress Notes (Unsigned)
Patient did not have AWV. Health coach saw that he had his last AWV  01/19/16 and therefore could not have the visit.

## 2016-11-22 ENCOUNTER — Encounter: Payer: Self-pay | Admitting: Internal Medicine

## 2016-11-22 ENCOUNTER — Other Ambulatory Visit (INDEPENDENT_AMBULATORY_CARE_PROVIDER_SITE_OTHER): Payer: Medicare Other

## 2016-11-22 ENCOUNTER — Ambulatory Visit (INDEPENDENT_AMBULATORY_CARE_PROVIDER_SITE_OTHER): Payer: Medicare Other | Admitting: Internal Medicine

## 2016-11-22 VITALS — BP 150/68 | HR 80 | Temp 98.2°F | Resp 16 | Ht 74.0 in | Wt 250.8 lb

## 2016-11-22 DIAGNOSIS — I2510 Atherosclerotic heart disease of native coronary artery without angina pectoris: Secondary | ICD-10-CM | POA: Diagnosis not present

## 2016-11-22 DIAGNOSIS — R9431 Abnormal electrocardiogram [ECG] [EKG]: Secondary | ICD-10-CM | POA: Diagnosis not present

## 2016-11-22 DIAGNOSIS — R072 Precordial pain: Secondary | ICD-10-CM

## 2016-11-22 DIAGNOSIS — I10 Essential (primary) hypertension: Secondary | ICD-10-CM

## 2016-11-22 DIAGNOSIS — F5101 Primary insomnia: Secondary | ICD-10-CM | POA: Diagnosis not present

## 2016-11-22 DIAGNOSIS — I200 Unstable angina: Secondary | ICD-10-CM | POA: Insufficient documentation

## 2016-11-22 HISTORY — DX: Primary insomnia: F51.01

## 2016-11-22 LAB — CARDIAC PANEL
CK MB: 2 ng/mL (ref 0.3–4.0)
CK TOTAL: 58 U/L (ref 7–232)
Relative Index: 3.5 calc — ABNORMAL HIGH (ref 0.0–2.5)

## 2016-11-22 LAB — URINALYSIS, ROUTINE W REFLEX MICROSCOPIC
BILIRUBIN URINE: NEGATIVE
Hgb urine dipstick: NEGATIVE
Leukocytes, UA: NEGATIVE
Nitrite: NEGATIVE
PH: 6 (ref 5.0–8.0)
RBC / HPF: NONE SEEN (ref 0–?)
SPECIFIC GRAVITY, URINE: 1.02 (ref 1.000–1.030)
Total Protein, Urine: 30 — AB
Urine Glucose: NEGATIVE
Urobilinogen, UA: 0.2 (ref 0.0–1.0)
WBC, UA: NONE SEEN (ref 0–?)

## 2016-11-22 LAB — BASIC METABOLIC PANEL
BUN: 21 mg/dL (ref 6–23)
CALCIUM: 9.4 mg/dL (ref 8.4–10.5)
CO2: 28 mEq/L (ref 19–32)
CREATININE: 1.28 mg/dL (ref 0.40–1.50)
Chloride: 103 mEq/L (ref 96–112)
GFR: 58.2 mL/min — AB (ref >60.00)
Glucose, Bld: 123 mg/dL — ABNORMAL HIGH (ref 70–99)
POTASSIUM: 4 meq/L (ref 3.5–5.1)
Sodium: 138 mEq/L (ref 135–145)

## 2016-11-22 LAB — TROPONIN I: TNIDX: 0.01 ug/L (ref 0.00–0.06)

## 2016-11-22 MED ORDER — ESZOPICLONE 2 MG PO TABS: 2.0000 mg | ORAL_TABLET | Freq: Every evening | ORAL | 3 refills | Status: DC | PRN

## 2016-11-22 MED ORDER — CHLORTHALIDONE 25 MG PO TABS: 25.0000 mg | ORAL_TABLET | Freq: Every day | ORAL | 1 refills | Status: DC

## 2016-11-22 MED ORDER — TELMISARTAN 40 MG PO TABS: 40.0000 mg | ORAL_TABLET | Freq: Every day | ORAL | 1 refills | Status: DC

## 2016-11-22 NOTE — Progress Notes (Signed)
Pre visit review using our clinic review tool, if applicable. No additional management support is needed unless otherwise documented below in the visit note. 

## 2016-11-22 NOTE — Patient Instructions (Signed)
Nonspecific Chest Pain °Chest pain can be caused by many different conditions. There is always a chance that your pain could be related to something serious, such as a heart attack or a blood clot in your lungs. Chest pain can also be caused by conditions that are not life-threatening. If you have chest pain, it is very important to follow up with your health care provider. °What are the causes? °Causes of this condition include: °· Heartburn. °· Pneumonia or bronchitis. °· Anxiety or stress. °· Inflammation around your heart (pericarditis) or lung (pleuritis or pleurisy). °· A blood clot in your lung. °· A collapsed lung (pneumothorax). This can develop suddenly on its own (spontaneous pneumothorax) or from trauma to the chest. °· Shingles infection (varicella-zoster virus). °· Heart attack. °· Damage to the bones, muscles, and cartilage that make up your chest wall. This can include: °? Bruised bones due to injury. °? Strained muscles or cartilage due to frequent or repeated coughing or overwork. °? Fracture to one or more ribs. °? Sore cartilage due to inflammation (costochondritis). ° °What increases the risk? °Risk factors for this condition may include: °· Activities that increase your risk for trauma or injury to your chest. °· Respiratory infections or conditions that cause frequent coughing. °· Medical conditions or overeating that can cause heartburn. °· Heart disease or family history of heart disease. °· Conditions or health behaviors that increase your risk of developing a blood clot. °· Having had chicken pox (varicella zoster). ° °What are the signs or symptoms? °Chest pain can feel like: °· Burning or tingling on the surface of your chest or deep in your chest. °· Crushing, pressure, aching, or squeezing pain. °· Dull or sharp pain that is worse when you move, cough, or take a deep breath. °· Pain that is also felt in your back, neck, shoulder, or arm, or pain that spreads to any of these  areas. ° °Your chest pain may come and go, or it may stay constant. °How is this diagnosed? °Lab tests or other studies may be needed to find the cause of your pain. Your health care provider may have you take a test called an ECG (electrocardiogram). An ECG records your heartbeat patterns at the time the test is performed. You may also have other tests, such as: °· Transthoracic echocardiogram (TTE). In this test, sound waves are used to create a picture of the heart structures and to look at how blood flows through your heart. °· Transesophageal echocardiogram (TEE). This is a more advanced imaging test that takes images from inside your body. It allows your health care provider to see your heart in finer detail. °· Cardiac monitoring. This allows your health care provider to monitor your heart rate and rhythm in real time. °· Holter monitor. This is a portable device that records your heartbeat and can help to diagnose abnormal heartbeats. It allows your health care provider to track your heart activity for several days, if needed. °· Stress tests. These can be done through exercise or by taking medicine that makes your heart beat more quickly. °· Blood tests. °· Other imaging tests. ° °How is this treated? °Treatment depends on what is causing your chest pain. Treatment may include: °· Medicines. These may include: °? Acid blockers for heartburn. °? Anti-inflammatory medicine. °? Pain medicine for inflammatory conditions. °? Antibiotic medicine, if an infection is present. °? Medicines to dissolve blood clots. °? Medicines to treat coronary artery disease (CAD). °· Supportive care for conditions that   do not require medicines. This may include: °? Resting. °? Applying heat or cold packs to injured areas. °? Limiting activities until pain decreases. ° °Follow these instructions at home: °Medicines °· If you were prescribed an antibiotic, take it as told by your health care provider. Do not stop taking the  antibiotic even if you start to feel better. °· Take over-the-counter and prescription medicines only as told by your health care provider. °Lifestyle °· Do not use any products that contain nicotine or tobacco, such as cigarettes and e-cigarettes. If you need help quitting, ask your health care provider. °· Do not drink alcohol. °· Make lifestyle changes as directed by your health care provider. These may include: °? Getting regular exercise. Ask your health care provider to suggest some activities that are safe for you. °? Eating a heart-healthy diet. A registered dietitian can help you to learn healthy eating options. °? Maintaining a healthy weight. °? Managing diabetes, if necessary. °? Reducing stress, such as with yoga or relaxation techniques. °General instructions °· Avoid any activities that bring on chest pain. °· If heartburn is the cause for your chest pain, raise (elevate) the head of your bed about 6 inches (15 cm) by putting blocks under the legs. Sleeping with more pillows does not effectively relieve heartburn because it only changes the position of your head. °· Keep all follow-up visits as told by your health care provider. This is important. This includes any further testing if your chest pain does not go away. °Contact a health care provider if: °· Your chest pain does not go away. °· You have a rash with blisters on your chest. °· You have a fever. °· You have chills. °Get help right away if: °· Your chest pain is worse. °· You have a cough that gets worse, or you cough up blood. °· You have severe pain in your abdomen. °· You have severe weakness. °· You faint. °· You have sudden, unexplained chest discomfort. °· You have sudden, unexplained discomfort in your arms, back, neck, or jaw. °· You have shortness of breath at any time. °· You suddenly start to sweat, or your skin gets clammy. °· You feel nauseous or you vomit. °· You suddenly feel light-headed or dizzy. °· Your heart begins to beat  quickly, or it feels like it is skipping beats. °These symptoms may represent a serious problem that is an emergency. Do not wait to see if the symptoms will go away. Get medical help right away. Call your local emergency services (911 in the [place] Do not drive yourself to the hospital. °This information is not intended to replace advice given to you by your health care provider. Make sure you discuss any questions you have with your health care provider. °Document Released: 05/12/2005 Document Revised: 04/26/2016 Document Reviewed: 04/26/2016 °Elsevier Interactive Patient Education © 2017 [employer] ° °

## 2016-11-22 NOTE — Progress Notes (Signed)
Subjective:  Patient ID: Jack Farmer, male    DOB: Dec 02, 1940  Age: 76 y.o. MRN: [id number]  CC: Hypertension   HPI Jack Farmer presents for concerns about an elevated blood pressure. One day prior to this visit his blood pressure at home was 198/78 and during that time he complained of a strange sensation in his chest that he describes as a dull ache. He did not experience any headache, blurred vision, shortness of breath, palpitations, diaphoresis, dizziness, or lightheadedness. He has a history of CAD but has never had a cardiac cath or thallium scan.  Outpatient Medications Prior to Visit  Medication Sig Dispense Refill  . Ascorbic Acid (VITAMIN C) 1000 MG tablet Take 1,000 mg by mouth daily.    Marland Kitchen aspirin EC 81 MG tablet Take 81 mg by mouth daily.    Marland Kitchen b complex vitamins tablet Take 1 tablet by mouth daily.    . metoprolol (LOPRESSOR) 50 MG tablet TAKE 1 TABLET (50 MG TOTAL) BY MOUTH 2 (TWO) TIMES DAILY. 180 tablet 3  . Omega-3 Fatty Acids (FISH OIL) 1000 MG CAPS Take 1,000 mg by mouth daily.    . simvastatin (ZOCOR) 40 MG tablet TAKE 1 TABLET (40 MG TOTAL) BY MOUTH EVERY EVENING. 90 tablet 1  . tamsulosin (FLOMAX) 0.4 MG CAPS capsule TAKE 1 CAPSULE (0.4 MG TOTAL) BY MOUTH DAILY. OVERDUE FOR YEARLY PHYSICAL MUST SEE MD FOR REFILLS 30 capsule 0  . vitamin E 400 UNIT capsule Take 400 Units by mouth daily.    . calcium carbonate (OS-CAL) 600 MG TABS Take 600 mg by mouth daily.    . Coenzyme Q10 (CO Q-10) 200 MG CAPS Take 200 mg by mouth daily.     . tadalafil (CIALIS) 20 MG tablet Take 1 tablet (20 mg total) by mouth daily as needed for erectile dysfunction. 6 tablet 11  . losartan-hydrochlorothiazide (HYZAAR) 100-12.5 MG tablet Take 1 tablet by mouth daily. 90 tablet 3  . losartan-hydrochlorothiazide (HYZAAR) 100-12.5 MG tablet TAKE 1 TABLET BY MOUTH DAILY. 90 tablet 1  . losartan-hydrochlorothiazide (HYZAAR) 100-12.5 MG tablet TAKE 1 TABLET BY MOUTH DAILY. 30 tablet 0    . tamsulosin (FLOMAX) 0.4 MG CAPS capsule Take 1 capsule (0.4 mg total) by mouth daily. Yearly physical due in June w/labs must see MD for refills 90 capsule 0   No facility-administered medications prior to visit.     ROS Review of Systems  Constitutional: Negative for activity change, appetite change, diaphoresis, fatigue and unexpected weight change.  HENT: Negative.  Negative for sinus pressure and trouble swallowing.   Eyes: Negative.  Negative for photophobia, pain, redness and visual disturbance.  Respiratory: Negative.  Negative for cough, chest tightness, shortness of breath and wheezing.   Cardiovascular: Positive for chest pain.  Gastrointestinal: Negative for abdominal pain, constipation, diarrhea, nausea and vomiting.  Endocrine: Negative.   Genitourinary: Negative.  Negative for difficulty urinating, dysuria, frequency, hematuria and urgency.  Musculoskeletal: Negative.  Negative for back pain, myalgias and neck pain.  Skin: Negative.   Allergic/Immunologic: Negative.   Neurological: Negative.  Negative for dizziness, tremors, syncope, weakness, light-headedness, numbness and headaches.  Hematological: Negative for adenopathy. Does not bruise/bleed easily.  Psychiatric/Behavioral: Positive for sleep disturbance. Negative for behavioral problems, decreased concentration and dysphoric mood. The patient is not nervous/anxious.        He complains of insomnia mostly with difficulty falling asleep but also some frequent awakenings    Objective:  BP (!) 150/68 (BP  Location: Left Arm, Patient Position: Sitting, Cuff Size: Normal)   Pulse 80   Temp 98.2 F (36.8 C) (Oral)   Resp 16   Ht 6\' 2"  (1.88 m)   Wt 250 lb 12 oz (113.7 kg)   SpO2 98%   BMI 32.19 kg/m   BP Readings from Last 3 Encounters:  11/22/16 (!) 150/68  01/19/16 130/80  10/24/14 (!) 146/64    Wt Readings from Last 3 Encounters:  11/22/16 250 lb 12 oz (113.7 kg)  01/19/16 245 lb (111.1 kg)  10/24/14  261 lb (118.4 kg)    Physical Exam  Constitutional: He is oriented to person, place, and time. No distress.  HENT:  Mouth/Throat: Oropharynx is clear and moist. No oropharyngeal exudate.  Eyes: Conjunctivae are normal. Right eye exhibits no discharge. Left eye exhibits no discharge. No scleral icterus.  Neck: Normal range of motion. Neck supple. No JVD present. No tracheal deviation present. No thyromegaly present.  Cardiovascular: Normal rate, regular rhythm, normal heart sounds and intact distal pulses.  Exam reveals no gallop and no friction rub.   No murmur heard. EKG --  Sinus  Rhythm  -Nonspecific ST depression   +   Nonspecific T-abnormality  -Nondiagnostic.   ABNORMAL   Pulmonary/Chest: Effort normal and breath sounds normal. No stridor. No respiratory distress. He has no wheezes. He has no rales. He exhibits no tenderness.  Abdominal: Soft. Bowel sounds are normal. He exhibits no distension and no mass. There is no tenderness. There is no rebound and no guarding.  Musculoskeletal: Normal range of motion. He exhibits no edema, tenderness or deformity.  Lymphadenopathy:    He has no cervical adenopathy.  Neurological: He is oriented to person, place, and time.  Skin: Skin is warm and dry. No rash noted. He is not diaphoretic. No erythema. No pallor.  Vitals reviewed.   Lab Results  Component Value Date   WBC 6.5 01/19/2016   HGB 16.7 01/19/2016   HCT 48.3 01/19/2016   PLT 193.0 01/19/2016   GLUCOSE 123 (H) 11/22/2016   CHOL 92 01/19/2016   TRIG 160.0 (H) 01/19/2016   HDL 27.60 (L) 01/19/2016   LDLDIRECT 56.3 10/16/2012   LDLCALC 33 01/19/2016   ALT 27 01/19/2016   AST 22 01/19/2016   NA 138 11/22/2016   K 4.0 11/22/2016   CL 103 11/22/2016   CREATININE 1.28 11/22/2016   BUN 21 11/22/2016   CO2 28 11/22/2016   TSH 1.42 01/19/2016   PSA 1.16 01/19/2016   INR 0.96 09/10/2014   HGBA1C 5.8 01/19/2016    Dg Chest 2 View  Result Date: 10/24/2014 CLINICAL DATA:   Shortness of breath and dry cough for 3 days EXAM: CHEST  2 VIEW COMPARISON:  09/10/2014 FINDINGS: The heart size and mediastinal contours are within normal limits. Both lungs are clear. The visualized skeletal structures are unremarkable. IMPRESSION: No active cardiopulmonary disease. Electronically Signed   By: Inez Catalina M.D.   On: 10/24/2014 14:10    Assessment & Plan:   Kemal was seen today for hypertension.  Diagnoses and all orders for this visit:  Essential hypertension- his blood pressure is not adequately well controlled so I've asked him to upgrade to a more potent ARB and thiazide diuretic, his electrolytes and renal function are normal today. -     Basic metabolic panel; Future -     Urinalysis, Routine w reflex microscopic; Future -     chlorthalidone (HYGROTON) 25 MG tablet; Take 1 tablet (25  mg total) by mouth daily. -     telmisartan (MICARDIS) 40 MG tablet; Take 1 tablet (40 mg total) by mouth daily.  Atherosclerosis of native coronary artery of native heart without angina pectoris- he sat of vague episode of chest pain but today his troponin and cardiac panel are negative for ischemia, he has nonspecific changes on his EKGs have asked him to undergo a myocardial perfusion image to see if his symptoms are related to ischemia -     Troponin I; Future -     Cardiac panel; Future -     telmisartan (MICARDIS) 40 MG tablet; Take 1 tablet (40 mg total) by mouth daily. -     Myocardial Perfusion Imaging; Future  Precordial chest pain- as above -     Troponin I; Future -     Cardiac panel; Future -     EKG 12-Lead -     EKG 12-Lead -     Myocardial Perfusion Imaging; Future  Primary insomnia -     eszopiclone (LUNESTA) 2 MG TABS tablet; Take 1 tablet (2 mg total) by mouth at bedtime as needed for sleep. Take immediately before bedtime  Abnormal electrocardiogram (ECG) (EKG)- as above -     Myocardial Perfusion Imaging; Future   I have discontinued [name]'s Co  Q-10, calcium carbonate, tadalafil, losartan-hydrochlorothiazide, losartan-hydrochlorothiazide, losartan-hydrochlorothiazide, and losartan-hydrochlorothiazide. I am also having him start on eszopiclone, chlorthalidone, and telmisartan. Additionally, I am having him maintain his aspirin EC, vitamin C, vitamin E, b complex vitamins, Fish Oil, tamsulosin, metoprolol, and simvastatin.  Meds ordered this encounter  Medications  . DISCONTD: losartan-hydrochlorothiazide (HYZAAR) 100-12.5 MG tablet    Sig: 1 tablet daily.    Refill:  1  . eszopiclone (LUNESTA) 2 MG TABS tablet    Sig: Take 1 tablet (2 mg total) by mouth at bedtime as needed for sleep. Take immediately before bedtime    Dispense:  30 tablet    Refill:  3  . chlorthalidone (HYGROTON) 25 MG tablet    Sig: Take 1 tablet (25 mg total) by mouth daily.    Dispense:  90 tablet    Refill:  1  . telmisartan (MICARDIS) 40 MG tablet    Sig: Take 1 tablet (40 mg total) by mouth daily.    Dispense:  90 tablet    Refill:  1     Follow-up: Return in about 3 weeks (around 12/13/2016).  Scarlette Calico, MD

## 2016-11-24 ENCOUNTER — Telehealth (HOSPITAL_COMMUNITY): Payer: Self-pay | Admitting: Internal Medicine

## 2016-11-24 NOTE — Telephone Encounter (Signed)
I contacted Jack Farmer to get his scheduled for a Myoview and he voiced that he was not aware the Dr. Ronnald Ramp wanted him to have this done and he also stated that he had a Nuclear stress test a couple of years ago and did not want another one. He will be removed from the workqueue.

## 2017-01-05 ENCOUNTER — Ambulatory Visit (INDEPENDENT_AMBULATORY_CARE_PROVIDER_SITE_OTHER): Payer: Medicare Other | Admitting: Internal Medicine

## 2017-01-05 ENCOUNTER — Encounter: Payer: Self-pay | Admitting: Internal Medicine

## 2017-01-05 ENCOUNTER — Other Ambulatory Visit (INDEPENDENT_AMBULATORY_CARE_PROVIDER_SITE_OTHER): Payer: Medicare Other

## 2017-01-05 VITALS — BP 152/90 | HR 85 | Temp 98.0°F | Resp 16 | Ht 74.0 in | Wt 248.4 lb

## 2017-01-05 DIAGNOSIS — I2510 Atherosclerotic heart disease of native coronary artery without angina pectoris: Secondary | ICD-10-CM

## 2017-01-05 DIAGNOSIS — E785 Hyperlipidemia, unspecified: Secondary | ICD-10-CM

## 2017-01-05 DIAGNOSIS — I10 Essential (primary) hypertension: Secondary | ICD-10-CM

## 2017-01-05 LAB — BASIC METABOLIC PANEL
BUN: 20 mg/dL (ref 6–23)
CHLORIDE: 97 meq/L (ref 96–112)
CO2: 32 meq/L (ref 19–32)
Calcium: 9.8 mg/dL (ref 8.4–10.5)
Creatinine, Ser: 1.31 mg/dL (ref 0.40–1.50)
GFR: 56.65 mL/min — ABNORMAL LOW (ref >60.00)
Glucose, Bld: 131 mg/dL — ABNORMAL HIGH (ref 70–99)
Potassium: 4.1 mEq/L (ref 3.5–5.1)
SODIUM: 134 meq/L — AB (ref 135–145)

## 2017-01-05 LAB — LIPID PANEL
CHOL/HDL RATIO: 4
Cholesterol: 94 mg/dL (ref 0–200)
HDL: 26 mg/dL — AB (ref >39.00)
NONHDL: 67.54
TRIGLYCERIDES: 269 mg/dL — AB (ref 0.0–149.0)
VLDL: 53.8 mg/dL — ABNORMAL HIGH (ref 0.0–40.0)

## 2017-01-05 LAB — LDL CHOLESTEROL, DIRECT: Direct LDL: 46 mg/dL

## 2017-01-05 MED ORDER — AMLODIPINE BESYLATE 5 MG PO TABS: 5.0000 mg | ORAL_TABLET | Freq: Every day | ORAL | 1 refills | Status: DC

## 2017-01-05 NOTE — Progress Notes (Signed)
Subjective:  Patient ID: Jack Farmer, male    DOB: 1940-10-09  Age: 76 y.o. MRN: [id number]  CC: Hypertension and Hyperlipidemia   HPI Jack Farmer presents for concerns about his blood pressure. He has been monitoring his blood pressure at home for over a month and has had elevations s high as 180/80 but his blood pressure is consistently in the 160-175 range over 68-78. He is compliant with the thiazide diuretic, ARB, and beta blocker. He's had no recent episodes of headache/blurred vision/chest pain/shortness of breath/palpitations/edema/fatigue.  Outpatient Medications Prior to Visit  Medication Sig Dispense Refill  . Ascorbic Acid (VITAMIN C) 1000 MG tablet Take 1,000 mg by mouth daily.    Marland Kitchen aspirin EC 81 MG tablet Take 81 mg by mouth daily.    Marland Kitchen b complex vitamins tablet Take 1 tablet by mouth daily.    . chlorthalidone (HYGROTON) 25 MG tablet Take 1 tablet (25 mg total) by mouth daily. 90 tablet 1  . metoprolol (LOPRESSOR) 50 MG tablet TAKE 1 TABLET (50 MG TOTAL) BY MOUTH 2 (TWO) TIMES DAILY. 180 tablet 3  . Omega-3 Fatty Acids (FISH OIL) 1000 MG CAPS Take 1,000 mg by mouth daily.    . simvastatin (ZOCOR) 40 MG tablet TAKE 1 TABLET (40 MG TOTAL) BY MOUTH EVERY EVENING. 90 tablet 1  . tamsulosin (FLOMAX) 0.4 MG CAPS capsule TAKE 1 CAPSULE (0.4 MG TOTAL) BY MOUTH DAILY. OVERDUE FOR YEARLY PHYSICAL MUST SEE MD FOR REFILLS 30 capsule 0  . telmisartan (MICARDIS) 40 MG tablet Take 1 tablet (40 mg total) by mouth daily. 90 tablet 1  . vitamin E 400 UNIT capsule Take 400 Units by mouth daily.    . eszopiclone (LUNESTA) 2 MG TABS tablet Take 1 tablet (2 mg total) by mouth at bedtime as needed for sleep. Take immediately before bedtime 30 tablet 3   No facility-administered medications prior to visit.     ROS Review of Systems  Constitutional: Negative.  Negative for appetite change, diaphoresis, fatigue and unexpected weight change.  HENT: Negative.   Eyes: Negative for  visual disturbance.  Respiratory: Negative for cough, chest tightness, shortness of breath and wheezing.   Cardiovascular: Negative.  Negative for chest pain, palpitations and leg swelling.  Gastrointestinal: Negative for abdominal pain, constipation, diarrhea, nausea and vomiting.  Endocrine: Negative.   Genitourinary: Negative.  Negative for difficulty urinating, dysuria, hematuria and urgency.  Musculoskeletal: Negative.  Negative for back pain and neck pain.  Skin: Negative.  Negative for color change and rash.  Neurological: Negative.  Negative for dizziness, weakness, numbness and headaches.  Hematological: Negative for adenopathy. Does not bruise/bleed easily.  Psychiatric/Behavioral: Negative.     Objective:  BP (!) 152/90 (BP Location: Left Arm, Patient Position: Sitting, Cuff Size: Large)   Pulse 85   Temp 98 F (36.7 C) (Oral)   Resp 16   Ht 6\' 2"  (1.88 m)   Wt 248 lb 6 oz (112.7 kg)   SpO2 97%   BMI 31.89 kg/m   BP Readings from Last 3 Encounters:  01/05/17 (!) 152/90  11/22/16 (!) 150/68  01/19/16 130/80    Wt Readings from Last 3 Encounters:  01/05/17 248 lb 6 oz (112.7 kg)  11/22/16 250 lb 12 oz (113.7 kg)  01/19/16 245 lb (111.1 kg)    Physical Exam  Constitutional: He is oriented to person, place, and time. No distress.  HENT:  Mouth/Throat: Oropharynx is clear and moist. No oropharyngeal exudate.  Eyes: Conjunctivae are normal. Right eye exhibits no discharge. Left eye exhibits no discharge. No scleral icterus.  Neck: Normal range of motion. Neck supple. No JVD present. No tracheal deviation present. No thyromegaly present.  Cardiovascular: Normal rate, regular rhythm, normal heart sounds and intact distal pulses.  Exam reveals no gallop and no friction rub.   No murmur heard. Pulmonary/Chest: Effort normal and breath sounds normal. No stridor. No respiratory distress. He has no wheezes. He has no rales. He exhibits no tenderness.  Abdominal: Soft.  Bowel sounds are normal. He exhibits no distension and no mass. There is no tenderness. There is no rebound and no guarding.  Musculoskeletal: Normal range of motion. He exhibits no edema, tenderness or deformity.  Lymphadenopathy:    He has no cervical adenopathy.  Neurological: He is oriented to person, place, and time.  Skin: Skin is warm and dry. No rash noted. He is not diaphoretic. No erythema. No pallor.  Vitals reviewed.   Lab Results  Component Value Date   WBC 6.5 01/19/2016   HGB 16.7 01/19/2016   HCT 48.3 01/19/2016   PLT 193.0 01/19/2016   GLUCOSE 131 (H) 01/05/2017   CHOL 94 01/05/2017   TRIG 269.0 (H) 01/05/2017   HDL 26.00 (L) 01/05/2017   LDLDIRECT 46.0 01/05/2017   LDLCALC 33 01/19/2016   ALT 27 01/19/2016   AST 22 01/19/2016   NA 134 (L) 01/05/2017   K 4.1 01/05/2017   CL 97 01/05/2017   CREATININE 1.31 01/05/2017   BUN 20 01/05/2017   CO2 32 01/05/2017   TSH 1.42 01/19/2016   PSA 1.16 01/19/2016   INR 0.96 09/10/2014   HGBA1C 5.8 01/19/2016    Dg Chest 2 View  Result Date: 10/24/2014 CLINICAL DATA:  Shortness of breath and dry cough for 3 days EXAM: CHEST  2 VIEW COMPARISON:  09/10/2014 FINDINGS: The heart size and mediastinal contours are within normal limits. Both lungs are clear. The visualized skeletal structures are unremarkable. IMPRESSION: No active cardiopulmonary disease. Electronically Signed   By: Jack Farmer M.D.   On: 10/24/2014 14:10    Assessment & Plan:   Jack Farmer was seen today for hypertension and hyperlipidemia.  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  Hyperlipidemia with target LDL less than 70- he has achieved his LDL goal and is doing well on the statin -     Lipid panel; Future   I have discontinued [name]'s eszopiclone. I am also having him start on amLODipine. Additionally, I am having him maintain his aspirin EC, vitamin C, vitamin E, b complex vitamins, Fish Oil, tamsulosin, metoprolol tartrate, simvastatin, chlorthalidone, and telmisartan.  Meds ordered this encounter  Medications  . amLODipine (NORVASC) 5 MG tablet    Sig: Take 1 tablet (5 mg total) by mouth daily.    Dispense:  90 tablet    Refill:  1     Follow-up: Return in about 4 months (around 05/08/2017).  Scarlette Calico, MD

## 2017-01-05 NOTE — Patient Instructions (Signed)

## 2017-01-17 ENCOUNTER — Other Ambulatory Visit: Payer: Self-pay | Admitting: Internal Medicine

## 2017-01-17 DIAGNOSIS — I10 Essential (primary) hypertension: Secondary | ICD-10-CM

## 2017-01-31 ENCOUNTER — Other Ambulatory Visit: Payer: Self-pay | Admitting: Internal Medicine

## 2017-01-31 DIAGNOSIS — N400 Enlarged prostate without lower urinary tract symptoms: Secondary | ICD-10-CM

## 2017-02-08 ENCOUNTER — Other Ambulatory Visit: Payer: Self-pay | Admitting: Internal Medicine

## 2017-02-08 DIAGNOSIS — N400 Enlarged prostate without lower urinary tract symptoms: Secondary | ICD-10-CM

## 2017-04-08 ENCOUNTER — Ambulatory Visit (INDEPENDENT_AMBULATORY_CARE_PROVIDER_SITE_OTHER): Payer: Medicare Other | Admitting: *Deleted

## 2017-04-08 VITALS — BP 178/92 | HR 84 | Resp 20 | Ht 74.0 in | Wt 253.0 lb

## 2017-04-08 DIAGNOSIS — Z0000 Encounter for general adult medical examination without abnormal findings: Secondary | ICD-10-CM | POA: Diagnosis not present

## 2017-04-08 DIAGNOSIS — Z23 Encounter for immunization: Secondary | ICD-10-CM

## 2017-04-08 NOTE — Progress Notes (Addendum)
Subjective:   Jack Farmer is a 76 y.o. male who presents for Medicare Annual/Subsequent preventive examination.  Patient c/o elevated B/P at home, ongoing fatigue, SOB with exertion, and being diaphoretic.  Patient stated he denied stress test that was ordered per PCP during last visit when cardiology called to schedule.  Review of Systems:  No ROS.  Medicare Wellness Visit. Additional risk factors are reflected in the social history.  Cardiac Risk Factors include: advanced age (>51men, >48 women);hypertension;male gender Sleep patterns: gets up 1-2 times nightly to void and sleeps 4-6 hours nightly. Patient reports insomnia issues, discussed recommended sleep tips and stress reduction tips, education was attached to patient's AVS.  Home Safety/Smoke Alarms: Feels safe in home. Smoke alarms in place.  Living environment; residence and Adult nurse: apartment, equipment: Cane, Type: [place], no firearms. Lives with wife, no needs for DME Seat Belt Safety/Bike Helmet: Wears seat belt.     Objective:    Vitals: BP (!) 178/92   Pulse 84   Resp 20   Ht 6\' 2"  (1.88 m)   Wt 253 lb (114.8 kg)   SpO2 98%   BMI 32.48 kg/m   Body mass index is 32.48 kg/m.  Tobacco History  Smoking Status  . Current Some Day Smoker  . Types: Cigarettes  . Last attempt to quit: 08/16/2006  Smokeless Tobacco  . Never Used     Ready to quit: Not Answered Counseling given: Not Answered   Past Medical History:  Diagnosis Date  . Arthritis   . CAD (coronary artery disease)   . Frequent urination   . GERD (gastroesophageal reflux disease)   . Hyperlipidemia   . Hypertension   . Hypertrophy of prostate with urinary obstruction and other lower urinary tract symptoms (LUTS)   . Low back pain   . Low HDL (under 40)   . Shortness of breath dyspnea    with ambulation  . Skin cancer    removed from face   Past Surgical History:  Procedure Laterality Date  . ANTERIOR LAT LUMBAR  FUSION  04/07/2012   Procedure: ANTERIOR LATERAL LUMBAR FUSION 2 LEVELS;  Surgeon: Eustace Moore, MD;  Location: [facility];  Service: Neurosurgery;  Laterality: Right;  Lumbar three-four and four-five extreme lumbar interbody fusion  . BACK SURGERY    . CARDIAC CATHETERIZATION    . COLONOSCOPY W/ POLYPECTOMY    . EYE SURGERY     tightened muscles in right eye  . TONSILLECTOMY     Family History  Problem Relation Age of Onset  . Alcohol abuse Other   . Drug abuse Other   . Arthritis Other   . Hypertension Other    History  Sexual Activity  . Sexual activity: Yes    Outpatient Encounter Prescriptions as of 04/08/2017  Medication Sig  . amLODipine (NORVASC) 5 MG tablet Take 1 tablet (5 mg total) by mouth daily.  . Ascorbic Acid (VITAMIN C) 1000 MG tablet Take 1,000 mg by mouth daily.  Marland Kitchen aspirin EC 81 MG tablet Take 81 mg by mouth daily.  Marland Kitchen b complex vitamins tablet Take 1 tablet by mouth daily.  . chlorthalidone (HYGROTON) 25 MG tablet Take 1 tablet (25 mg total) by mouth daily.  Marland Kitchen losartan-hydrochlorothiazide (HYZAAR) 100-12.5 MG tablet TAKE 1 TABLET EVERY DAY  . metoprolol (LOPRESSOR) 50 MG tablet TAKE 1 TABLET (50 MG TOTAL) BY MOUTH 2 (TWO) TIMES DAILY.  Marland Kitchen Omega-3 Fatty Acids (FISH OIL) 1000 MG  CAPS Take 1,000 mg by mouth daily.  . simvastatin (ZOCOR) 40 MG tablet TAKE 1 TABLET (40 MG TOTAL) BY MOUTH EVERY EVENING.  . tamsulosin (FLOMAX) 0.4 MG CAPS capsule TAKE 1 CAPSULE (0.4 MG TOTAL) BY MOUTH DAILY. OVERDUE FOR YEARLY PHYSICAL MUST SEE MD FOR REFILLS  . telmisartan (MICARDIS) 40 MG tablet Take 1 tablet (40 mg total) by mouth daily.  . vitamin E 400 UNIT capsule Take 400 Units by mouth daily.  . [DISCONTINUED] tamsulosin (FLOMAX) 0.4 MG CAPS capsule TAKE ONE CAPSULE BY MOUTH EVERY DAY *NEEDS APPT  . [DISCONTINUED] tamsulosin (FLOMAX) 0.4 MG CAPS capsule TAKE ONE CAPSULE BY MOUTH EVERY DAY *NEEDS APPT   No facility-administered encounter medications on file as of 04/08/2017.       Activities of Daily Living In your present state of health, do you have any difficulty performing the following activities: 04/08/2017  Hearing? N  Vision? N  Difficulty concentrating or making decisions? N  Walking or climbing stairs? Y  Dressing or bathing? N  Doing errands, shopping? Y  Preparing Food and eating ? N  Using the Toilet? N  In the past six months, have you accidently leaked urine? N  Do you have problems with loss of bowel control? N  Managing your Medications? N  Managing your Finances? N  Housekeeping or managing your Housekeeping? N  Some recent data might be hidden    Patient Care Team: Janith Lima, MD as PCP - General   Assessment:    Physical assessment deferred to PCP.  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 Stay as healthy as possible so that I can take care of my wife, enjoy life, family and worship God      Fall Risk Fall Risk  04/08/2017 01/19/2016 01/19/2016 06/19/2013  Falls in the past year? No No No No   Depression Screen PHQ 2/9 Scores 04/08/2017 01/19/2016 01/19/2016 06/19/2013  PHQ - 2 Score 0 0 0 0  PHQ- 9 Score 6 - - -    Cognitive Function       Ad8 score reviewed for issues:  Issues making decisions: no  Less interest in hobbies / activities: no  Repeats questions, stories (family complaining): no  Trouble using ordinary gadgets (microwave, computer, phone):no  Forgets the month or year: no  Mismanaging finances: no  Remembering appts: no  Daily problems with thinking and/or memory: no Ad8 score is=  0  Immunization History  Administered Date(s) Administered  . Influenza Whole 06/04/2009, 05/31/2012  . Influenza, High Dose Seasonal PF 06/19/2013, 05/13/2016, 04/08/2017  . Pneumococcal Conjugate-13 05/31/2012  . Pneumococcal Polysaccharide-23 01/19/2016  . Td 01/08/2010   Screening Tests Health Maintenance  Topic Date Due  . TETANUS/TDAP  01/09/2020  . COLONOSCOPY  01/14/2026  . INFLUENZA VACCINE  Completed  . PNA vac Low Risk Adult  Completed      Plan:     Patient c/o elevated B/P at home, ongoing fatigue, SOB with exertion, and being diaphoretic.  Patient stated he denied stress test that was ordered per PCP during last visit when cardiology called to schedule. Nurse discussed  patient's complaints with Dr. Jenny Reichmann and informed MD of current B/P 178/92 pulse 84. As requested per Dr. Jenny Reichmann, a follow-up appointment was made with patient's PCP for 04/11/17.   Continue doing brain stimulating activities (puzzles, reading, adult coloring books, staying active) to keep memory sharp.   Continue to eat heart healthy diet (full of fruits, vegetables, whole grains, lean protein, water--limit salt, fat, and sugar intake) and increase physical activity as tolerated.   I have personally reviewed and noted the following in the patient's chart:   . Medical and social history . Use of alcohol, tobacco or illicit drugs  . Current medications and supplements . Functional ability and status . Nutritional status . Physical activity . Advanced directives . List of other physicians . Vitals . Screenings to include cognitive, depression, and falls . Referrals and appointments  In addition, I have reviewed and discussed with patient certain preventive protocols, quality metrics, and best practice recommendations. A written personalized care plan for preventive services as well as general preventive health recommendations were provided to patient.   Medical screening  examination/treatment/procedure(s) were performed by non-physician practitioner and as supervising physician I was immediately available for consultation/collaboration. I agree with above. Scarlette Calico, MD   Michiel Cowboy, RN  04/08/2017  Medical screening examination/treatment/procedure(s) were performed by non-physician practitioner and as supervising physician I was immediately available for consultation/collaboration. I agree with above. Cathlean Cower, MD

## 2017-04-08 NOTE — Progress Notes (Signed)
Pre visit review using our clinic review tool, if applicable. No additional management support is needed unless otherwise documented below in the visit note. 

## 2017-04-08 NOTE — Patient Instructions (Addendum)
Continue doing brain stimulating activities (puzzles, reading, adult coloring books, staying active) to keep memory sharp.   Continue to eat heart healthy diet (full of fruits, vegetables, whole grains, lean protein, water--limit salt, fat, and sugar intake) and increase physical activity as tolerated.   [name] , Thank you for taking time to come for your Medicare Wellness Visit. I appreciate your ongoing commitment to your health goals. Please review the following plan we discussed and let me know if I can assist you in the future.   These are the goals we discussed: Goals    . Stay as healthy as possible so that I can take care of my wife, enjoy life, family and worship God       This is a list of the screening recommended for you and due dates:  Health Maintenance  Topic Date Due  . Tetanus Vaccine  01/09/2020  . Colon Cancer Screening  01/14/2026  . Flu Shot  Completed  . Pneumonia vaccines  Completed     Stress and Stress Management Stress is a normal reaction to life events. It is what you feel when life demands more than you are used to or more than you can handle. Some stress can be useful. For example, the stress reaction can help you catch the last bus of the day, study for a test, or meet a deadline at work. But stress that occurs too often or for too long can cause problems. It can affect your emotional health and interfere with relationships and normal daily activities. Too much stress can weaken your immune system and increase your risk for physical illness. If you already have a medical problem, stress can make it worse. What are the causes? All sorts of life events may cause stress. An event that causes stress for one person may not be stressful for another person. Major life events commonly cause stress. These may be positive or negative. Examples include losing your job, moving into a new home, getting married, having a baby, or losing a loved one. Less obvious  life events may also cause stress, especially if they occur day after day or in combination. Examples include working long hours, driving in traffic, caring for children, being in debt, or being in a difficult relationship. What are the signs or symptoms? Stress may cause emotional symptoms including, the following:  Anxiety. This is feeling worried, afraid, on edge, overwhelmed, or out of control.  Anger. This is feeling irritated or impatient.  Depression. This is feeling sad, down, helpless, or guilty.  Difficulty focusing, remembering, or making decisions.  Stress may cause physical symptoms, including the following:  Aches and pains. These may affect your head, neck, back, stomach, or other areas of your body.  Tight muscles or clenched jaw.  Low energy or trouble sleeping.  Stress may cause unhealthy behaviors, including the following:  Eating to feel better (overeating) or skipping meals.  Sleeping too little, too much, or both.  Working too much or putting off tasks (procrastination).  Smoking, drinking alcohol, or using drugs to feel better.  How is this diagnosed? Stress is diagnosed through an assessment by your health care provider. Your health care provider will ask questions about your symptoms and any stressful life events.Your health care provider will also ask about your medical history and may order blood tests or other tests. Certain medical conditions and medicine can cause physical symptoms similar to stress. Mental illness can cause emotional symptoms and unhealthy behaviors similar  to stress. Your health care provider may refer you to a mental health professional for further evaluation. How is this treated? Stress management is the recommended treatment for stress.The goals of stress management are reducing stressful life events and coping with stress in healthy ways. Techniques for reducing stressful life events include the following:  Stress  identification. Self-monitor for stress and identify what causes stress for you. These skills may help you to avoid some stressful events.  Time management. Set your priorities, keep a calendar of events, and learn to say "no." These tools can help you avoid making too many commitments.  Techniques for coping with stress include the following:  Rethinking the problem. Try to think realistically about stressful events rather than ignoring them or overreacting. Try to find the positives in a stressful situation rather than focusing on the negatives.  Exercise. Physical exercise can release both physical and emotional tension. The key is to find a form of exercise you enjoy and do it regularly.  Relaxation techniques. These relax the body and mind. Examples include yoga, meditation, tai chi, biofeedback, deep breathing, progressive muscle relaxation, listening to music, being out in nature, journaling, and other hobbies. Again, the key is to find one or more that you enjoy and can do regularly.  Healthy lifestyle. Eat a balanced diet, get plenty of sleep, and do not smoke. Avoid using alcohol or drugs to relax.  Strong support network. Spend time with family, friends, or other people you enjoy being around.Express your feelings and talk things over with someone you trust.  Counseling or talktherapy with a mental health professional may be helpful if you are having difficulty managing stress on your own. Medicine is typically not recommended for the treatment of stress.Talk to your health care provider if you think you need medicine for symptoms of stress. Follow these instructions at home:  Keep all follow-up visits as directed by your health care provider.  Take all medicines as directed by your health care provider. Contact a health care provider if:  Your symptoms get worse or you start having new symptoms.  You feel overwhelmed by your problems and can no longer manage them on your  own. Get help right away if:  You feel like hurting yourself or someone else. This information is not intended to replace advice given to you by your health care provider. Make sure you discuss any questions you have with your health care provider. Document Released: 01/26/2001 Document Revised: 01/08/2016 Document Reviewed: 03/27/2013 Elsevier Interactive Patient Education  2017 [employer]. Insomnia Insomnia is a sleep disorder that makes it difficult to fall asleep or to stay asleep. Insomnia can cause tiredness (fatigue), low energy, difficulty concentrating, mood swings, and poor performance at work or school. There are three different ways to classify insomnia:  Difficulty falling asleep.  Difficulty staying asleep.  Waking up too early in the morning.  Any type of insomnia can be long-term (chronic) or short-term (acute). Both are common. Short-term insomnia usually lasts for three months or less. Chronic insomnia occurs at least three times a week for longer than three months. What are the causes? Insomnia may be caused by another condition, situation, or substance, such as:  Anxiety.  Certain medicines.  Gastroesophageal reflux disease (GERD) or other gastrointestinal conditions.  Asthma or other breathing conditions.  Restless legs syndrome, sleep apnea, or other sleep disorders.  Chronic pain.  Menopause. This may include hot flashes.  Stroke.  Abuse of alcohol, tobacco, or illegal drugs.  Depression.  Caffeine.  Neurological disorders, such as Alzheimer disease.  An overactive thyroid (hyperthyroidism).  The cause of insomnia may not be known. What increases the risk? Risk factors for insomnia include:  Gender. Women are more commonly affected than men.  Age. Insomnia is more common as you get older.  Stress. This may involve your professional or personal life.  Income. Insomnia is more common in people with lower income.  Lack of  exercise.  Irregular work schedule or night shifts.  Traveling between different time zones.  What are the signs or symptoms? If you have insomnia, trouble falling asleep or trouble staying asleep is the main symptom. This may lead to other symptoms, such as:  Feeling fatigued.  Feeling nervous about going to sleep.  Not feeling rested in the morning.  Having trouble concentrating.  Feeling irritable, anxious, or depressed.  How is this treated? Treatment for insomnia depends on the cause. If your insomnia is caused by an underlying condition, treatment will focus on addressing the condition. Treatment may also include:  Medicines to help you sleep.  Counseling or therapy.  Lifestyle adjustments.  Follow these instructions at home:  Take medicines only as directed by your health care provider.  Keep regular sleeping and waking hours. Avoid naps.  Keep a sleep diary to help you and your health care provider figure out what could be causing your insomnia. Include: ? When you sleep. ? When you wake up during the night. ? How well you sleep. ? How rested you feel the next day. ? Any side effects of medicines you are taking. ? What you eat and drink.  Make your bedroom a comfortable place where it is easy to fall asleep: ? Put up shades or special blackout curtains to block light from outside. ? Use a white noise machine to block noise. ? Keep the temperature cool.  Exercise regularly as directed by your health care provider. Avoid exercising right before bedtime.  Use relaxation techniques to manage stress. Ask your health care provider to suggest some techniques that may work well for you. These may include: ? Breathing exercises. ? Routines to release muscle tension. ? Visualizing peaceful scenes.  Cut back on alcohol, caffeinated beverages, and cigarettes, especially close to bedtime. These can disrupt your sleep.  Do not overeat or eat spicy foods right before  bedtime. This can lead to digestive discomfort that can make it hard for you to sleep.  Limit screen use before bedtime. This includes: ? Watching TV. ? Using your smartphone, tablet, and computer.  Stick to a routine. This can help you fall asleep faster. Try to do a quiet activity, brush your teeth, and go to bed at the same time each night.  Get out of bed if you are still awake after 15 minutes of trying to sleep. Keep the lights down, but try reading or doing a quiet activity. When you feel sleepy, go back to bed.  Make sure that you drive carefully. Avoid driving if you feel very sleepy.  Keep all follow-up appointments as directed by your health care provider. This is important. Contact a health care provider if:  You are tired throughout the day or have trouble in your daily routine due to sleepiness.  You continue to have sleep problems or your sleep problems get worse. Get help right away if:  You have serious thoughts about hurting yourself or someone else. This information is not intended to replace advice given to you by your  health care provider. Make sure you discuss any questions you have with your health care provider. Document Released: 07/30/2000 Document Revised: 01/02/2016 Document Reviewed: 05/03/2014 Elsevier Interactive Patient Education  [employer].

## 2017-04-11 ENCOUNTER — Other Ambulatory Visit (INDEPENDENT_AMBULATORY_CARE_PROVIDER_SITE_OTHER): Payer: Medicare Other

## 2017-04-11 ENCOUNTER — Encounter: Payer: Self-pay | Admitting: Internal Medicine

## 2017-04-11 ENCOUNTER — Ambulatory Visit (INDEPENDENT_AMBULATORY_CARE_PROVIDER_SITE_OTHER): Payer: Medicare Other | Admitting: Internal Medicine

## 2017-04-11 VITALS — BP 144/78 | HR 85 | Temp 98.0°F | Resp 16 | Ht 74.0 in | Wt 253.0 lb

## 2017-04-11 DIAGNOSIS — I25119 Atherosclerotic heart disease of native coronary artery with unspecified angina pectoris: Secondary | ICD-10-CM | POA: Diagnosis not present

## 2017-04-11 DIAGNOSIS — L309 Dermatitis, unspecified: Secondary | ICD-10-CM | POA: Insufficient documentation

## 2017-04-11 DIAGNOSIS — R9431 Abnormal electrocardiogram [ECG] [EKG]: Secondary | ICD-10-CM | POA: Insufficient documentation

## 2017-04-11 DIAGNOSIS — I10 Essential (primary) hypertension: Secondary | ICD-10-CM

## 2017-04-11 DIAGNOSIS — I2510 Atherosclerotic heart disease of native coronary artery without angina pectoris: Secondary | ICD-10-CM | POA: Diagnosis not present

## 2017-04-11 DIAGNOSIS — R739 Hyperglycemia, unspecified: Secondary | ICD-10-CM | POA: Diagnosis not present

## 2017-04-11 DIAGNOSIS — F5101 Primary insomnia: Secondary | ICD-10-CM

## 2017-04-11 HISTORY — DX: Dermatitis, unspecified: L30.9

## 2017-04-11 HISTORY — DX: Abnormal electrocardiogram (ECG) (EKG): R94.31

## 2017-04-11 LAB — BASIC METABOLIC PANEL
BUN: 19 mg/dL (ref 6–23)
CO2: 32 meq/L (ref 19–32)
Calcium: 9.6 mg/dL (ref 8.4–10.5)
Chloride: 94 mEq/L — ABNORMAL LOW (ref 96–112)
Creatinine, Ser: 1.32 mg/dL (ref 0.40–1.50)
GFR: 56.11 mL/min — ABNORMAL LOW (ref >60.00)
GLUCOSE: 121 mg/dL — AB (ref 70–99)
Potassium: 3.6 mEq/L (ref 3.5–5.1)
SODIUM: 131 meq/L — AB (ref 135–145)

## 2017-04-11 LAB — HEMOGLOBIN A1C: Hgb A1c MFr Bld: 6.2 % (ref 4.6–6.5)

## 2017-04-11 LAB — TROPONIN I: TNIDX: 0.01 ug/l (ref 0.00–0.06)

## 2017-04-11 MED ORDER — SUVOREXANT 15 MG PO TABS: 1.0000 | ORAL_TABLET | Freq: Every day | ORAL | 3 refills | Status: DC

## 2017-04-11 MED ORDER — CLOBETASOL PROPIONATE 0.05 % EX OINT: 1.0000 "application " | TOPICAL_OINTMENT | Freq: Two times a day (BID) | CUTANEOUS | 1 refills | Status: DC

## 2017-04-11 NOTE — Progress Notes (Signed)
Subjective:  Patient ID: Jack Farmer, male    DOB: 24-Sep-1940  Age: 76 y.o. MRN: [id number]  CC: Hypertension; Coronary Artery Disease; and Rash   HPI Jack Farmer presents for concerns about an episode of CP, diaphoresis, and DOE that occurred about 2 weeks ago while he was pulling weeds. He was seen by me about 4 months ago for similar sx's and an EKG was done and he was referred for a Lexiscan which has not been completed. He has a known hx of non-obstructive CAD.  Outpatient Medications Prior to Visit  Medication Sig Dispense Refill  . amLODipine (NORVASC) 5 MG tablet Take 1 tablet (5 mg total) by mouth daily. 90 tablet 1  . Ascorbic Acid (VITAMIN C) 1000 MG tablet Take 1,000 mg by mouth daily.    Marland Kitchen aspirin EC 81 MG tablet Take 81 mg by mouth daily.    Marland Kitchen b complex vitamins tablet Take 1 tablet by mouth daily.    . chlorthalidone (HYGROTON) 25 MG tablet Take 1 tablet (25 mg total) by mouth daily. 90 tablet 1  . metoprolol (LOPRESSOR) 50 MG tablet TAKE 1 TABLET (50 MG TOTAL) BY MOUTH 2 (TWO) TIMES DAILY. 180 tablet 3  . Omega-3 Fatty Acids (FISH OIL) 1000 MG CAPS Take 1,000 mg by mouth daily.    . simvastatin (ZOCOR) 40 MG tablet TAKE 1 TABLET (40 MG TOTAL) BY MOUTH EVERY EVENING. 90 tablet 1  . tamsulosin (FLOMAX) 0.4 MG CAPS capsule TAKE 1 CAPSULE (0.4 MG TOTAL) BY MOUTH DAILY. OVERDUE FOR YEARLY PHYSICAL MUST SEE MD FOR REFILLS 30 capsule 0  . telmisartan (MICARDIS) 40 MG tablet Take 1 tablet (40 mg total) by mouth daily. 90 tablet 1  . vitamin E 400 UNIT capsule Take 400 Units by mouth daily.    Marland Kitchen losartan-hydrochlorothiazide (HYZAAR) 100-12.5 MG tablet TAKE 1 TABLET EVERY DAY 90 tablet 1   No facility-administered medications prior to visit.     ROS Review of Systems  Constitutional: Positive for diaphoresis. Negative for appetite change, fatigue and unexpected weight change.  HENT: Negative.  Negative for trouble swallowing.   Eyes: Negative for visual  disturbance.  Respiratory: Positive for shortness of breath. Negative for cough, chest tightness and wheezing.   Cardiovascular: Negative for chest pain, palpitations and leg swelling.  Gastrointestinal: Negative for abdominal pain, constipation, diarrhea, nausea and vomiting.  Endocrine: Negative.  Negative for cold intolerance, heat intolerance, polydipsia, polyphagia and polyuria.  Genitourinary: Negative.  Negative for difficulty urinating, dysuria, frequency and urgency.  Musculoskeletal: Positive for arthralgias. Negative for back pain, myalgias and neck pain.  Skin: Positive for rash. Negative for color change.       Itchy rash in middle of lower back for several weeks that he has not treated  Allergic/Immunologic: Negative.   Neurological: Negative.  Negative for dizziness.  Hematological: Negative for adenopathy. Does not bruise/bleed easily.  Psychiatric/Behavioral: Positive for sleep disturbance (DFA AND FA). Negative for behavioral problems and decreased concentration.       He has tried Benadryl for the insomnia with no relief from his symptoms.    Objective:  BP (!) 144/78 (BP Location: Left Arm, Patient Position: Sitting, Cuff Size: Normal)   Pulse 85   Temp 98 F (36.7 C) (Oral)   Resp 16   Ht 6\' 2"  (1.88 m)   Wt 253 lb (114.8 kg)   SpO2 98%   BMI 32.48 kg/m   BP Readings from Last 3 Encounters:  04/11/17 (!) 144/78  04/08/17 (!) 178/92  01/05/17 (!) 152/90    Wt Readings from Last 3 Encounters:  04/11/17 253 lb (114.8 kg)  04/08/17 253 lb (114.8 kg)  01/05/17 248 lb 6 oz (112.7 kg)    Physical Exam  Constitutional: He is oriented to person, place, and time. No distress.  HENT:  Mouth/Throat: Oropharynx is clear and moist. No oropharyngeal exudate.  Eyes: Conjunctivae are normal. Right eye exhibits no discharge. Left eye exhibits no discharge. No scleral icterus.  Neck: Normal range of motion. Neck supple. No JVD present. No thyromegaly present.    Cardiovascular: Normal rate, regular rhythm and intact distal pulses.  Exam reveals no gallop and no friction rub.   No murmur heard. EKG- Sinus  Rhythm  -Nonspecific ST depression   +   Nonspecific T-abnormality  -Nondiagnostic.   ABNORMAL - no change from prior EKG  Pulmonary/Chest: Effort normal and breath sounds normal. No stridor. No respiratory distress. He has no wheezes. He has no rales. He exhibits no tenderness.  Abdominal: Soft. Bowel sounds are normal. He exhibits no distension and no mass. There is no tenderness. There is no rebound and no guarding.  Musculoskeletal: Normal range of motion. He exhibits no edema, tenderness or deformity.       Back:  Lymphadenopathy:    He has no cervical adenopathy.  Neurological: He is alert and oriented to person, place, and time.  Skin: Skin is warm and dry. No rash noted. He is not diaphoretic. No erythema. No pallor.  Vitals reviewed.   Lab Results  Component Value Date   WBC 6.5 01/19/2016   HGB 16.7 01/19/2016   HCT 48.3 01/19/2016   PLT 193.0 01/19/2016   GLUCOSE 121 (H) 04/11/2017   CHOL 94 01/05/2017   TRIG 269.0 (H) 01/05/2017   HDL 26.00 (L) 01/05/2017   LDLDIRECT 46.0 01/05/2017   LDLCALC 33 01/19/2016   ALT 27 01/19/2016   AST 22 01/19/2016   NA 131 (L) 04/11/2017   K 3.6 04/11/2017   CL 94 (L) 04/11/2017   CREATININE 1.32 04/11/2017   BUN 19 04/11/2017   CO2 32 04/11/2017   TSH 1.42 01/19/2016   PSA 1.16 01/19/2016   INR 0.96 09/10/2014   HGBA1C 6.2 04/11/2017    Dg Chest 2 View  Result Date: 10/24/2014 CLINICAL DATA:  Shortness of breath and dry cough for 3 days EXAM: CHEST  2 VIEW COMPARISON:  09/10/2014 FINDINGS: The heart size and mediastinal contours are within normal limits. Both lungs are clear. The visualized skeletal structures are unremarkable. IMPRESSION: No active cardiopulmonary disease. Electronically Signed   By: Inez Catalina M.D.   On: 10/24/2014 14:10    Assessment & Plan:   Durk was  seen today for hypertension, coronary artery disease and rash.  Diagnoses and all orders for this visit:  Hyperglycemia- He is up to 6.2%. He is prediabetic. Medical therapy is not indicated. He does commit to working on his lifestyle modifications. -     Basic metabolic panel; Future -     Hemoglobin A1c; Future  Atherosclerosis of native coronary artery of native heart without angina pectoris  Essential hypertension- his blood pressure is well-controlled. Lites and renal function are normal. -     Basic metabolic panel; Future  Coronary artery disease with angina pectoris, unspecified vessel or lesion type, unspecified whether native or transplanted heart [facility])- his symptoms are suspicious for angina. His EKG is abnormal but it's unchanged. His troponin is negative.  I've asked him to undergo a myocardial perfusion imaging to see if he is experiencing ischemia. -     EKG 12-Lead -     Troponin I; Future -     Myocardial Perfusion Imaging; Future  Abnormal electrocardiogram (ECG) (EKG)- as above -     Troponin I; Future -     Myocardial Perfusion Imaging; Future  Primary insomnia -     Suvorexant (BELSOMRA) 15 MG TABS; Take 1 tablet by mouth daily.  Eczema, unspecified type -     clobetasol ointment (TEMOVATE) 0.05 %; Apply 1 application topically 2 (two) times daily.   I have discontinued [name]'s losartan-hydrochlorothiazide. I am also having him start on clobetasol ointment and Suvorexant. Additionally, I am having him maintain his aspirin EC, vitamin C, vitamin E, b complex vitamins, Fish Oil, tamsulosin, metoprolol tartrate, simvastatin, chlorthalidone, telmisartan, and amLODipine.  Meds ordered this encounter  Medications  . clobetasol ointment (TEMOVATE) 0.05 %    Sig: Apply 1 application topically 2 (two) times daily.    Dispense:  45 g    Refill:  1  . Suvorexant (BELSOMRA) 15 MG TABS    Sig: Take 1 tablet by mouth daily.    Dispense:  30 tablet    Refill:  3      Follow-up: Return in about 3 months (around 07/12/2017).  Scarlette Calico, MD

## 2017-04-11 NOTE — Patient Instructions (Signed)

## 2017-04-12 NOTE — Addendum Note (Signed)
Addended by: Emelia Loron A on: 04/12/2017 02:40 PM   Modules accepted: Orders

## 2017-04-13 ENCOUNTER — Other Ambulatory Visit: Payer: Self-pay | Admitting: Internal Medicine

## 2017-04-13 DIAGNOSIS — I2510 Atherosclerotic heart disease of native coronary artery without angina pectoris: Secondary | ICD-10-CM

## 2017-04-13 DIAGNOSIS — I700 Atherosclerosis of aorta: Secondary | ICD-10-CM

## 2017-04-13 DIAGNOSIS — E785 Hyperlipidemia, unspecified: Secondary | ICD-10-CM

## 2017-04-21 ENCOUNTER — Telehealth (HOSPITAL_COMMUNITY): Payer: Self-pay | Admitting: *Deleted

## 2017-04-21 NOTE — Telephone Encounter (Signed)
Patient given detailed instructions per Myocardial Perfusion Study Information Sheet for the test on 04/25/17. Patient notified to arrive 15 minutes early and that it is imperative to arrive on time for appointment to keep from having the test rescheduled.  If you need to cancel or reschedule your appointment, please call the office within 24 hours of your appointment. . Patient verbalized understanding. Ryli Standlee Jacqueline    

## 2017-04-25 ENCOUNTER — Ambulatory Visit (HOSPITAL_COMMUNITY): Payer: Medicare Other | Attending: Cardiovascular Disease

## 2017-04-25 DIAGNOSIS — I25119 Atherosclerotic heart disease of native coronary artery with unspecified angina pectoris: Secondary | ICD-10-CM | POA: Insufficient documentation

## 2017-04-25 DIAGNOSIS — R9431 Abnormal electrocardiogram [ECG] [EKG]: Secondary | ICD-10-CM | POA: Insufficient documentation

## 2017-04-25 LAB — MYOCARDIAL PERFUSION IMAGING
CHL CUP NUCLEAR SRS: 4
CHL CUP NUCLEAR SSS: 7
CSEPPHR: 105 {beats}/min
LV sys vol: 51 mL
LVDIAVOL: 105 mL (ref 62–150)
RATE: 0.27
Rest HR: 78 {beats}/min
SDS: 3
TID: 0.99

## 2017-04-25 MED ORDER — REGADENOSON 0.4 MG/5ML IV SOLN
0.4000 mg | Freq: Once | INTRAVENOUS | Status: AC
  Administered 2017-04-25: 0.4 mg via INTRAVENOUS

## 2017-04-25 MED ORDER — TECHNETIUM TC 99M TETROFOSMIN IV KIT
10.6000 | PACK | Freq: Once | INTRAVENOUS | Status: AC | PRN
  Administered 2017-04-25: 10.6 via INTRAVENOUS
  Filled 2017-04-25: qty 11

## 2017-04-25 MED ORDER — TECHNETIUM TC 99M TETROFOSMIN IV KIT
31.7000 | PACK | Freq: Once | INTRAVENOUS | Status: AC | PRN
  Administered 2017-04-25: 31.7 via INTRAVENOUS
  Filled 2017-04-25: qty 32

## 2017-05-14 ENCOUNTER — Other Ambulatory Visit: Payer: Self-pay | Admitting: Internal Medicine

## 2017-05-14 DIAGNOSIS — I2510 Atherosclerotic heart disease of native coronary artery without angina pectoris: Secondary | ICD-10-CM

## 2017-05-14 DIAGNOSIS — I10 Essential (primary) hypertension: Secondary | ICD-10-CM

## 2017-05-18 ENCOUNTER — Other Ambulatory Visit: Payer: Self-pay | Admitting: Internal Medicine

## 2017-05-18 DIAGNOSIS — I2510 Atherosclerotic heart disease of native coronary artery without angina pectoris: Secondary | ICD-10-CM

## 2017-05-18 DIAGNOSIS — I10 Essential (primary) hypertension: Secondary | ICD-10-CM

## 2017-06-21 ENCOUNTER — Other Ambulatory Visit: Payer: Self-pay | Admitting: Internal Medicine

## 2017-06-21 DIAGNOSIS — I10 Essential (primary) hypertension: Secondary | ICD-10-CM

## 2017-09-16 ENCOUNTER — Other Ambulatory Visit: Payer: Self-pay | Admitting: Internal Medicine

## 2017-09-16 DIAGNOSIS — I10 Essential (primary) hypertension: Secondary | ICD-10-CM

## 2017-10-11 ENCOUNTER — Other Ambulatory Visit: Payer: Self-pay | Admitting: Internal Medicine

## 2017-10-11 DIAGNOSIS — E785 Hyperlipidemia, unspecified: Secondary | ICD-10-CM

## 2017-10-11 DIAGNOSIS — I700 Atherosclerosis of aorta: Secondary | ICD-10-CM

## 2017-10-11 DIAGNOSIS — I2510 Atherosclerotic heart disease of native coronary artery without angina pectoris: Secondary | ICD-10-CM

## 2017-10-11 MED ORDER — SIMVASTATIN 40 MG PO TABS: 40.0000 mg | ORAL_TABLET | Freq: Every evening | ORAL | 1 refills | Status: DC

## 2017-10-11 NOTE — Telephone Encounter (Signed)
Appt made

## 2017-10-11 NOTE — Addendum Note (Signed)
Addended by: Karle Barr on: 10/11/2017 03:57 PM   Modules accepted: Orders

## 2017-10-11 NOTE — Telephone Encounter (Signed)
rx was denied by  PCP until pt has scheduled a follow up appt.  Can you contact pt for an appt?

## 2017-10-26 ENCOUNTER — Encounter: Payer: Self-pay | Admitting: Internal Medicine

## 2017-10-26 ENCOUNTER — Other Ambulatory Visit (INDEPENDENT_AMBULATORY_CARE_PROVIDER_SITE_OTHER): Payer: Medicare Other

## 2017-10-26 ENCOUNTER — Ambulatory Visit (INDEPENDENT_AMBULATORY_CARE_PROVIDER_SITE_OTHER): Payer: Medicare Other | Admitting: Internal Medicine

## 2017-10-26 ENCOUNTER — Ambulatory Visit (INDEPENDENT_AMBULATORY_CARE_PROVIDER_SITE_OTHER)
Admission: RE | Admit: 2017-10-26 | Discharge: 2017-10-26 | Disposition: A | Discharge status: Home / Self Care | Payer: Medicare Other | Source: Ambulatory Visit | Attending: Internal Medicine | Admitting: Internal Medicine

## 2017-10-26 VITALS — BP 132/80 | HR 84 | Temp 98.2°F | Ht 74.0 in | Wt 259.0 lb

## 2017-10-26 DIAGNOSIS — M150 Primary generalized (osteo)arthritis: Secondary | ICD-10-CM

## 2017-10-26 DIAGNOSIS — E118 Type 2 diabetes mellitus with unspecified complications: Secondary | ICD-10-CM | POA: Diagnosis not present

## 2017-10-26 DIAGNOSIS — F17210 Nicotine dependence, cigarettes, uncomplicated: Secondary | ICD-10-CM

## 2017-10-26 DIAGNOSIS — I2510 Atherosclerotic heart disease of native coronary artery without angina pectoris: Secondary | ICD-10-CM

## 2017-10-26 DIAGNOSIS — R7303 Prediabetes: Secondary | ICD-10-CM

## 2017-10-26 DIAGNOSIS — M7062 Trochanteric bursitis, left hip: Secondary | ICD-10-CM

## 2017-10-26 DIAGNOSIS — I700 Atherosclerosis of aorta: Secondary | ICD-10-CM | POA: Diagnosis not present

## 2017-10-26 DIAGNOSIS — Z720 Tobacco use: Secondary | ICD-10-CM

## 2017-10-26 DIAGNOSIS — M25552 Pain in left hip: Secondary | ICD-10-CM | POA: Diagnosis not present

## 2017-10-26 DIAGNOSIS — M25551 Pain in right hip: Secondary | ICD-10-CM

## 2017-10-26 DIAGNOSIS — I10 Essential (primary) hypertension: Secondary | ICD-10-CM | POA: Diagnosis not present

## 2017-10-26 DIAGNOSIS — M7061 Trochanteric bursitis, right hip: Secondary | ICD-10-CM | POA: Insufficient documentation

## 2017-10-26 DIAGNOSIS — E785 Hyperlipidemia, unspecified: Secondary | ICD-10-CM

## 2017-10-26 DIAGNOSIS — M159 Polyosteoarthritis, unspecified: Secondary | ICD-10-CM

## 2017-10-26 HISTORY — DX: Trochanteric bursitis, right hip: M70.62

## 2017-10-26 HISTORY — DX: Tobacco use: Z72.0

## 2017-10-26 HISTORY — DX: Type 2 diabetes mellitus with unspecified complications: E11.8

## 2017-10-26 LAB — CBC WITH DIFFERENTIAL/PLATELET
BASOS ABS: 0 10*3/uL (ref 0.0–0.1)
Basophils Relative: 0.6 % (ref 0.0–3.0)
Eosinophils Absolute: 0.2 10*3/uL (ref 0.0–0.7)
Eosinophils Relative: 3.1 % (ref 0.0–5.0)
HCT: 47.6 % (ref 39.0–52.0)
Hemoglobin: 16.6 g/dL (ref 13.0–17.0)
LYMPHS ABS: 2 10*3/uL (ref 0.7–4.0)
Lymphocytes Relative: 28.4 % (ref 12.0–46.0)
MCHC: 34.9 g/dL (ref 30.0–36.0)
MCV: 92 fl (ref 78.0–100.0)
MONO ABS: 0.5 10*3/uL (ref 0.1–1.0)
MONOS PCT: 7.1 % (ref 3.0–12.0)
NEUTROS PCT: 60.8 % (ref 43.0–77.0)
Neutro Abs: 4.2 10*3/uL (ref 1.4–7.7)
Platelets: 187 10*3/uL (ref 150.0–400.0)
RBC: 5.17 Mil/uL (ref 4.22–5.81)
RDW: 13 % (ref 11.5–15.5)
WBC: 6.9 10*3/uL (ref 4.0–10.5)

## 2017-10-26 LAB — COMPREHENSIVE METABOLIC PANEL
ALT: 27 U/L (ref 0–53)
AST: 17 U/L (ref 0–37)
Albumin: 4.4 g/dL (ref 3.5–5.2)
Alkaline Phosphatase: 77 U/L (ref 39–117)
BUN: 19 mg/dL (ref 6–23)
CALCIUM: 9.5 mg/dL (ref 8.4–10.5)
CHLORIDE: 96 meq/L (ref 96–112)
CO2: 31 meq/L (ref 19–32)
Creatinine, Ser: 1.26 mg/dL (ref 0.40–1.50)
GFR: 59.12 mL/min — AB (ref >60.00)
GLUCOSE: 113 mg/dL — AB (ref 70–99)
POTASSIUM: 3.8 meq/L (ref 3.5–5.1)
Sodium: 133 mEq/L — ABNORMAL LOW (ref 135–145)
Total Bilirubin: 1 mg/dL (ref 0.2–1.2)
Total Protein: 7.1 g/dL (ref 6.0–8.3)

## 2017-10-26 LAB — LIPID PANEL
CHOL/HDL RATIO: 3
Cholesterol: 86 mg/dL (ref 0–200)
HDL: 27.2 mg/dL — AB (ref >39.00)
NONHDL: 58.95
TRIGLYCERIDES: 253 mg/dL — AB (ref 0.0–149.0)
VLDL: 50.6 mg/dL — AB (ref 0.0–40.0)

## 2017-10-26 LAB — HEMOGLOBIN A1C: HEMOGLOBIN A1C: 6.5 % (ref 4.6–6.5)

## 2017-10-26 LAB — LDL CHOLESTEROL, DIRECT: LDL DIRECT: 46 mg/dL

## 2017-10-26 MED ORDER — TRAMADOL HCL 50 MG PO TABS: 50.0000 mg | ORAL_TABLET | Freq: Four times a day (QID) | ORAL | 5 refills | Status: DC | PRN

## 2017-10-26 MED ORDER — VARENICLINE TARTRATE 0.5 MG X 11 & 1 MG X 42 PO MISC: ORAL | 0 refills | Status: DC

## 2017-10-26 MED ORDER — VARENICLINE TARTRATE 1 MG PO TABS: 1.0000 mg | ORAL_TABLET | Freq: Two times a day (BID) | ORAL | 3 refills | Status: DC

## 2017-10-26 NOTE — Progress Notes (Deleted)
Subjective:  Patient ID: Jack Farmer, male    DOB: 1940-12-24  Age: 77 y.o. MRN: [id number]  CC: No chief complaint on file.   HPI Jack Farmer presents for ***  Outpatient Medications Prior to Visit  Medication Sig Dispense Refill  . amLODipine (NORVASC) 5 MG tablet TAKE 1 TABLET BY MOUTH EVERY DAY 90 tablet 0  . Ascorbic Acid (VITAMIN C) 1000 MG tablet Take 1,000 mg by mouth daily.    Marland Kitchen aspirin EC 81 MG tablet Take 81 mg by mouth daily.    Marland Kitchen b complex vitamins tablet Take 1 tablet by mouth daily.    Marland Kitchen BESIVANCE 0.6 % SUSP INSTILL 1 DROP INTO RIGHT EYE 3 TIMES A DAY  1  . chlorthalidone (HYGROTON) 25 MG tablet TAKE 1 TABLET (25 MG TOTAL) BY MOUTH DAILY. 90 tablet 1  . clobetasol ointment (TEMOVATE) 6.06 % Apply 1 application topically 2 (two) times daily. 45 g 1  . DUREZOL 0.05 % EMUL INSTILL 1 DROP INTO RIGHT EYE 3 TIMES A DAY  1  . metoprolol (LOPRESSOR) 50 MG tablet TAKE 1 TABLET (50 MG TOTAL) BY MOUTH 2 (TWO) TIMES DAILY. 180 tablet 3  . Omega-3 Fatty Acids (FISH OIL) 1000 MG CAPS Take 1,000 mg by mouth daily.    Marland Kitchen PROLENSA 0.07 % SOLN INSTILL 1 DROP INTO RIGHT EYE AT BEDTIME  1  . simvastatin (ZOCOR) 40 MG tablet Take 1 tablet (40 mg total) by mouth every evening. 90 tablet 1  . tamsulosin (FLOMAX) 0.4 MG CAPS capsule TAKE 1 CAPSULE (0.4 MG TOTAL) BY MOUTH DAILY. OVERDUE FOR YEARLY PHYSICAL MUST SEE MD FOR REFILLS 30 capsule 0  . telmisartan (MICARDIS) 40 MG tablet TAKE 1 TABLET (40 MG TOTAL) BY MOUTH DAILY. 90 tablet 1  . tiZANidine (ZANAFLEX) 4 MG tablet TAKE 1 TABLET BY MOUTH AT BEDTIME AS NEEDED FOR SPASMS  1  . vitamin E 400 UNIT capsule Take 400 Units by mouth daily.    . Suvorexant (BELSOMRA) 15 MG TABS Take 1 tablet by mouth daily. 30 tablet 3   No facility-administered medications prior to visit.     ROS Review of Systems  Objective:  BP 132/80 (BP Location: Left Arm, Patient Position: Sitting, Cuff Size: Large)   Pulse 84   Temp 98.2 F (36.8  C) (Oral)   Ht 6\' 2"  (1.88 m)   Wt 259 lb (117.5 kg)   SpO2 98%   BMI 33.25 kg/m   BP Readings from Last 3 Encounters:  10/26/17 132/80  04/11/17 (!) 144/78  04/08/17 (!) 178/92    Wt Readings from Last 3 Encounters:  10/26/17 259 lb (117.5 kg)  04/25/17 253 lb (114.8 kg)  04/11/17 253 lb (114.8 kg)    Physical Exam  Lab Results  Component Value Date   WBC 6.5 01/19/2016   HGB 16.7 01/19/2016   HCT 48.3 01/19/2016   PLT 193.0 01/19/2016   GLUCOSE 121 (H) 04/11/2017   CHOL 94 01/05/2017   TRIG 269.0 (H) 01/05/2017   HDL 26.00 (L) 01/05/2017   LDLDIRECT 46.0 01/05/2017   LDLCALC 33 01/19/2016   ALT 27 01/19/2016   AST 22 01/19/2016   NA 131 (L) 04/11/2017   K 3.6 04/11/2017   CL 94 (L) 04/11/2017   CREATININE 1.32 04/11/2017   BUN 19 04/11/2017   CO2 32 04/11/2017   TSH 1.42 01/19/2016   PSA 1.16 01/19/2016   INR 0.96 09/10/2014   HGBA1C 6.2 04/11/2017  Dg Chest 2 View  Result Date: 10/24/2014 CLINICAL DATA:  Shortness of breath and dry cough for 3 days EXAM: CHEST  2 VIEW COMPARISON:  09/10/2014 FINDINGS: The heart size and mediastinal contours are within normal limits. Both lungs are clear. The visualized skeletal structures are unremarkable. IMPRESSION: No active cardiopulmonary disease. Electronically Signed   By: Inez Catalina M.D.   On: 10/24/2014 14:10    Assessment & Plan:   Diagnoses and all orders for this visit:  Essential hypertension  Prediabetes   I have discontinued Thayer Jew B. Thorburn Jr's Suvorexant. I am also having him maintain his aspirin EC, vitamin C, vitamin E, b complex vitamins, Fish Oil, tamsulosin, metoprolol tartrate, clobetasol ointment, telmisartan, chlorthalidone, amLODipine, simvastatin, PROLENSA, DUREZOL, tiZANidine, and BESIVANCE.  No orders of the defined types were placed in this encounter.    Follow-up: No Follow-up on file.  Scarlette Calico, MD

## 2017-10-26 NOTE — Patient Instructions (Signed)

## 2017-10-26 NOTE — Progress Notes (Signed)
Subjective:  Patient ID: Jack Farmer, male    DOB: 1941/02/15  Age: 77 y.o. MRN: [id number]  CC: Hip Pain; Osteoarthritis; and Hypertension   HPI Jack Farmer presents for f/up - He complains of chronic, worsening bilateral hip pain more on the left than the right.  He has been taking a muscle relaxer for symptom relief.  The pain keeps him awake at night.  He has also started smoking again.  He wants to try Chantix to help him quit smoking.  Outpatient Medications Prior to Visit  Medication Sig Dispense Refill  . amLODipine (NORVASC) 5 MG tablet TAKE 1 TABLET BY MOUTH EVERY DAY 90 tablet 0  . Ascorbic Acid (VITAMIN C) 1000 MG tablet Take 1,000 mg by mouth daily.    Marland Kitchen aspirin EC 81 MG tablet Take 81 mg by mouth daily.    Marland Kitchen b complex vitamins tablet Take 1 tablet by mouth daily.    Marland Kitchen BESIVANCE 0.6 % SUSP INSTILL 1 DROP INTO RIGHT EYE 3 TIMES A DAY  1  . chlorthalidone (HYGROTON) 25 MG tablet TAKE 1 TABLET (25 MG TOTAL) BY MOUTH DAILY. 90 tablet 1  . clobetasol ointment (TEMOVATE) 3.08 % Apply 1 application topically 2 (two) times daily. 45 g 1  . DUREZOL 0.05 % EMUL INSTILL 1 DROP INTO RIGHT EYE 3 TIMES A DAY  1  . metoprolol (LOPRESSOR) 50 MG tablet TAKE 1 TABLET (50 MG TOTAL) BY MOUTH 2 (TWO) TIMES DAILY. 180 tablet 3  . Omega-3 Fatty Acids (FISH OIL) 1000 MG CAPS Take 1,000 mg by mouth daily.    Marland Kitchen PROLENSA 0.07 % SOLN INSTILL 1 DROP INTO RIGHT EYE AT BEDTIME  1  . simvastatin (ZOCOR) 40 MG tablet Take 1 tablet (40 mg total) by mouth every evening. 90 tablet 1  . tamsulosin (FLOMAX) 0.4 MG CAPS capsule TAKE 1 CAPSULE (0.4 MG TOTAL) BY MOUTH DAILY. OVERDUE FOR YEARLY PHYSICAL MUST SEE MD FOR REFILLS 30 capsule 0  . telmisartan (MICARDIS) 40 MG tablet TAKE 1 TABLET (40 MG TOTAL) BY MOUTH DAILY. 90 tablet 1  . tiZANidine (ZANAFLEX) 4 MG tablet TAKE 1 TABLET BY MOUTH AT BEDTIME AS NEEDED FOR SPASMS  1  . vitamin E 400 UNIT capsule Take 400 Units by mouth daily.    .  Suvorexant (BELSOMRA) 15 MG TABS Take 1 tablet by mouth daily. 30 tablet 3   No facility-administered medications prior to visit.     ROS Review of Systems  Constitutional: Positive for fatigue and unexpected weight change. Negative for appetite change, chills and diaphoresis.  HENT: Negative.   Eyes: Negative for visual disturbance.  Respiratory: Negative for cough, chest tightness, shortness of breath and wheezing.   Cardiovascular: Negative for chest pain, palpitations and leg swelling.  Gastrointestinal: Negative for abdominal pain, constipation, diarrhea, nausea and vomiting.  Endocrine: Negative.   Genitourinary: Negative.  Negative for decreased urine volume, difficulty urinating, dysuria, hematuria and urgency.  Musculoskeletal: Positive for arthralgias and back pain. Negative for myalgias and neck stiffness.  Skin: Negative.  Negative for pallor and rash.  Allergic/Immunologic: Negative.   Neurological: Negative.  Negative for dizziness, weakness, numbness and headaches.  Hematological: Negative for adenopathy. Does not bruise/bleed easily.  Psychiatric/Behavioral: Negative.     Objective:  BP 132/80 (BP Location: Left Arm, Patient Position: Sitting, Cuff Size: Large)   Pulse 84   Temp 98.2 F (36.8 C) (Oral)   Ht 6\' 2"  (1.88 m)   Wt 259  lb (117.5 kg)   SpO2 98%   BMI 33.25 kg/m   BP Readings from Last 3 Encounters:  10/26/17 132/80  04/11/17 (!) 144/78  04/08/17 (!) 178/92    Wt Readings from Last 3 Encounters:  10/26/17 259 lb (117.5 kg)  04/25/17 253 lb (114.8 kg)  04/11/17 253 lb (114.8 kg)    Physical Exam  Constitutional: He is oriented to person, place, and time. No distress.  HENT:  Mouth/Throat: Oropharynx is clear and moist. No oropharyngeal exudate.  Eyes: Conjunctivae are normal. Left eye exhibits no discharge. No scleral icterus.  Neck: Normal range of motion. Neck supple. No JVD present. No thyromegaly present.  Cardiovascular: Normal rate,  regular rhythm and normal heart sounds. Exam reveals no gallop.  No murmur heard. Pulmonary/Chest: Effort normal and breath sounds normal. No respiratory distress. He has no wheezes. He has no rales.  Abdominal: Soft. Bowel sounds are normal. He exhibits no distension and no mass. There is no tenderness. There is no guarding.  Musculoskeletal: He exhibits no edema, tenderness or deformity.       Right hip: He exhibits decreased range of motion. He exhibits normal strength, no bony tenderness, no swelling and no deformity. Tenderness: over the GT.       Left hip: He exhibits decreased range of motion. He exhibits normal strength, no bony tenderness and no deformity. Tenderness: over the GT.  Lymphadenopathy:    He has no cervical adenopathy.  Neurological: He is alert and oriented to person, place, and time.  Skin: Skin is warm and dry. No rash noted. He is not diaphoretic. No erythema. No pallor.  Vitals reviewed.   Lab Results  Component Value Date   WBC 6.9 10/26/2017   HGB 16.6 10/26/2017   HCT 47.6 10/26/2017   PLT 187.0 10/26/2017   GLUCOSE 113 (H) 10/26/2017   CHOL 86 10/26/2017   TRIG 253.0 (H) 10/26/2017   HDL 27.20 (L) 10/26/2017   LDLDIRECT 46.0 10/26/2017   LDLCALC 33 01/19/2016   ALT 27 10/26/2017   AST 17 10/26/2017   NA 133 (L) 10/26/2017   K 3.8 10/26/2017   CL 96 10/26/2017   CREATININE 1.26 10/26/2017   BUN 19 10/26/2017   CO2 31 10/26/2017   TSH 1.42 01/19/2016   PSA 1.16 01/19/2016   INR 0.96 09/10/2014   HGBA1C 6.5 10/26/2017    Dg Chest 2 View  Result Date: 10/24/2014 CLINICAL DATA:  Shortness of breath and dry cough for 3 days EXAM: CHEST  2 VIEW COMPARISON:  09/10/2014 FINDINGS: The heart size and mediastinal contours are within normal limits. Both lungs are clear. The visualized skeletal structures are unremarkable. IMPRESSION: No active cardiopulmonary disease. Electronically Signed   By: Inez Catalina M.D.   On: 10/24/2014 14:10    Assessment &  Plan:   Jack Farmer was seen today for hip pain, osteoarthritis and hypertension.  Diagnoses and all orders for this visit:  Essential hypertension- His blood pressure is well controlled.  Electrolytes and renal function are normal. -     CBC with Differential/Platelet; Future -     Comprehensive metabolic panel; Future  Prediabetes -     Comprehensive metabolic panel; Future -     Hemoglobin A1c; Future  Hyperlipidemia with target LDL less than 70- He has achieved his LDL goal and is doing well on the statin. -     Lipid panel; Future  Atherosclerosis of native coronary artery of native heart without angina pectoris- He has had  no recent symptoms concerning for angina.  Will continue aggressive risk factor modification. -     Lipid panel; Future  Atherosclerosis of aorta ([place])- Will continue risk factor modification. -     Lipid panel; Future  Primary osteoarthritis involving multiple joints -     traMADol (ULTRAM) 50 MG tablet; Take 1 tablet (50 mg total) by mouth every 6 (six) hours as needed.  Pain of both hip joints- His plain films showed mild DJD but there is also tenderness to palpation over the greater trochanters.  He may also have trochanteric bursitis.  I have asked him to see sports medicine to see if he would benefit from a steroid injection. -     DG HIPS BILAT WITH PELVIS 3-4 VIEWS; Future -     traMADol (ULTRAM) 50 MG tablet; Take 1 tablet (50 mg total) by mouth every 6 (six) hours as needed. -     Ambulatory referral to Sports Medicine  Tobacco abuse- He expressed a sincere and dedicated desire to quit smoking.  I have asked him to try Chantix and nicotine patches. -     varenicline (CHANTIX CONTINUING MONTH PAK) 1 MG tablet; Take 1 tablet (1 mg total) by mouth 2 (two) times daily. -     varenicline (CHANTIX STARTING MONTH PAK) 0.5 MG X 11 & 1 MG X 42 tablet; Take one 0.5 mg tablet by mouth once daily for 3 days, then increase to one 0.5 mg tablet twice daily for 4 days,  then increase to one 1 mg tablet twice daily.  Type 2 diabetes mellitus with complication, without long-term current use of insulin (HCC)-his A1c is up to 6.5%.  He has new onset type 2 diabetes mellitus.  Medical therapy is not indicated.   I have discontinued Laurier B. Thorburn Jr's Suvorexant. I am also having him start on traMADol, varenicline, and varenicline. Additionally, I am having him maintain his aspirin EC, vitamin C, vitamin E, b complex vitamins, Fish Oil, tamsulosin, metoprolol tartrate, clobetasol ointment, telmisartan, chlorthalidone, amLODipine, simvastatin, PROLENSA, DUREZOL, tiZANidine, and BESIVANCE.  Meds ordered this encounter  Medications  . traMADol (ULTRAM) 50 MG tablet    Sig: Take 1 tablet (50 mg total) by mouth every 6 (six) hours as needed.    Dispense:  90 tablet    Refill:  5  . varenicline (CHANTIX CONTINUING MONTH PAK) 1 MG tablet    Sig: Take 1 tablet (1 mg total) by mouth 2 (two) times daily.    Dispense:  60 tablet    Refill:  3  . varenicline (CHANTIX STARTING MONTH PAK) 0.5 MG X 11 & 1 MG X 42 tablet    Sig: Take one 0.5 mg tablet by mouth once daily for 3 days, then increase to one 0.5 mg tablet twice daily for 4 days, then increase to one 1 mg tablet twice daily.    Dispense:  53 tablet    Refill:  0     Follow-up: Return in about 6 months (around 04/28/2018).  Scarlette Calico, MD

## 2017-11-01 ENCOUNTER — Telehealth: Payer: Self-pay | Admitting: Internal Medicine

## 2017-11-01 NOTE — Telephone Encounter (Signed)
Pt informed of xray results and stated understanding.

## 2017-11-01 NOTE — Telephone Encounter (Signed)
yes

## 2017-11-01 NOTE — Telephone Encounter (Signed)
Copied from [place]. Topic: Quick Communication - Other Results >> Nov 01, 2017  8:56 AM Jack Farmer L, NT wrote: Patient would like a call in regards to his results for his x rays from last week please call him at  [phone]

## 2017-11-01 NOTE — Telephone Encounter (Signed)
Okay to give pt the following results?  IMPRESSION: Mild degenerative change without acute abnormality.

## 2017-11-08 ENCOUNTER — Ambulatory Visit (INDEPENDENT_AMBULATORY_CARE_PROVIDER_SITE_OTHER): Payer: Medicare Other | Admitting: Family Medicine

## 2017-11-08 ENCOUNTER — Encounter: Payer: Self-pay | Admitting: Family Medicine

## 2017-11-08 DIAGNOSIS — M7061 Trochanteric bursitis, right hip: Secondary | ICD-10-CM

## 2017-11-08 DIAGNOSIS — M7062 Trochanteric bursitis, left hip: Secondary | ICD-10-CM

## 2017-11-08 MED ORDER — PREDNISONE 5 MG PO TABS: ORAL_TABLET | ORAL | 0 refills | Status: DC

## 2017-11-08 NOTE — Assessment & Plan Note (Signed)
Pain likely GT bursitis. Has to ambulate with a single point cane. Has chronic back issues so possible for spinal stenosis  - prednisone  - counseled on conservative care  - counseled on HEP  - if no improvement would try injections.

## 2017-11-08 NOTE — Patient Instructions (Signed)
Please try the exercises  Please tried to rub Aspercreme with lidocaine over the area. Please follow-up with me if there is no improvement in 3-4 weeks.

## 2017-11-08 NOTE — Progress Notes (Signed)
Jack Farmer - 77 y.o. male MRN [id number]  Date of birth: Jun 02, 1941  SUBJECTIVE:  Including CC & ROS.  Chief Complaint  Patient presents with  . Bilateral Hip pain    Jack Farmer is a 77 y.o. male that is presenting with bilateral hip pain. Left hip is worse than right. Pain is chronic in nature has been increasing over the past year. Pain is located in the posterior aspect of his greater trochanteric. Denies tingling or numbness. Laying down worsens the pain. Pain is constant worse when standing,laying down. He has to sleep in his chair to aleiveate the pain. He has been taking tramadol with no improvement. He uses a cane to ambulate daily.   Independent review of the bilateral hip x-rays from 3/13 shows minimal to no changes of the hip joint itself. Chest stable post hardware of the lumbar spine.  Review of Systems  Constitutional: Negative for fever.  HENT: Negative for congestion.   Respiratory: Negative for cough.   Cardiovascular: Negative for chest pain.  Gastrointestinal: Negative for abdominal pain.  Musculoskeletal: Positive for gait problem.  Skin: Negative for color change.  Allergic/Immunologic: Negative for immunocompromised state.  Neurological: Negative for weakness.  Hematological: Negative for adenopathy.  Psychiatric/Behavioral: Negative for agitation.    HISTORY: Past Medical, Surgical, Social, and Family History Reviewed & Updated per EMR.   Pertinent Historical Findings include:  Past Medical History:  Diagnosis Date  . Arthritis   . CAD (coronary artery disease)   . Frequent urination   . GERD (gastroesophageal reflux disease)   . Hyperlipidemia   . Hypertension   . Hypertrophy of prostate with urinary obstruction and other lower urinary tract symptoms (LUTS)   . Low back pain   . Low HDL (under 40)   . Shortness of breath dyspnea    with ambulation  . Skin cancer    removed from face    Past Surgical History:  Procedure  Laterality Date  . ANTERIOR LAT LUMBAR FUSION  04/07/2012   Procedure: ANTERIOR LATERAL LUMBAR FUSION 2 LEVELS;  Surgeon: Eustace Moore, MD;  Location: [facility];  Service: Neurosurgery;  Laterality: Right;  Lumbar three-four and four-five extreme lumbar interbody fusion  . BACK SURGERY    . CARDIAC CATHETERIZATION    . COLONOSCOPY W/ POLYPECTOMY    . EYE SURGERY     tightened muscles in right eye  . TONSILLECTOMY      Allergies  Allergen Reactions  . Cephalexin Anaphylaxis  . Indocin [Indomethacin] Other (See Comments)     Gi bleed    Family History  Problem Relation Age of Onset  . Alcohol abuse Other   . Drug abuse Other   . Arthritis Other   . Hypertension Other      Social History   Socioeconomic History  . Marital status: Married    Spouse name: Not on file  . Number of children: 4  . Years of education: Not on file  . Highest education level: Not on file  Occupational History  . Not on file  Social Needs  . Financial resource strain: Not on file  . Food insecurity:    Worry: Not on file    Inability: Not on file  . Transportation needs:    Medical: Not on file    Non-medical: Not on file  Tobacco Use  . Smoking status: Current Some Day Smoker    Types: Cigarettes    Last attempt to quit:  08/16/2006    Years since quitting: 11.2  . Smokeless tobacco: Never Used  Substance and Sexual Activity  . Alcohol use: Yes    Comment: Rare  . Drug use: No  . Sexual activity: Yes  Lifestyle  . Physical activity:    Days per week: Not on file    Minutes per session: Not on file  . Stress: Not on file  Relationships  . Social connections:    Talks on phone: Not on file    Gets together: Not on file    Attends religious service: Not on file    Active member of club or organization: Not on file    Attends meetings of clubs or organizations: Not on file    Relationship status: Not on file  . Intimate partner violence:    Fear of current or ex partner: Not on  file    Emotionally abused: Not on file    Physically abused: Not on file    Forced sexual activity: Not on file  Other Topics Concern  . Not on file  Social History Narrative  . Not on file     PHYSICAL EXAM:  VS: BP 138/84 (BP Location: Left Arm, Patient Position: Sitting, Cuff Size: Normal)   Pulse 68   Temp 97.6 F (36.4 C) (Oral)   Ht 6\' 2"  (1.88 m)   Wt 259 lb (117.5 kg)   SpO2 97%   BMI 33.25 kg/m  Physical Exam Gen: NAD, alert, cooperative with exam, well-appearing ENT: normal lips, normal nasal mucosa,  Eye: normal EOM, normal conjunctiva and lids CV:  no edema, +2 pedal pulses   Resp: no accessory muscle use, non-labored,  Skin: no rashes, no areas of induration  Neuro: normal tone, normal sensation to touch Psych:  normal insight, alert and oriented MSK:  Left and Right hip:  TTP of the right and left GT  Normal IR and ER  Normal strength to resistance with flexion  Negative SLR b/l  Ambulating with single point cane  Mild limp  Neurovascularly intact.      ASSESSMENT & PLAN:   Trochanteric bursitis of both hips Pain likely GT bursitis. Has to ambulate with a single point cane. Has chronic back issues so possible for spinal stenosis  - prednisone  - counseled on conservative care  - counseled on HEP  - if no improvement would try injections.

## 2017-11-10 ENCOUNTER — Other Ambulatory Visit: Payer: Self-pay | Admitting: Internal Medicine

## 2017-11-10 DIAGNOSIS — I2510 Atherosclerotic heart disease of native coronary artery without angina pectoris: Secondary | ICD-10-CM

## 2017-11-10 DIAGNOSIS — I10 Essential (primary) hypertension: Secondary | ICD-10-CM

## 2017-11-30 ENCOUNTER — Encounter: Payer: Self-pay | Admitting: Family

## 2017-11-30 ENCOUNTER — Ambulatory Visit (INDEPENDENT_AMBULATORY_CARE_PROVIDER_SITE_OTHER): Payer: Medicare Other | Admitting: Family

## 2017-11-30 VITALS — BP 128/70 | HR 88 | Temp 98.1°F | Ht 74.0 in | Wt 258.1 lb

## 2017-11-30 DIAGNOSIS — R062 Wheezing: Secondary | ICD-10-CM | POA: Diagnosis not present

## 2017-11-30 DIAGNOSIS — J209 Acute bronchitis, unspecified: Secondary | ICD-10-CM

## 2017-11-30 MED ORDER — PREDNISONE 20 MG PO TABS: 20.0000 mg | ORAL_TABLET | Freq: Every day | ORAL | 0 refills | Status: DC

## 2017-11-30 MED ORDER — DOXYCYCLINE HYCLATE 100 MG PO TABS: 100.0000 mg | ORAL_TABLET | Freq: Two times a day (BID) | ORAL | 0 refills | Status: DC

## 2017-11-30 MED ORDER — BENZONATATE 100 MG PO CAPS: 100.0000 mg | ORAL_CAPSULE | Freq: Three times a day (TID) | ORAL | 0 refills | Status: DC | PRN

## 2017-11-30 MED ORDER — ALBUTEROL SULFATE (2.5 MG/3ML) 0.083% IN NEBU
2.5000 mg | INHALATION_SOLUTION | Freq: Once | RESPIRATORY_TRACT | Status: AC
  Administered 2017-11-30: 2.5 mg via RESPIRATORY_TRACT

## 2017-11-30 NOTE — Progress Notes (Signed)
Jack Farmer is a 77 y.o. male with the following history as recorded in [employer]:  Patient Active Problem List   Diagnosis Date Noted  . Trochanteric bursitis of both hips 10/26/2017  . Tobacco abuse 10/26/2017  . Type 2 diabetes mellitus with complication, without long-term current use of insulin ([place]) 10/26/2017  . Abnormal electrocardiogram (ECG) (EKG) 04/11/2017  . Eczema 04/11/2017  . Primary insomnia 11/22/2016  . BPH (benign prostatic hyperplasia) 10/17/2014  . Atherosclerosis of aorta ([place]) 06/25/2013  . Abdominal bruit 06/19/2013  . Routine general medical examination at a health care facility 08/29/2011  . Prediabetes 08/26/2011  . ERECTILE DYSFUNCTION 01/08/2010  . Hyperlipidemia with target LDL less than 70 12/30/2008  . GERD 12/30/2008  . Coronary atherosclerosis 12/26/2008  . Essential hypertension 09/04/2008  . Osteoarthritis 09/04/2008    Current Outpatient Medications  Medication Sig Dispense Refill  . amLODipine (NORVASC) 5 MG tablet TAKE 1 TABLET BY MOUTH EVERY DAY 90 tablet 0  . Ascorbic Acid (VITAMIN C) 1000 MG tablet Take 1,000 mg by mouth daily.    Marland Kitchen aspirin EC 81 MG tablet Take 81 mg by mouth daily.    Marland Kitchen b complex vitamins tablet Take 1 tablet by mouth daily.    Marland Kitchen BESIVANCE 0.6 % SUSP INSTILL 1 DROP INTO RIGHT EYE 3 TIMES A DAY  1  . chlorthalidone (HYGROTON) 25 MG tablet TAKE 1 TABLET EVERY DAY 90 tablet 1  . clobetasol ointment (TEMOVATE) 1.32 % Apply 1 application topically 2 (two) times daily. 45 g 1  . DUREZOL 0.05 % EMUL INSTILL 1 DROP INTO RIGHT EYE 3 TIMES A DAY  1  . metoprolol (LOPRESSOR) 50 MG tablet TAKE 1 TABLET (50 MG TOTAL) BY MOUTH 2 (TWO) TIMES DAILY. 180 tablet 3  . Omega-3 Fatty Acids (FISH OIL) 1000 MG CAPS Take 1,000 mg by mouth daily.    Marland Kitchen PROLENSA 0.07 % SOLN INSTILL 1 DROP INTO RIGHT EYE AT BEDTIME  1  . simvastatin (ZOCOR) 40 MG tablet Take 1 tablet (40 mg total) by mouth every evening. 90 tablet 1  . tamsulosin (FLOMAX)  0.4 MG CAPS capsule TAKE 1 CAPSULE (0.4 MG TOTAL) BY MOUTH DAILY. OVERDUE FOR YEARLY PHYSICAL MUST SEE MD FOR REFILLS 30 capsule 0  . telmisartan (MICARDIS) 40 MG tablet TAKE 1 TABLET EVERY DAY 90 tablet 1  . tiZANidine (ZANAFLEX) 4 MG tablet TAKE 1 TABLET BY MOUTH AT BEDTIME AS NEEDED FOR SPASMS  1  . traMADol (ULTRAM) 50 MG tablet Take 1 tablet (50 mg total) by mouth every 6 (six) hours as needed. 90 tablet 5  . varenicline (CHANTIX CONTINUING MONTH PAK) 1 MG tablet Take 1 tablet (1 mg total) by mouth 2 (two) times daily. 60 tablet 3  . varenicline (CHANTIX STARTING MONTH PAK) 0.5 MG X 11 & 1 MG X 42 tablet Take one 0.5 mg tablet by mouth once daily for 3 days, then increase to one 0.5 mg tablet twice daily for 4 days, then increase to one 1 mg tablet twice daily. 53 tablet 0  . vitamin E 400 UNIT capsule Take 400 Units by mouth daily.    . benzonatate (TESSALON) 100 MG capsule Take 1 capsule (100 mg total) by mouth 3 (three) times daily as needed. 20 capsule 0  . doxycycline (VIBRA-TABS) 100 MG tablet Take 1 tablet (100 mg total) by mouth 2 (two) times daily. 20 tablet 0  . predniSONE (DELTASONE) 20 MG tablet Take 1 tablet (20 mg total)  by mouth daily with breakfast. 5 tablet 0   No current facility-administered medications for this visit.     Allergies: Cephalexin and Indocin [indomethacin]  Past Medical History:  Diagnosis Date  . Arthritis   . CAD (coronary artery disease)   . Frequent urination   . GERD (gastroesophageal reflux disease)   . Hyperlipidemia   . Hypertension   . Hypertrophy of prostate with urinary obstruction and other lower urinary tract symptoms (LUTS)   . Low back pain   . Low HDL (under 40)   . Shortness of breath dyspnea    with ambulation  . Skin cancer    removed from face    Past Surgical History:  Procedure Laterality Date  . ANTERIOR LAT LUMBAR FUSION  04/07/2012   Procedure: ANTERIOR LATERAL LUMBAR FUSION 2 LEVELS;  Surgeon: Eustace Moore, MD;   Location: [facility];  Service: Neurosurgery;  Laterality: Right;  Lumbar three-four and four-five extreme lumbar interbody fusion  . BACK SURGERY    . CARDIAC CATHETERIZATION    . COLONOSCOPY W/ POLYPECTOMY    . EYE SURGERY     tightened muscles in right eye  . TONSILLECTOMY      Family History  Problem Relation Age of Onset  . Alcohol abuse Other   . Drug abuse Other   . Arthritis Other   . Hypertension Other     Social History   Tobacco Use  . Smoking status: Current Some Day Smoker    Types: Cigarettes    Last attempt to quit: 08/16/2006    Years since quitting: 11.2  . Smokeless tobacco: Never Used  Substance Use Topics  . Alcohol use: Yes    Comment: Rare    Subjective:  Presents with 10 day history of cough/ congestion; +productive cough; not able to sleep at night due to cough- has tried Hydrocodone cough syrup with no benefit; wife is sick with similar symptoms; denies any chest pain or shortness of breath;    Objective:  Vitals:   11/30/17 1431  BP: 128/70  Pulse: 88  Temp: 98.1 F (36.7 C)  TempSrc: Oral  SpO2: 95%  Weight: 258 lb 1.3 oz (117.1 kg)  Height: 6\' 2"  (1.88 m)    General: Well developed, well nourished, in no acute distress  Skin : Warm and dry.  Head: Normocephalic and atraumatic  Eyes: Sclera and conjunctiva clear; pupils round and reactive to light; extraocular movements intact  Ears: External normal; canals clear; tympanic membranes normal  Oropharynx: Pink, supple. No suspicious lesions  Neck: Supple without thyromegaly, adenopathy  Lungs: Respirations unlabored; after nebulizer treatment, clear to auscultation bilaterally without wheeze, rales, rhonchi  CVS exam: normal rate and regular rhythm.  Neurologic: Alert and oriented; speech intact; face symmetrical; moves all extremities well; CNII-XII intact without focal deficit  Assessment:  1. Acute bronchitis, unspecified organism   2. Wheezing     Plan:  Albuterol nebulizer given in  office; Rx for Doxycycline 100 mg bid x 10 days, Prednisone 20 mg qd x 5 days, Tessalon Perles; increase fluids, rest and follow-up worse, no better.   No follow-ups on file.  No orders of the defined types were placed in this encounter.   Requested Prescriptions   Signed Prescriptions Disp Refills  . doxycycline (VIBRA-TABS) 100 MG tablet 20 tablet 0    Sig: Take 1 tablet (100 mg total) by mouth 2 (two) times daily.  . predniSONE (DELTASONE) 20 MG tablet 5 tablet 0  Sig: Take 1 tablet (20 mg total) by mouth daily with breakfast.  . benzonatate (TESSALON) 100 MG capsule 20 capsule 0    Sig: Take 1 capsule (100 mg total) by mouth 3 (three) times daily as needed.

## 2017-12-15 ENCOUNTER — Other Ambulatory Visit: Payer: Self-pay | Admitting: Internal Medicine

## 2017-12-15 DIAGNOSIS — I10 Essential (primary) hypertension: Secondary | ICD-10-CM

## 2017-12-19 ENCOUNTER — Other Ambulatory Visit: Payer: Self-pay | Admitting: Internal Medicine

## 2017-12-19 DIAGNOSIS — Z720 Tobacco use: Secondary | ICD-10-CM

## 2018-01-31 ENCOUNTER — Telehealth: Payer: Self-pay | Admitting: Emergency Medicine

## 2018-01-31 NOTE — Telephone Encounter (Signed)
Called patient to schedule AWV. Patient declined at this time. 

## 2018-02-03 ENCOUNTER — Other Ambulatory Visit: Payer: Self-pay | Admitting: Internal Medicine

## 2018-02-03 DIAGNOSIS — N400 Enlarged prostate without lower urinary tract symptoms: Secondary | ICD-10-CM

## 2018-03-16 ENCOUNTER — Other Ambulatory Visit: Payer: Self-pay | Admitting: Internal Medicine

## 2018-03-16 DIAGNOSIS — I10 Essential (primary) hypertension: Secondary | ICD-10-CM

## 2018-04-12 ENCOUNTER — Ambulatory Visit: Payer: Medicare Other | Admitting: Internal Medicine

## 2018-04-13 ENCOUNTER — Other Ambulatory Visit: Payer: Self-pay | Admitting: Internal Medicine

## 2018-04-13 DIAGNOSIS — I700 Atherosclerosis of aorta: Secondary | ICD-10-CM

## 2018-04-13 DIAGNOSIS — E785 Hyperlipidemia, unspecified: Secondary | ICD-10-CM

## 2018-04-13 DIAGNOSIS — I2510 Atherosclerotic heart disease of native coronary artery without angina pectoris: Secondary | ICD-10-CM

## 2018-04-26 ENCOUNTER — Other Ambulatory Visit: Payer: Self-pay | Admitting: Internal Medicine

## 2018-04-26 DIAGNOSIS — M25551 Pain in right hip: Secondary | ICD-10-CM

## 2018-04-26 DIAGNOSIS — M150 Primary generalized (osteo)arthritis: Principal | ICD-10-CM

## 2018-04-26 DIAGNOSIS — M159 Polyosteoarthritis, unspecified: Secondary | ICD-10-CM

## 2018-04-26 DIAGNOSIS — M25552 Pain in left hip: Secondary | ICD-10-CM

## 2018-05-02 ENCOUNTER — Other Ambulatory Visit: Payer: Self-pay

## 2018-05-02 ENCOUNTER — Observation Stay (HOSPITAL_COMMUNITY)
Admission: EM | Admit: 2018-05-02 | Discharge: 2018-05-04 | Disposition: A | Discharge status: Home / Self Care | Payer: Medicare Other | Attending: Internal Medicine | Admitting: Internal Medicine

## 2018-05-02 ENCOUNTER — Encounter (HOSPITAL_COMMUNITY): Payer: Self-pay

## 2018-05-02 ENCOUNTER — Encounter: Payer: Self-pay | Admitting: Internal Medicine

## 2018-05-02 ENCOUNTER — Ambulatory Visit (INDEPENDENT_AMBULATORY_CARE_PROVIDER_SITE_OTHER): Payer: Medicare Other | Admitting: Internal Medicine

## 2018-05-02 ENCOUNTER — Emergency Department (HOSPITAL_COMMUNITY): Payer: Medicare Other

## 2018-05-02 VITALS — BP 148/52 | HR 111 | Temp 98.2°F | Ht 74.0 in | Wt 252.0 lb

## 2018-05-02 DIAGNOSIS — Z8249 Family history of ischemic heart disease and other diseases of the circulatory system: Secondary | ICD-10-CM | POA: Diagnosis not present

## 2018-05-02 DIAGNOSIS — M1990 Unspecified osteoarthritis, unspecified site: Secondary | ICD-10-CM | POA: Insufficient documentation

## 2018-05-02 DIAGNOSIS — F17210 Nicotine dependence, cigarettes, uncomplicated: Secondary | ICD-10-CM | POA: Diagnosis not present

## 2018-05-02 DIAGNOSIS — L8990 Pressure ulcer of unspecified site, unspecified stage: Secondary | ICD-10-CM

## 2018-05-02 DIAGNOSIS — I10 Essential (primary) hypertension: Secondary | ICD-10-CM

## 2018-05-02 DIAGNOSIS — Z9889 Other specified postprocedural states: Secondary | ICD-10-CM | POA: Diagnosis not present

## 2018-05-02 DIAGNOSIS — Z881 Allergy status to other antibiotic agents status: Secondary | ICD-10-CM | POA: Insufficient documentation

## 2018-05-02 DIAGNOSIS — Z23 Encounter for immunization: Secondary | ICD-10-CM | POA: Diagnosis not present

## 2018-05-02 DIAGNOSIS — I110 Hypertensive heart disease with heart failure: Secondary | ICD-10-CM | POA: Insufficient documentation

## 2018-05-02 DIAGNOSIS — Z85828 Personal history of other malignant neoplasm of skin: Secondary | ICD-10-CM | POA: Diagnosis not present

## 2018-05-02 DIAGNOSIS — E876 Hypokalemia: Secondary | ICD-10-CM | POA: Insufficient documentation

## 2018-05-02 DIAGNOSIS — R0602 Shortness of breath: Secondary | ICD-10-CM

## 2018-05-02 DIAGNOSIS — Z8261 Family history of arthritis: Secondary | ICD-10-CM | POA: Diagnosis not present

## 2018-05-02 DIAGNOSIS — I25110 Atherosclerotic heart disease of native coronary artery with unstable angina pectoris: Secondary | ICD-10-CM | POA: Diagnosis not present

## 2018-05-02 DIAGNOSIS — I5030 Unspecified diastolic (congestive) heart failure: Secondary | ICD-10-CM | POA: Diagnosis not present

## 2018-05-02 DIAGNOSIS — K219 Gastro-esophageal reflux disease without esophagitis: Secondary | ICD-10-CM | POA: Diagnosis not present

## 2018-05-02 DIAGNOSIS — E118 Type 2 diabetes mellitus with unspecified complications: Secondary | ICD-10-CM

## 2018-05-02 DIAGNOSIS — R9431 Abnormal electrocardiogram [ECG] [EKG]: Secondary | ICD-10-CM | POA: Diagnosis present

## 2018-05-02 DIAGNOSIS — I119 Hypertensive heart disease without heart failure: Secondary | ICD-10-CM | POA: Diagnosis present

## 2018-05-02 DIAGNOSIS — I200 Unstable angina: Secondary | ICD-10-CM | POA: Insufficient documentation

## 2018-05-02 DIAGNOSIS — Z79899 Other long term (current) drug therapy: Secondary | ICD-10-CM | POA: Insufficient documentation

## 2018-05-02 DIAGNOSIS — Z981 Arthrodesis status: Secondary | ICD-10-CM | POA: Insufficient documentation

## 2018-05-02 DIAGNOSIS — Z955 Presence of coronary angioplasty implant and graft: Secondary | ICD-10-CM | POA: Insufficient documentation

## 2018-05-02 DIAGNOSIS — N400 Enlarged prostate without lower urinary tract symptoms: Secondary | ICD-10-CM | POA: Diagnosis not present

## 2018-05-02 DIAGNOSIS — I2510 Atherosclerotic heart disease of native coronary artery without angina pectoris: Secondary | ICD-10-CM | POA: Diagnosis present

## 2018-05-02 DIAGNOSIS — E785 Hyperlipidemia, unspecified: Secondary | ICD-10-CM | POA: Diagnosis not present

## 2018-05-02 DIAGNOSIS — R0609 Other forms of dyspnea: Secondary | ICD-10-CM | POA: Diagnosis not present

## 2018-05-02 DIAGNOSIS — R072 Precordial pain: Secondary | ICD-10-CM | POA: Diagnosis not present

## 2018-05-02 DIAGNOSIS — Z7982 Long term (current) use of aspirin: Secondary | ICD-10-CM | POA: Diagnosis not present

## 2018-05-02 DIAGNOSIS — Z888 Allergy status to other drugs, medicaments and biological substances status: Secondary | ICD-10-CM | POA: Insufficient documentation

## 2018-05-02 DIAGNOSIS — R079 Chest pain, unspecified: Secondary | ICD-10-CM | POA: Diagnosis present

## 2018-05-02 LAB — COMPREHENSIVE METABOLIC PANEL
ALK PHOS: 78 U/L (ref 38–126)
ALT: 27 U/L (ref 0–44)
AST: 20 U/L (ref 15–41)
Albumin: 3.8 g/dL (ref 3.5–5.0)
Anion gap: 11 (ref 5–15)
BILIRUBIN TOTAL: 0.9 mg/dL (ref 0.3–1.2)
BUN: 17 mg/dL (ref 8–23)
CALCIUM: 9.2 mg/dL (ref 8.9–10.3)
CO2: 28 mmol/L (ref 22–32)
CREATININE: 1.37 mg/dL — AB (ref 0.61–1.24)
Chloride: 98 mmol/L (ref 98–111)
GFR calc Af Amer: 56 mL/min — ABNORMAL LOW (ref >60)
GFR, EST NON AFRICAN AMERICAN: 49 mL/min — AB (ref >60)
Glucose, Bld: 153 mg/dL — ABNORMAL HIGH (ref 70–99)
Potassium: 3.3 mmol/L — ABNORMAL LOW (ref 3.5–5.1)
Sodium: 137 mmol/L (ref 135–145)
TOTAL PROTEIN: 6.7 g/dL (ref 6.5–8.1)

## 2018-05-02 LAB — CBC
HCT: 48.9 % (ref 39.0–52.0)
HCT: 48.1 % (ref 39.0–52.0)
HEMOGLOBIN: 16.8 g/dL (ref 13.0–17.0)
Hemoglobin: 16.5 g/dL (ref 13.0–17.0)
MCH: 31.1 pg (ref 26.0–34.0)
MCH: 31.9 pg (ref 26.0–34.0)
MCHC: 33.7 g/dL (ref 30.0–36.0)
MCHC: 34.9 g/dL (ref 30.0–36.0)
MCV: 92.3 fL (ref 78.0–100.0)
MCV: 91.3 fL (ref 78.0–100.0)
PLATELETS: 185 10*3/uL (ref 150–400)
Platelets: 171 10*3/uL (ref 150–400)
RBC: 5.3 MIL/uL (ref 4.22–5.81)
RBC: 5.27 MIL/uL (ref 4.22–5.81)
RDW: 12.2 % (ref 11.5–15.5)
RDW: 12.1 % (ref 11.5–15.5)
WBC: 7.7 10*3/uL (ref 4.0–10.5)
WBC: 7.4 10*3/uL (ref 4.0–10.5)

## 2018-05-02 LAB — CBG MONITORING, ED: Glucose-Capillary: 125 mg/dL — ABNORMAL HIGH (ref 70–99)

## 2018-05-02 LAB — CREATININE, SERUM
Creatinine, Ser: 1.22 mg/dL (ref 0.61–1.24)
GFR calc Af Amer: >60 mL/min (ref >60)
GFR calc non Af Amer: 56 mL/min — ABNORMAL LOW (ref >60)

## 2018-05-02 LAB — TROPONIN I: Troponin I: <0.03 ng/mL (ref <0.03)

## 2018-05-02 LAB — D-DIMER, QUANTITATIVE: D-Dimer, Quant: <0.27 ug/mL-FEU (ref 0.00–0.50)

## 2018-05-02 MED ORDER — AMLODIPINE BESYLATE 5 MG PO TABS
5.0000 mg | ORAL_TABLET | Freq: Every day | ORAL | Status: DC
  Administered 2018-05-03 – 2018-05-04 (×2): 5 mg via ORAL
  Filled 2018-05-02 (×2): qty 1

## 2018-05-02 MED ORDER — POTASSIUM CHLORIDE CRYS ER 20 MEQ PO TBCR
80.0000 meq | EXTENDED_RELEASE_TABLET | Freq: Once | ORAL | Status: AC
  Administered 2018-05-02: 80 meq via ORAL
  Filled 2018-05-02: qty 4

## 2018-05-02 MED ORDER — SIMVASTATIN 40 MG PO TABS
40.0000 mg | ORAL_TABLET | Freq: Every day | ORAL | Status: DC
  Administered 2018-05-02 – 2018-05-03 (×2): 40 mg via ORAL
  Filled 2018-05-02 (×2): qty 1

## 2018-05-02 MED ORDER — METOPROLOL TARTRATE 50 MG PO TABS
50.0000 mg | ORAL_TABLET | Freq: Two times a day (BID) | ORAL | Status: DC
  Administered 2018-05-02 – 2018-05-04 (×3): 50 mg via ORAL
  Filled 2018-05-02 (×4): qty 1

## 2018-05-02 MED ORDER — ACETAMINOPHEN 325 MG PO TABS: 650.0000 mg | ORAL_TABLET | ORAL | Status: DC | PRN

## 2018-05-02 MED ORDER — SODIUM CHLORIDE 0.9 % IV SOLN
INTRAVENOUS | Status: DC
  Administered 2018-05-02: 23:00:00 via INTRAVENOUS

## 2018-05-02 MED ORDER — ASPIRIN 81 MG PO CHEW: 324.0000 mg | CHEWABLE_TABLET | Freq: Once | ORAL | Status: DC

## 2018-05-02 MED ORDER — ONDANSETRON HCL 4 MG/2ML IJ SOLN: 4.0000 mg | Freq: Four times a day (QID) | INTRAMUSCULAR | Status: DC | PRN

## 2018-05-02 MED ORDER — ASPIRIN EC 81 MG PO TBEC
81.0000 mg | DELAYED_RELEASE_TABLET | Freq: Every day | ORAL | Status: DC
  Administered 2018-05-03 – 2018-05-04 (×2): 81 mg via ORAL
  Filled 2018-05-02 (×2): qty 1

## 2018-05-02 MED ORDER — TAMSULOSIN HCL 0.4 MG PO CAPS
0.4000 mg | ORAL_CAPSULE | Freq: Every day | ORAL | Status: DC
  Administered 2018-05-03 – 2018-05-04 (×2): 0.4 mg via ORAL
  Filled 2018-05-02 (×2): qty 1

## 2018-05-02 MED ORDER — NITROGLYCERIN 0.4 MG SL SUBL: 0.4000 mg | SUBLINGUAL_TABLET | SUBLINGUAL | Status: DC | PRN

## 2018-05-02 MED ORDER — HEPARIN SODIUM (PORCINE) 5000 UNIT/ML IJ SOLN
5000.0000 [IU] | Freq: Three times a day (TID) | INTRAMUSCULAR | Status: DC
  Administered 2018-05-02 – 2018-05-03 (×2): 5000 [IU] via SUBCUTANEOUS
  Filled 2018-05-02 (×2): qty 1

## 2018-05-02 NOTE — ED Notes (Signed)
Patient transported to X-ray 

## 2018-05-02 NOTE — ED Triage Notes (Signed)
[facility] reports pt at doc office, called for abnormal ekg/ STEM? 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  152/70 3/10 CP 93-94% RA Pt given 324 mg ASA and .4 NTG. 110/70 0/10 CP 95-96% on 2lpm

## 2018-05-02 NOTE — Progress Notes (Signed)
Subjective:  Patient ID: Jack Farmer, male    DOB: Sep 16, 1940  Age: 77 y.o. MRN: [id number]  CC: Chest Pain   HPI Jack Farmer presents for a one-week history of chest pain, shortness of breath, DOE, diaphoresis, and fatigue.  Outpatient Medications Prior to Visit  Medication Sig Dispense Refill  . amLODipine (NORVASC) 5 MG tablet TAKE 1 TABLET BY MOUTH EVERY DAY 90 tablet 0  . Ascorbic Acid (VITAMIN C) 1000 MG tablet Take 1,000 mg by mouth daily.    Marland Kitchen aspirin EC 81 MG tablet Take 81 mg by mouth daily.    Marland Kitchen b complex vitamins tablet Take 1 tablet by mouth daily.    Marland Kitchen BESIVANCE 0.6 % SUSP INSTILL 1 DROP INTO RIGHT EYE 3 TIMES A DAY  1  . chlorthalidone (HYGROTON) 25 MG tablet TAKE 1 TABLET EVERY DAY 90 tablet 1  . clobetasol ointment (TEMOVATE) 3.35 % Apply 1 application topically 2 (two) times daily. 45 g 1  . DUREZOL 0.05 % EMUL INSTILL 1 DROP INTO RIGHT EYE 3 TIMES A DAY  1  . metoprolol (LOPRESSOR) 50 MG tablet TAKE 1 TABLET (50 MG TOTAL) BY MOUTH 2 (TWO) TIMES DAILY. 180 tablet 3  . Omega-3 Fatty Acids (FISH OIL) 1000 MG CAPS Take 1,000 mg by mouth daily.    Marland Kitchen PROLENSA 0.07 % SOLN INSTILL 1 DROP INTO RIGHT EYE AT BEDTIME  1  . simvastatin (ZOCOR) 40 MG tablet TAKE 1 TABLET BY MOUTH EVERY DAY IN THE EVENING 90 tablet 1  . tamsulosin (FLOMAX) 0.4 MG CAPS capsule TAKE 1 CAPSULE (0.4 MG TOTAL) BY MOUTH DAILY. OVERDUE FOR YEARLY PHYSICAL MUST SEE MD FOR REFILLS 30 capsule 0  . telmisartan (MICARDIS) 40 MG tablet TAKE 1 TABLET EVERY DAY 90 tablet 1  . tiZANidine (ZANAFLEX) 4 MG tablet TAKE 1 TABLET BY MOUTH AT BEDTIME AS NEEDED FOR SPASMS  1  . traMADol (ULTRAM) 50 MG tablet TAKE 1 TABLET BY MOUTH EVERY 6 HOURS AS NEEDED 75 tablet 3  . varenicline (CHANTIX CONTINUING MONTH PAK) 1 MG tablet Take 1 tablet (1 mg total) by mouth 2 (two) times daily. 60 tablet 3  . vitamin E 400 UNIT capsule Take 400 Units by mouth daily.    . benzonatate (TESSALON) 100 MG capsule Take 1  capsule (100 mg total) by mouth 3 (three) times daily as needed. 20 capsule 0  . doxycycline (VIBRA-TABS) 100 MG tablet Take 1 tablet (100 mg total) by mouth 2 (two) times daily. 20 tablet 0  . predniSONE (DELTASONE) 20 MG tablet Take 1 tablet (20 mg total) by mouth daily with breakfast. 5 tablet 0  . tamsulosin (FLOMAX) 0.4 MG CAPS capsule TAKE ONE CAPSULE BY MOUTH EVERY DAY *NEEDS APPT 90 capsule 1  . varenicline (CHANTIX STARTING MONTH PAK) 0.5 MG X 11 & 1 MG X 42 tablet TAKE AS DIRECTED 53 tablet 0   No facility-administered medications prior to visit.     ROS Review of Systems  Constitutional: Positive for diaphoresis and fatigue.  HENT: Negative.   Eyes: Negative.   Respiratory: Positive for chest tightness and shortness of breath. Negative for wheezing.   Cardiovascular: Positive for chest pain. Negative for palpitations and leg swelling.  Gastrointestinal: Positive for nausea. Negative for abdominal pain, constipation, diarrhea and vomiting.  Endocrine: Negative.   Genitourinary: Negative.  Negative for difficulty urinating.  Musculoskeletal: Negative.   Skin: Negative.   Neurological: Negative.  Negative for dizziness, weakness and  light-headedness.  Hematological: Negative for adenopathy. Does not bruise/bleed easily.  Psychiatric/Behavioral: Negative.     Objective:  BP (!) 148/52 (BP Location: Left Arm, Patient Position: Sitting, Cuff Size: Large)   Pulse (!) 111   Temp 98.2 F (36.8 C) (Oral)   Ht 6\' 2"  (1.88 m)   Wt 252 lb (114.3 kg)   SpO2 95%   BMI 32.35 kg/m   BP Readings from Last 3 Encounters:  05/02/18 (!) 148/52  11/30/17 128/70  11/08/17 138/84    Wt Readings from Last 3 Encounters:  05/02/18 252 lb (114.3 kg)  11/30/17 258 lb 1.3 oz (117.1 kg)  11/08/17 259 lb (117.5 kg)    Physical Exam  Constitutional: He is oriented to person, place, and time. He appears distressed.  HENT:  Mouth/Throat: Oropharynx is clear and moist. No oropharyngeal  exudate.  Eyes: Conjunctivae are normal. No scleral icterus.  Neck: Normal range of motion. Neck supple. No thyromegaly present.  Cardiovascular: Normal rate, regular rhythm and normal heart sounds.  Occasional extrasystoles are present. Exam reveals no gallop and no friction rub.  No murmur heard. EKG ---  Sinus  Rhythm  - occasional PAC    # PACs = 1. -ST depression  -consider subendocardial injury/ischemia.   ABNORMAL - changes noted with subtle ST depression in anterior lateral leads.   Pulmonary/Chest: Effort normal and breath sounds normal. No respiratory distress. He has no wheezes. He has no rales.  Abdominal: Soft. Bowel sounds are normal. He exhibits no mass. There is no tenderness.  Musculoskeletal: Normal range of motion. He exhibits no edema, tenderness or deformity.  Lymphadenopathy:    He has no cervical adenopathy.  Neurological: He is alert and oriented to person, place, and time.  Skin: Skin is warm. No rash noted. He is diaphoretic.    Lab Results  Component Value Date   WBC 6.9 10/26/2017   HGB 16.6 10/26/2017   HCT 47.6 10/26/2017   PLT 187.0 10/26/2017   GLUCOSE 113 (H) 10/26/2017   CHOL 86 10/26/2017   TRIG 253.0 (H) 10/26/2017   HDL 27.20 (L) 10/26/2017   LDLDIRECT 46.0 10/26/2017   LDLCALC 33 01/19/2016   ALT 27 10/26/2017   AST 17 10/26/2017   NA 133 (L) 10/26/2017   K 3.8 10/26/2017   CL 96 10/26/2017   CREATININE 1.26 10/26/2017   BUN 19 10/26/2017   CO2 31 10/26/2017   TSH 1.42 01/19/2016   PSA 1.16 01/19/2016   INR 0.96 09/10/2014   HGBA1C 6.5 10/26/2017    Dg Hips Bilat With Pelvis 3-4 Views  Result Date: 10/27/2017 CLINICAL DATA:  Bilateral hip pain for several months, initial encounter EXAM: DG HIP (WITH OR WITHOUT PELVIS) 4V BILAT COMPARISON:  None. FINDINGS: The pelvic ring is intact. Mild degenerative changes of the hip joints are noted bilaterally. Postsurgical changes in the lower lumbar spine are seen. No acute fracture or  dislocation is noted. No soft tissue changes are seen. IMPRESSION: Mild degenerative change without acute abnormality. Electronically Signed   By: Inez Catalina M.D.   On: 10/27/2017 08:34    Assessment & Plan:   Jaideep was seen today for chest pain.  Diagnoses and all orders for this visit:  Type 2 diabetes mellitus with complication, without long-term current use of insulin ([facility]CC)  Essential hypertension  Precordial pain -     EKG 12-Lead  Dyspnea on exertion- Based on his symptoms and EKG changes it looks like he is having subendocardial ischemia.  [facility] is transferred to [facility] via [facility]. -     EKG 12-Lead  SOB (shortness of breath) -     EKG 12-Lead  Need for influenza vaccination -     Flu vaccine HIGH DOSE PF (Fluzone High dose)   I have discontinued Thayer Jew B. Thorburn Jr's doxycycline, predniSONE, and benzonatate. I am also having him maintain his aspirin EC, vitamin C, vitamin E, b complex vitamins, Fish Oil, tamsulosin, metoprolol tartrate, clobetasol ointment, PROLENSA, DUREZOL, tiZANidine, BESIVANCE, varenicline, chlorthalidone, telmisartan, amLODipine, simvastatin, and traMADol.  No orders of the defined types were placed in this encounter.    Follow-up: No follow-ups on file.  Scarlette Calico, MD

## 2018-05-02 NOTE — Patient Instructions (Signed)
Nonspecific Chest Pain °Chest pain can be caused by many different conditions. There is always a chance that your pain could be related to something serious, such as a heart attack or a blood clot in your lungs. Chest pain can also be caused by conditions that are not life-threatening. If you have chest pain, it is very important to follow up with your health care provider. °What are the causes? °Causes of this condition include: °· Heartburn. °· Pneumonia or bronchitis. °· Anxiety or stress. °· Inflammation around your heart (pericarditis) or lung (pleuritis or pleurisy). °· A blood clot in your lung. °· A collapsed lung (pneumothorax). This can develop suddenly on its own (spontaneous pneumothorax) or from trauma to the chest. °· Shingles infection (varicella-zoster virus). °· Heart attack. °· Damage to the bones, muscles, and cartilage that make up your chest wall. This can include: °? Bruised bones due to injury. °? Strained muscles or cartilage due to frequent or repeated coughing or overwork. °? Fracture to one or more ribs. °? Sore cartilage due to inflammation (costochondritis). ° °What increases the risk? °Risk factors for this condition may include: °· Activities that increase your risk for trauma or injury to your chest. °· Respiratory infections or conditions that cause frequent coughing. °· Medical conditions or overeating that can cause heartburn. °· Heart disease or family history of heart disease. °· Conditions or health behaviors that increase your risk of developing a blood clot. °· Having had chicken pox (varicella zoster). ° °What are the signs or symptoms? °Chest pain can feel like: °· Burning or tingling on the surface of your chest or deep in your chest. °· Crushing, pressure, aching, or squeezing pain. °· Dull or sharp pain that is worse when you move, cough, or take a deep breath. °· Pain that is also felt in your back, neck, shoulder, or arm, or pain that spreads to any of these  areas. ° °Your chest pain may come and go, or it may stay constant. °How is this diagnosed? °Lab tests or other studies may be needed to find the cause of your pain. Your health care provider may have you take a test called an ECG (electrocardiogram). An ECG records your heartbeat patterns at the time the test is performed. You may also have other tests, such as: °· Transthoracic echocardiogram (TTE). In this test, sound waves are used to create a picture of the heart structures and to look at how blood flows through your heart. °· Transesophageal echocardiogram (TEE). This is a more advanced imaging test that takes images from inside your body. It allows your health care provider to see your heart in finer detail. °· Cardiac monitoring. This allows your health care provider to monitor your heart rate and rhythm in real time. °· Holter monitor. This is a portable device that records your heartbeat and can help to diagnose abnormal heartbeats. It allows your health care provider to track your heart activity for several days, if needed. °· Stress tests. These can be done through exercise or by taking medicine that makes your heart beat more quickly. °· Blood tests. °· Other imaging tests. ° °How is this treated? °Treatment depends on what is causing your chest pain. Treatment may include: °· Medicines. These may include: °? Acid blockers for heartburn. °? Anti-inflammatory medicine. °? Pain medicine for inflammatory conditions. °? Antibiotic medicine, if an infection is present. °? Medicines to dissolve blood clots. °? Medicines to treat coronary artery disease (CAD). °· Supportive care for conditions that   do not require medicines. This may include: °? Resting. °? Applying heat or cold packs to injured areas. °? Limiting activities until pain decreases. ° °Follow these instructions at home: °Medicines °· If you were prescribed an antibiotic, take it as told by your health care provider. Do not stop taking the  antibiotic even if you start to feel better. °· Take over-the-counter and prescription medicines only as told by your health care provider. °Lifestyle °· Do not use any products that contain nicotine or tobacco, such as cigarettes and e-cigarettes. If you need help quitting, ask your health care provider. °· Do not drink alcohol. °· Make lifestyle changes as directed by your health care provider. These may include: °? Getting regular exercise. Ask your health care provider to suggest some activities that are safe for you. °? Eating a heart-healthy diet. A registered dietitian can help you to learn healthy eating options. °? Maintaining a healthy weight. °? Managing diabetes, if necessary. °? Reducing stress, such as with yoga or relaxation techniques. °General instructions °· Avoid any activities that bring on chest pain. °· If heartburn is the cause for your chest pain, raise (elevate) the head of your bed about 6 inches (15 cm) by putting blocks under the legs. Sleeping with more pillows does not effectively relieve heartburn because it only changes the position of your head. °· Keep all follow-up visits as told by your health care provider. This is important. This includes any further testing if your chest pain does not go away. °Contact a health care provider if: °· Your chest pain does not go away. °· You have a rash with blisters on your chest. °· You have a fever. °· You have chills. °Get help right away if: °· Your chest pain is worse. °· You have a cough that gets worse, or you cough up blood. °· You have severe pain in your abdomen. °· You have severe weakness. °· You faint. °· You have sudden, unexplained chest discomfort. °· You have sudden, unexplained discomfort in your arms, back, neck, or jaw. °· You have shortness of breath at any time. °· You suddenly start to sweat, or your skin gets clammy. °· You feel nauseous or you vomit. °· You suddenly feel light-headed or dizzy. °· Your heart begins to beat  quickly, or it feels like it is skipping beats. °These symptoms may represent a serious problem that is an emergency. Do not wait to see if the symptoms will go away. Get medical help right away. Call your local emergency services (911 in the [place] Do not drive yourself to the hospital. °This information is not intended to replace advice given to you by your health care provider. Make sure you discuss any questions you have with your health care provider. °Document Released: 05/12/2005 Document Revised: 04/26/2016 Document Reviewed: 04/26/2016 °Elsevier Interactive Patient Education © 2017 [employer] ° °

## 2018-05-02 NOTE — ED Notes (Signed)
CBG collected. Result "125." RN, Domingo Mend, notified.

## 2018-05-02 NOTE — H&P (Signed)
ADMISSION HISTORY & PHYSICAL  Patient Name: Jack Farmer Date of Encounter: 05/02/2018 Cardiologist: Kirk Ruths, MD  Chief Complaint   Chest pain/dyspnea  Patient Profile   This is a 77 yo male patient of Dr. Stanford Breed with HTN, HLD, and CAD who presents for evaluation of chest pain/dyspnea.  HPI   Jack Farmer is a 77 y.o. male who is being seen today for the evaluation of chest pain and dyspnea at the request of Dr. Dayna Barker. Jack Farmer is a patient of Dr. Stanford Breed with a history of HTN, HLD and CAD. He was last seen in 2014, but not recently. He had a heart cath in 2008 which demonstrated 20% proximal LAD disease, 40-50% circumflex disease and 50% RCA stenosis. LVEF was 65%. He has been managed medically and does not seem to have had interim cardiac events. In 11/2016, he was seen by his PCP Dr. Ronnald Ramp and had a stress test for a "vague episode" of chest pain. This was negative for ischemia with LVEF 51%. Today he presents with chest pain to his PCP office, associated with DOE, diaphoresis and fatigue. An EKG suggested mild lateral ST depression (but was read as subendocardial ischemia) - I personally reviewed it and he was transferred to [facility] ER for evaluation. EKG compared to prior study in 2016 when he arrived in the ER does not show ischemic changes. Blood pressure is mildly elevated.  Labs indicate mildly elevated creatinine, mild hypokalemia 3.3 and are otherwise normal. Troponin negative x 1. D-dimer is negative. CXR shows NAD.  PMHx   Past Medical History:  Diagnosis Date  . Arthritis   . CAD (coronary artery disease)   . Frequent urination   . GERD (gastroesophageal reflux disease)   . Hyperlipidemia   . Hypertension   . Hypertrophy of prostate with urinary obstruction and other lower urinary tract symptoms (LUTS)   . Low back pain   . Low HDL (under 40)   . Shortness of breath dyspnea    with ambulation  . Skin cancer    removed from face    Past  Surgical History:  Procedure Laterality Date  . ANTERIOR LAT LUMBAR FUSION  04/07/2012   Procedure: ANTERIOR LATERAL LUMBAR FUSION 2 LEVELS;  Surgeon: Eustace Moore, MD;  Location: [facility];  Service: Neurosurgery;  Laterality: Right;  Lumbar three-four and four-five extreme lumbar interbody fusion  . BACK SURGERY    . CARDIAC CATHETERIZATION    . COLONOSCOPY W/ POLYPECTOMY    . EYE SURGERY     tightened muscles in right eye  . TONSILLECTOMY      FAMHx   Family History  Problem Relation Age of Onset  . Alcohol abuse Other   . Drug abuse Other   . Arthritis Other   . Hypertension Other     SOCHx    reports that he has been smoking cigarettes. He has never used smokeless tobacco. He reports that he drinks alcohol. He reports that he does not use drugs.  Outpatient Medications   No current facility-administered medications on file prior to encounter.    Current Outpatient Medications on File Prior to Encounter  Medication Sig Dispense Refill  . amLODipine (NORVASC) 5 MG tablet TAKE 1 TABLET BY MOUTH EVERY DAY (Patient taking differently: Take 5 mg by mouth daily. ) 90 tablet 0  . Ascorbic Acid (VITAMIN C) 1000 MG tablet Take 1,000 mg by mouth daily.    Marland Kitchen aspirin EC 81 MG  tablet Take 81 mg by mouth daily.    Marland Kitchen b complex vitamins tablet Take 1 tablet by mouth daily.    . chlorthalidone (HYGROTON) 25 MG tablet TAKE 1 TABLET EVERY DAY (Patient taking differently: Take 25 mg by mouth daily. ) 90 tablet 1  . Omega-3 Fatty Acids (FISH OIL) 1000 MG CAPS Take 1,000 mg by mouth daily.    . simvastatin (ZOCOR) 40 MG tablet TAKE 1 TABLET BY MOUTH EVERY DAY IN THE EVENING (Patient taking differently: Take 40 mg by mouth daily at 6 PM. ) 90 tablet 1  . tamsulosin (FLOMAX) 0.4 MG CAPS capsule TAKE 1 CAPSULE (0.4 MG TOTAL) BY MOUTH DAILY. OVERDUE FOR YEARLY PHYSICAL MUST SEE MD FOR REFILLS (Patient taking differently: Take 0.4 mg by mouth daily. ) 30 capsule 0  . telmisartan (MICARDIS) 40 MG  tablet TAKE 1 TABLET EVERY DAY (Patient taking differently: Take 40 mg by mouth daily. ) 90 tablet 1  . tiZANidine (ZANAFLEX) 4 MG tablet Take 4 mg by mouth at bedtime.   1  . traMADol (ULTRAM) 50 MG tablet TAKE 1 TABLET BY MOUTH EVERY 6 HOURS AS NEEDED (Patient taking differently: Take 50 mg by mouth every 6 (six) hours as needed. ) 75 tablet 3  . vitamin E 400 UNIT capsule Take 400 Units by mouth daily.    . clobetasol ointment (TEMOVATE) 5.00 % Apply 1 application topically 2 (two) times daily. (Patient not taking: Reported on 05/02/2018) 45 g 1  . metoprolol (LOPRESSOR) 50 MG tablet TAKE 1 TABLET (50 MG TOTAL) BY MOUTH 2 (TWO) TIMES DAILY. (Patient not taking: Reported on 05/02/2018) 180 tablet 3  . varenicline (CHANTIX CONTINUING MONTH PAK) 1 MG tablet Take 1 tablet (1 mg total) by mouth 2 (two) times daily. (Patient not taking: Reported on 05/02/2018) 60 tablet 3    Inpatient Medications    Scheduled Meds:   Continuous Infusions:   PRN Meds:    ALLERGIES   Allergies  Allergen Reactions  . Cephalexin Anaphylaxis  . Indocin [Indomethacin] Other (See Comments)     Gi bleed    ROS   Pertinent items noted in HPI and remainder of comprehensive ROS otherwise negative.  Vitals   Vitals:   05/02/18 1730 05/02/18 1745 05/02/18 1800 05/02/18 1815  BP: (!) 151/83 (!) 156/84 (!) 152/80 (!) 141/78  Pulse: 79 76 85 80  Resp: _0 Temp:      TempSrc:      SpO2: 97% 96% 95% 96%  Weight:      Height:       No intake or output data in the 24 hours ending 05/02/18 1913 Filed Weights   05/02/18 1555  Weight: 114 kg    Physical Exam   General appearance: alert and no distress Neck: no carotid bruit, no JVD and thyroid not enlarged, symmetric, no tenderness/mass/nodules Lungs: clear to auscultation bilaterally Heart: regular rate and rhythm Abdomen: soft, non-tender; bowel sounds normal; no masses,  no organomegaly Extremities: extremities normal, atraumatic, no  cyanosis or edema Pulses: 2+ and symmetric Skin: Skin color, texture, turgor normal. No rashes or lesions Neurologic: Grossly normal Psych: Pleasant  Labs   Results for orders placed or performed during the hospital encounter of 05/02/18 (from the past 48 hour(s))  Comprehensive metabolic panel     Status: Abnormal   Collection Time: 05/02/18  3:47 PM  Result Value Ref Range   Sodium 137 135 - 145 mmol/L   Potassium 3.3 (L)  3.5 - 5.1 mmol/L   Chloride 98 98 - 111 mmol/L   CO2 28 22 - 32 mmol/L   Glucose, Bld 153 (H) 70 - 99 mg/dL   BUN 17 8 - 23 mg/dL   Creatinine, Ser 1.37 (H) 0.61 - 1.24 mg/dL   Calcium 9.2 8.9 - 10.3 mg/dL   Total Protein 6.7 6.5 - 8.1 g/dL   Albumin 3.8 3.5 - 5.0 g/dL   AST 20 15 - 41 U/L   ALT 27 0 - 44 U/L   Alkaline Phosphatase 78 38 - 126 U/L   Total Bilirubin 0.9 0.3 - 1.2 mg/dL   GFR calc non Af Amer 49 (L) >60 mL/min   GFR calc Af Amer 56 (L) >60 mL/min    Comment: (NOTE) The eGFR has been calculated using the CKD EPI equation. This calculation has not been validated in all clinical situations. eGFR's persistently <60 mL/min signify possible Chronic Kidney Disease.    Anion gap 11 5 - 15    Comment: Performed at [facility] [address] [place], [place] [address]  Troponin I     Status: None   Collection Time: 05/02/18  3:47 PM  Result Value Ref Range   Troponin I <0.03 <0.03 ng/mL    Comment: Performed at [facility] [address] [place] [address]  CBC     Status: None   Collection Time: 05/02/18  3:47 PM  Result Value Ref Range   WBC 7.7 4.0 - 10.5 K/uL   RBC 5.30 4.22 - 5.81 MIL/uL   Hemoglobin 16.5 13.0 - 17.0 g/dL   HCT 48.9 39.0 - 52.0 %   MCV 92.3 78.0 - 100.0 fL   MCH 31.1 26.0 - 34.0 pg   MCHC 33.7 30.0 - 36.0 g/dL   RDW 12.2 11.5 - 15.5 %   Platelets 185 150 - 400 K/uL    Comment: Performed at [facility] [address] [place], [place] [address]  D-dimer, quantitative (not at [facility])      Status: None   Collection Time: 05/02/18  3:47 PM  Result Value Ref Range   D-Dimer, Quant <0.27 0.00 - 0.50 ug/mL-FEU    Comment: (NOTE) At the manufacturer cut-off of 0.50 ug/mL FEU, this assay has been documented to exclude PE with a sensitivity and negative predictive value of 97 to 99%.  At this time, this assay has not been approved by the FDA to exclude DVT/VTE. Results should be correlated with clinical presentation. Performed at [facility], [place] [address], [place],  [address]   CBG monitoring, ED     Status: Abnormal   Collection Time: 05/02/18  4:05 PM  Result Value Ref Range   Glucose-Capillary 125 (H) 70 - 99 mg/dL   Comment 1 Notify RN     ECG   Sinus rhythm without ischemic changes - Personally Reviewed  Telemetry   Sinus rhythm - Personally Reviewed  Radiology   Dg Chest 2 View  Result Date: 05/02/2018 CLINICAL DATA:  Chest pain EXAM: CHEST - 2 VIEW COMPARISON:  10/24/2014 FINDINGS: No focal opacity or pleural effusion. Cardiomediastinal silhouette within normal limits. Aortic atherosclerosis. No pneumothorax. Degenerative changes of the spine. IMPRESSION: No active cardiopulmonary disease. Electronically Signed   By: Donavan Foil M.D.   On: 05/02/2018 16:44    Cardiac Studies   N/A  Impression   Principal Problem:   Precordial chest pain Active Problems:   Hyperlipidemia with  target LDL less than 70   Essential hypertension   Coronary atherosclerosis   Abnormal electrocardiogram (ECG) (EKG)   Dyspnea on exertion   Recommendation   1. Jack Farmer describes chest pain concerning for unstable angina - EKG in the PCP office read as subendocardial ischemia, however, I don't appreciate this and his admit EKG was fairly normal. Initial troponin was negative. He appears some what dry on exam with dark urine, elevated creatinine and hypokalemia. Will admit for ACS rule-out. Start IVF's at 50 cc/hr and replete potassium with 80 MEQ po  potassium. If he rules-out overnight, would recommend a CT coronary angiogram tomorrow. Discussed this with the patient and his daughter's and they are agreeable to the plan.  Time Spent Directly with Patient:  I have spent a total of 45 minutes with the patient reviewing hospital notes, telemetry, EKGs, labs and examining the patient as well as establishing an assessment and plan that was discussed personally with the patient.  > 50% of time was spent in direct patient care.  Length of Stay:  LOS: 0 days   Pixie Casino, MD, [facility], [place] Director of the [facility] Lipid Disorders &  Cardiovascular Risk Reduction Clinic Diplomate of the American Board of Clinical Lipidology Attending Cardiologist  Direct Dial: [phone]  Fax: [phone]  Website:  [email or web address] 05/02/2018, 7:13 PM

## 2018-05-02 NOTE — ED Provider Notes (Addendum)
Emergency Department Provider Note   I have reviewed the triage vital signs and the nursing notes.   HISTORY  Chief Complaint Chest Pain   HPI Jack Farmer is a 77 y.o. male medical problems as documented below the presents to the emergency department today with exertional chest pain.  Patient states that he has had this multiple times in the past has been worked up multiple times with catheterizations, stress test nuclear medicine studies with the most recent nuclear medicine study done about a year ago.  He had a lot of stress recently with his wife being in rehab however he has been having dyspnea on exertion is been pretty severe at times.  No associated anxiety.  He will have some chest pain once in a while have some nausea and diaphoresis once in a while as well.  States these are similar to symptoms he had in the past when he has had negative stress and catheterizations.  Saw his primary doctor today and EKG was done to concern for subendocardial ischemia so called EMS and brought him here for further evaluation he is astigmatic at this time.  No recent cough he does still smoke.  States compliance with blood pressure medications.  No lower extremity swelling.  Review of EKGs from the EMS patient did have episode of tachycardia of 1 30-1 40 but seems to be sinus. No other associated or modifying symptoms.    Past Medical History:  Diagnosis Date  . Arthritis   . CAD (coronary artery disease)   . Frequent urination   . GERD (gastroesophageal reflux disease)   . Hyperlipidemia   . Hypertension   . Hypertrophy of prostate with urinary obstruction and other lower urinary tract symptoms (LUTS)   . Low back pain   . Low HDL (under 40)   . Shortness of breath dyspnea    with ambulation  . Skin cancer    removed from face    Patient Active Problem List   Diagnosis Date Noted  . Dyspnea on exertion 05/02/2018  . Chest pain 05/02/2018  . Trochanteric bursitis of both  hips 10/26/2017  . Tobacco abuse 10/26/2017  . Type 2 diabetes mellitus with complication, without long-term current use of insulin ([place]) 10/26/2017  . Abnormal electrocardiogram (ECG) (EKG) 04/11/2017  . Eczema 04/11/2017  . Unstable angina ([place]) 11/22/2016  . Primary insomnia 11/22/2016  . BPH (benign prostatic hyperplasia) 10/17/2014  . Atherosclerosis of aorta ([place]) 06/25/2013  . Abdominal bruit 06/19/2013  . Routine general medical examination at a health care facility 08/29/2011  . ERECTILE DYSFUNCTION 01/08/2010  . Hyperlipidemia with target LDL less than 70 12/30/2008  . GERD 12/30/2008  . Coronary atherosclerosis 12/26/2008  . Essential hypertension 09/04/2008  . Osteoarthritis 09/04/2008    Past Surgical History:  Procedure Laterality Date  . ANTERIOR LAT LUMBAR FUSION  04/07/2012   Procedure: ANTERIOR LATERAL LUMBAR FUSION 2 LEVELS;  Surgeon: Eustace Moore, MD;  Location: [facility];  Service: Neurosurgery;  Laterality: Right;  Lumbar three-four and four-five extreme lumbar interbody fusion  . BACK SURGERY    . CARDIAC CATHETERIZATION    . COLONOSCOPY W/ POLYPECTOMY    . EYE SURGERY     tightened muscles in right eye  . TONSILLECTOMY        Allergies Cephalexin and Indocin [indomethacin]  Family History  Problem Relation Age of Onset  . Alcohol abuse Other   . Drug abuse Other   . Arthritis Other   .  Hypertension Other     Social History Social History   Tobacco Use  . Smoking status: Current Some Day Smoker    Types: Cigarettes    Last attempt to quit: 08/16/2006    Years since quitting: 11.7  . Smokeless tobacco: Never Used  Substance Use Topics  . Alcohol use: Yes    Comment: Rare  . Drug use: No    Review of Systems  All other systems negative except as documented in the HPI. All pertinent positives and negatives as reviewed in the HPI. ____________________________________________   PHYSICAL EXAM:  VITAL SIGNS: ED Triage Vitals  [05/02/18 1524]  Enc Vitals Group     BP (!) 151/77     Pulse Rate 86     Resp 18     Temp 98.5 F (36.9 C)     Temp Source Oral     SpO2 96 %    Constitutional: Alert and oriented. Well appearing and in no acute distress. Eyes: Conjunctivae are normal. PERRL. EOMI. Head: Atraumatic. Nose: No congestion/rhinnorhea. Mouth/Throat: Mucous membranes are moist.  Oropharynx non-erythematous. Neck: No stridor.  No meningeal signs.   Cardiovascular: Normal rate, regular rhythm. Good peripheral circulation. Grossly normal heart sounds.   Respiratory: Normal respiratory effort.  No retractions. Lungs CTAB. Gastrointestinal: Soft and nontender. No distention.  Musculoskeletal: No lower extremity tenderness nor edema. No gross deformities of extremities. Neurologic:  Normal speech and language. No gross focal neurologic deficits are appreciated.  Skin:  Skin is warm, dry and intact. No rash noted.   ____________________________________________   LABS (all labs ordered are listed, but only abnormal results are displayed)  Labs Reviewed  COMPREHENSIVE METABOLIC PANEL - Abnormal; Notable for the following components:      Result Value   Potassium 3.3 (*)    Glucose, Bld 153 (*)    Creatinine, Ser 1.37 (*)    GFR calc non Af Amer 49 (*)    GFR calc Af Amer 56 (*)    All other components within normal limits  CREATININE, SERUM - Abnormal; Notable for the following components:   GFR calc non Af Amer 56 (*)    All other components within normal limits  CBG MONITORING, ED - Abnormal; Notable for the following components:   Glucose-Capillary 125 (*)    All other components within normal limits  TROPONIN I  CBC  D-DIMER, QUANTITATIVE (NOT AT [facility])  CBC  TROPONIN I  TROPONIN I  TROPONIN I  BASIC METABOLIC PANEL  LIPID PANEL   ____________________________________________  EKG   EKG Interpretation  Date/Time:  Tuesday May 02 2018 15:29:40 EDT Ventricular Rate:  88 PR  Interval:  136 QRS Duration: 94 QT Interval:  368 QTC Calculation: 445 R Axis:   13 Text Interpretation:  Sinus rhythm with marked sinus arrhythmia Otherwise normal ECG similar to january 2016 Confirmed by Merrily Pew [phone]) on 05/02/2018 5:50:37 PM       ____________________________________________  RADIOLOGY  Dg Chest 2 View  Result Date: 05/02/2018 CLINICAL DATA:  Chest pain EXAM: CHEST - 2 VIEW COMPARISON:  10/24/2014 FINDINGS: No focal opacity or pleural effusion. Cardiomediastinal silhouette within normal limits. Aortic atherosclerosis. No pneumothorax. Degenerative changes of the spine. IMPRESSION: No active cardiopulmonary disease. Electronically Signed   By: Donavan Foil M.D.   On: 05/02/2018 16:44    ____________________________________________   INITIAL IMPRESSION / ASSESSMENT AND PLAN / ED COURSE  Exertional dyspnea along with chest pain and intermittent diaphoresis is obviously concerning for acute coronary  syndrome however these are similar symptoms to multiple episodes in the past when he had clean caths and normal work-ups.  Other consideration with the tachycardia and mildly low oxygen is a possible pulmonary embolus.  Could also be pneumonia versus bronchitis as well.  Will start with ACS work-up and cardiology consultation if that is normal.  D-dimer as the patient is moderate risk for PE.  Also chest x-ray for disposition of dependent on cardiology consultation and results of labs.  Cardiology saw and will admit.   CRITICAL CARE Performed by: Merrily Pew Total critical care time: 35 minutes Critical care time was exclusive of separately billable procedures and treating other patients. Critical care was necessary to treat or prevent imminent or life-threatening deterioration. Critical care was time spent personally by me on the following activities: development of treatment plan with patient and/or surrogate as well as nursing, discussions with consultants,  evaluation of patient's response to treatment, examination of patient, obtaining history from patient or surrogate, ordering and performing treatments and interventions, ordering and review of laboratory studies, ordering and review of radiographic studies, pulse oximetry and re-evaluation of patient's condition.   Pertinent labs & imaging results that were available during my care of the patient were reviewed by me and considered in my medical decision making (see chart for details).  ____________________________________________  FINAL CLINICAL IMPRESSION(S) / ED DIAGNOSES  Final diagnoses:  Chest pain     MEDICATIONS GIVEN DURING THIS VISIT:  Medications  amLODipine (NORVASC) tablet 5 mg (has no administration in time range)  simvastatin (ZOCOR) tablet 40 mg (40 mg Oral Given 05/02/18 2244)  tamsulosin (FLOMAX) capsule 0.4 mg (has no administration in time range)  aspirin EC tablet 81 mg (has no administration in time range)  nitroGLYCERIN (NITROSTAT) SL tablet 0.4 mg (has no administration in time range)  acetaminophen (TYLENOL) tablet 650 mg (has no administration in time range)  ondansetron (ZOFRAN) injection 4 mg (has no administration in time range)  metoprolol tartrate (LOPRESSOR) tablet 50 mg (50 mg Oral Given 05/02/18 2244)  heparin injection 5,000 Units (5,000 Units Subcutaneous Given 05/02/18 2244)  0.9 %  sodium chloride infusion ( Intravenous New Bag/Given 05/02/18 2249)  potassium chloride SA (K-DUR,KLOR-CON) CR tablet 80 mEq (80 mEq Oral Given 05/02/18 2243)     NEW OUTPATIENT MEDICATIONS STARTED DURING THIS VISIT:  Current Discharge Medication List      Note:  This note was prepared with assistance of Dragon voice recognition software. Occasional wrong-word or sound-a-like substitutions may have occurred due to the inherent limitations of voice recognition software.   Akua Blethen, Corene Cornea, MD 05/02/18 3154    Merrily Pew, MD 05/25/18 1331

## 2018-05-02 NOTE — ED Notes (Signed)
Called charge about placement for this hallway pt. He will go in E45 as soon it's open

## 2018-05-03 ENCOUNTER — Observation Stay (HOSPITAL_BASED_OUTPATIENT_CLINIC_OR_DEPARTMENT_OTHER): Payer: Medicare Other

## 2018-05-03 ENCOUNTER — Observation Stay (HOSPITAL_COMMUNITY): Payer: Medicare Other

## 2018-05-03 ENCOUNTER — Encounter (HOSPITAL_COMMUNITY)
Admission: EM | Disposition: A | Discharge status: Home / Self Care | Payer: Self-pay | Source: Home / Self Care | Attending: Emergency Medicine

## 2018-05-03 ENCOUNTER — Encounter (HOSPITAL_COMMUNITY): Payer: Self-pay | Admitting: Interventional Cardiology

## 2018-05-03 DIAGNOSIS — I200 Unstable angina: Secondary | ICD-10-CM | POA: Diagnosis not present

## 2018-05-03 DIAGNOSIS — R079 Chest pain, unspecified: Secondary | ICD-10-CM

## 2018-05-03 DIAGNOSIS — I2510 Atherosclerotic heart disease of native coronary artery without angina pectoris: Secondary | ICD-10-CM | POA: Insufficient documentation

## 2018-05-03 DIAGNOSIS — L8990 Pressure ulcer of unspecified site, unspecified stage: Secondary | ICD-10-CM

## 2018-05-03 DIAGNOSIS — I10 Essential (primary) hypertension: Secondary | ICD-10-CM

## 2018-05-03 DIAGNOSIS — E785 Hyperlipidemia, unspecified: Secondary | ICD-10-CM | POA: Diagnosis not present

## 2018-05-03 DIAGNOSIS — N183 Chronic kidney disease, stage 3 (moderate): Secondary | ICD-10-CM | POA: Diagnosis not present

## 2018-05-03 DIAGNOSIS — I25110 Atherosclerotic heart disease of native coronary artery with unstable angina pectoris: Secondary | ICD-10-CM | POA: Diagnosis not present

## 2018-05-03 HISTORY — DX: Atherosclerotic heart disease of native coronary artery without angina pectoris: I25.10

## 2018-05-03 HISTORY — DX: Essential (primary) hypertension: I10

## 2018-05-03 HISTORY — PX: LEFT HEART CATH AND CORONARY ANGIOGRAPHY: CATH118249

## 2018-05-03 LAB — BASIC METABOLIC PANEL
ANION GAP: 10 (ref 5–15)
BUN: 14 mg/dL (ref 8–23)
CALCIUM: 9 mg/dL (ref 8.9–10.3)
CO2: 27 mmol/L (ref 22–32)
CREATININE: 1.3 mg/dL — AB (ref 0.61–1.24)
Chloride: 101 mmol/L (ref 98–111)
GFR, EST AFRICAN AMERICAN: 60 mL/min — AB (ref >60)
GFR, EST NON AFRICAN AMERICAN: 52 mL/min — AB (ref >60)
Glucose, Bld: 134 mg/dL — ABNORMAL HIGH (ref 70–99)
Potassium: 4.2 mmol/L (ref 3.5–5.1)
Sodium: 138 mmol/L (ref 135–145)

## 2018-05-03 LAB — LIPID PANEL
CHOL/HDL RATIO: 3.7 ratio
Cholesterol: 100 mg/dL (ref 0–200)
HDL: 27 mg/dL — ABNORMAL LOW (ref >40)
LDL Cholesterol: 38 mg/dL (ref 0–99)
Triglycerides: 176 mg/dL — ABNORMAL HIGH (ref <150)
VLDL: 35 mg/dL (ref 0–40)

## 2018-05-03 LAB — CBC
HCT: 47.5 % (ref 39.0–52.0)
Hemoglobin: 16.4 g/dL (ref 13.0–17.0)
MCH: 32.3 pg (ref 26.0–34.0)
MCHC: 34.5 g/dL (ref 30.0–36.0)
MCV: 93.7 fL (ref 78.0–100.0)
Platelets: 185 10*3/uL (ref 150–400)
RBC: 5.07 MIL/uL (ref 4.22–5.81)
RDW: 12.4 % (ref 11.5–15.5)
WBC: 7.6 10*3/uL (ref 4.0–10.5)

## 2018-05-03 LAB — ECHOCARDIOGRAM COMPLETE
HEIGHTINCHES: 74 in
Weight: 3957.7 oz

## 2018-05-03 LAB — TROPONIN I: Troponin I: <0.03 ng/mL (ref <0.03)

## 2018-05-03 SURGERY — LEFT HEART CATH AND CORONARY ANGIOGRAPHY
Anesthesia: LOCAL

## 2018-05-03 MED ORDER — HEPARIN SODIUM (PORCINE) 1000 UNIT/ML IJ SOLN
INTRAMUSCULAR | Status: AC
  Filled 2018-05-03: qty 1

## 2018-05-03 MED ORDER — MIDAZOLAM HCL 2 MG/2ML IJ SOLN
INTRAMUSCULAR | Status: DC | PRN
  Administered 2018-05-03: 0.5 mg via INTRAVENOUS
  Administered 2018-05-03: 1 mg via INTRAVENOUS

## 2018-05-03 MED ORDER — SODIUM CHLORIDE 0.9% FLUSH: 3.0000 mL | INTRAVENOUS | Status: DC | PRN

## 2018-05-03 MED ORDER — TRAMADOL HCL 50 MG PO TABS
50.0000 mg | ORAL_TABLET | Freq: Four times a day (QID) | ORAL | Status: DC | PRN
  Administered 2018-05-03: 50 mg via ORAL
  Filled 2018-05-03: qty 1

## 2018-05-03 MED ORDER — SODIUM CHLORIDE 0.9 % IV SOLN
INTRAVENOUS | Status: AC
  Administered 2018-05-03: 14:00:00 via INTRAVENOUS

## 2018-05-03 MED ORDER — HEPARIN (PORCINE) IN NACL 1000-0.9 UT/500ML-% IV SOLN
INTRAVENOUS | Status: DC | PRN
  Administered 2018-05-03 (×3): 500 mL

## 2018-05-03 MED ORDER — FENTANYL CITRATE (PF) 100 MCG/2ML IJ SOLN
INTRAMUSCULAR | Status: AC
  Filled 2018-05-03: qty 2

## 2018-05-03 MED ORDER — VERAPAMIL HCL 2.5 MG/ML IV SOLN
INTRAVENOUS | Status: AC
  Filled 2018-05-03: qty 2

## 2018-05-03 MED ORDER — SODIUM CHLORIDE 0.9% FLUSH: 3.0000 mL | Freq: Two times a day (BID) | INTRAVENOUS | Status: DC

## 2018-05-03 MED ORDER — SODIUM CHLORIDE 0.9 % IV SOLN: 250.0000 mL | INTRAVENOUS | Status: DC | PRN

## 2018-05-03 MED ORDER — LIDOCAINE HCL (PF) 1 % IJ SOLN
INTRAMUSCULAR | Status: DC | PRN
  Administered 2018-05-03: 10 mL

## 2018-05-03 MED ORDER — SODIUM CHLORIDE 0.9 % WEIGHT BASED INFUSION
1.0000 mL/kg/h | INTRAVENOUS | Status: DC
  Administered 2018-05-03: 1 mL/kg/h via INTRAVENOUS

## 2018-05-03 MED ORDER — LIDOCAINE HCL (PF) 1 % IJ SOLN
INTRAMUSCULAR | Status: AC
  Filled 2018-05-03: qty 30

## 2018-05-03 MED ORDER — SODIUM CHLORIDE 0.9 % WEIGHT BASED INFUSION
3.0000 mL/kg/h | INTRAVENOUS | Status: DC
  Administered 2018-05-03 (×2): 3 mL/kg/h via INTRAVENOUS

## 2018-05-03 MED ORDER — ACETAMINOPHEN 325 MG PO TABS: 650.0000 mg | ORAL_TABLET | ORAL | Status: DC | PRN

## 2018-05-03 MED ORDER — HEPARIN SODIUM (PORCINE) 5000 UNIT/ML IJ SOLN
5000.0000 [IU] | Freq: Three times a day (TID) | INTRAMUSCULAR | Status: DC
  Administered 2018-05-03 – 2018-05-04 (×2): 5000 [IU] via SUBCUTANEOUS
  Filled 2018-05-03 (×2): qty 1

## 2018-05-03 MED ORDER — HEPARIN (PORCINE) IN NACL 1000-0.9 UT/500ML-% IV SOLN
INTRAVENOUS | Status: AC
  Filled 2018-05-03: qty 1500

## 2018-05-03 MED ORDER — OXYCODONE HCL 5 MG PO TABS: 5.0000 mg | ORAL_TABLET | ORAL | Status: DC | PRN

## 2018-05-03 MED ORDER — METOPROLOL TARTRATE 50 MG PO TABS
50.0000 mg | ORAL_TABLET | Freq: Once | ORAL | Status: AC
  Administered 2018-05-03: 50 mg via ORAL

## 2018-05-03 MED ORDER — ONDANSETRON HCL 4 MG/2ML IJ SOLN: 4.0000 mg | Freq: Four times a day (QID) | INTRAMUSCULAR | Status: DC | PRN

## 2018-05-03 MED ORDER — IOHEXOL 350 MG/ML SOLN
INTRAVENOUS | Status: DC | PRN
  Administered 2018-05-03: 100 mL via INTRAVENOUS

## 2018-05-03 MED ORDER — MIDAZOLAM HCL 2 MG/2ML IJ SOLN
INTRAMUSCULAR | Status: AC
  Filled 2018-05-03: qty 2

## 2018-05-03 MED ORDER — HEPARIN SODIUM (PORCINE) 1000 UNIT/ML IJ SOLN
INTRAMUSCULAR | Status: DC | PRN
  Administered 2018-05-03: 5000 [IU] via INTRAVENOUS

## 2018-05-03 MED ORDER — FENTANYL CITRATE (PF) 100 MCG/2ML IJ SOLN
INTRAMUSCULAR | Status: DC | PRN
  Administered 2018-05-03 (×2): 25 ug via INTRAVENOUS
  Administered 2018-05-03: 50 ug via INTRAVENOUS

## 2018-05-03 MED ORDER — VERAPAMIL HCL 2.5 MG/ML IV SOLN
INTRAVENOUS | Status: DC | PRN
  Administered 2018-05-03: 10 mL via INTRA_ARTERIAL

## 2018-05-03 SURGICAL SUPPLY — 10 items
CATH 5FR JL3.5 JR4 ANG PIG MP (CATHETERS) ×1 IMPLANT
DEVICE RAD COMP TR BAND LRG (VASCULAR PRODUCTS) ×1 IMPLANT
GLIDESHEATH SLEND A-KIT 6F 22G (SHEATH) ×1 IMPLANT
GUIDEWIRE INQWIRE 1.5J.035X260 (WIRE) IMPLANT
INQWIRE 1.5J .035X260CM (WIRE) ×2
KIT HEART LEFT (KITS) ×2 IMPLANT
PACK CARDIAC CATHETERIZATION (CUSTOM PROCEDURE TRAY) ×2 IMPLANT
SHEATH PROBE COVER 6X72 (BAG) ×1 IMPLANT
TRANSDUCER W/STOPCOCK (MISCELLANEOUS) ×2 IMPLANT
TUBING CIL FLEX 10 FLL-RA (TUBING) ×2 IMPLANT

## 2018-05-03 NOTE — Care Management Obs Status (Signed)
[facility] NOTIFICATION   Patient Details  Name: Jack Farmer MRN: [id number] Date of Birth: 1940-09-29   Medicare Observation Status Notification Given:  Yes    Carles Collet, RN 05/03/2018, 3:50 PM

## 2018-05-03 NOTE — Progress Notes (Signed)
  Echocardiogram 2D Echocardiogram has been performed.  Jack Farmer 05/03/2018, 5:04 PM

## 2018-05-03 NOTE — Progress Notes (Signed)
  Echocardiogram 2D Echocardiogram has been attempted. Nurse said to come back later. Patient leaving for Cath Lab.  Jack Farmer G Ozias Dicenzo 05/03/2018, 11:57 AM

## 2018-05-03 NOTE — Progress Notes (Addendum)
Progress Note  Patient Name: Jack Farmer Date of Encounter: 05/03/2018  Primary Cardiologist: Kirk Ruths, MD   Subjective   Denies chest pain or SOB complaints this morning. Hopeful to get the CT scan of his heart and be discharged home. He did not sleep well due to chronic back pain.   Inpatient Medications    Scheduled Meds: . amLODipine  5 mg Oral Daily  . aspirin EC  81 mg Oral Daily  . heparin  5,000 Units Subcutaneous Q8H  . metoprolol tartrate  50 mg Oral BID  . simvastatin  40 mg Oral q1800  . tamsulosin  0.4 mg Oral Daily   Continuous Infusions: . sodium chloride 50 mL/hr at 05/03/18 0634   PRN Meds: acetaminophen, nitroGLYCERIN, ondansetron (ZOFRAN) IV   Vital Signs    Vitals:   05/02/18 2000 05/02/18 2214 05/02/18 2243 05/03/18 0438  BP: (!) 151/74 133/62  137/75  Pulse: 88 85 95 91  Resp: 12 18  18   Temp:  98 F (36.7 C)  98.5 F (36.9 C)  TempSrc:  Oral  Oral  SpO2: 95% 95%  95%  Weight:  112.4 kg  112.2 kg  Height:  6\' 2"  (1.88 m)      Intake/Output Summary (Last 24 hours) at 05/03/2018 0741 Last data filed at 05/03/2018 0634 Gross per 24 hour  Intake 966.57 ml  Output 500 ml  Net 466.57 ml   Filed Weights   05/02/18 1555 05/02/18 2214 05/03/18 0438  Weight: 114 kg 112.4 kg 112.2 kg    Telemetry    NSR with brief episode of SVT with HR up to 140s overnight.  - Personally Reviewed  Physical Exam   GEN: Sitting in bedside chair in no acute distress.   Neck: No JVD, no carotid bruits Cardiac: RRR, no murmurs, rubs, or gallops.  Respiratory: Clear to auscultation bilaterally, no wheezes/ rales/ rhonchi GI: NABS, Soft, obese, nontender, non-distended  MS: No edema; No deformity. Neuro:  Nonfocal, moving all extremities spontaneously Psych: Normal affect   Labs    Chemistry Recent Labs  Lab 05/02/18 1547 05/02/18 2103  NA 137  --   K 3.3*  --   CL 98  --   CO2 28  --   GLUCOSE 153*  --   BUN 17  --   CREATININE  1.37* 1.22  CALCIUM 9.2  --   PROT 6.7  --   ALBUMIN 3.8  --   AST 20  --   ALT 27  --   ALKPHOS 78  --   BILITOT 0.9  --   GFRNONAA 49* 56*  GFRAA 56* >60  ANIONGAP 11  --      Hematology Recent Labs  Lab 05/02/18 1547 05/02/18 2103  WBC 7.7 7.4  RBC 5.30 5.27  HGB 16.5 16.8  HCT 48.9 48.1  MCV 92.3 91.3  MCH 31.1 31.9  MCHC 33.7 34.9  RDW 12.2 12.1  PLT 185 171    Cardiac Enzymes Recent Labs  Lab 05/02/18 1547 05/02/18 2103 05/03/18 0235  TROPONINI <0.03 <0.03 <0.03   No results for input(s): TROPIPOC in the last 168 hours.   BNPNo results for input(s): BNP, PROBNP in the last 168 hours.   DDimer  Recent Labs  Lab 05/02/18 1547  DDIMER <0.27     Radiology    Dg Chest 2 View  Result Date: 05/02/2018 CLINICAL DATA:  Chest pain EXAM: CHEST - 2 VIEW COMPARISON:  10/24/2014 FINDINGS: No  focal opacity or pleural effusion. Cardiomediastinal silhouette within normal limits. Aortic atherosclerosis. No pneumothorax. Degenerative changes of the spine. IMPRESSION: No active cardiopulmonary disease. Electronically Signed   By: Donavan Foil M.D.   On: 05/02/2018 16:44    Cardiac Studies   NST 04/2017:  Nuclear stress EF: 51%. Consider echo to further define wall motion.  Inferior, inferolateral defect consistent with probable soft tissue attenuation. No evidence of ischemia  This is a low risk study.  Jack Farmer 2008: ANGIOGRAPHIC FINDINGS:  1. The left main coronary artery gives rise to the left anterior      descending and circumflex coronary arteries.  There is no      significant flow-limiting stenosis noted within this vessel.  2. The left anterior descending is a medium-caliber vessel with 3      diagonal branches, the first of which is the largest and proximal,      near the origin of the left anterior descending.  There are minor      luminal irregularities noted including approximately 20% stenosis      in the proximal left anterior descending.  No  flow-limiting      stenoses are noted.  3. The circumflex coronary artery is a medium-caliber vessel with 4      obtuse marginal branches, the last of which is quite large.  Within      the distal circumflex proper is an area of diffuse 40% to 50%      stenosis.  At the takeoff of the large obtuse marginal, there is an      area of 40% to 50% stenosis within the mid circumflex.  The obtuse      marginal itself has approximately 30% stenosis proximally.  Within      the proximal to mid vessel is an area of 20% stenosis.  4. The right coronary artery is a medium-caliber dominant vessel with      relatively large posterior descending branch.  There is an area of      focal 50% stenosis within the mid right coronary artery.  Preceding      this is an area of 20% stenosis and in the distal segment of the      vessel is an area of 30% stenosis.   LEFT VENTRICULOGRAPHY:  Performed in the RAO projection and reveals an  ejection fraction of approximately 65% with no significant mitral  regurgitation.  DIAGNOSES:  1. Mild to moderate coronary atherosclerosis as outlined above.  There      is approximately 50% stenosis within the mid right coronary artery      as well as an area of 40% to 50% stenosis within the circumflex, as      discussed.  2. Left ventricular ejection fraction is approximately 65% with no      significant mitral regurgitation and a left ventricle end-diastolic      pressure of 11 mmHg.     Patient Profile     77 y.o. male with a PMH of non-obstructive CAD, HTN, and HLD, who presented with chest pain.   Assessment & Plan    1. Chest pain in patient with mild-mod non-obstructive CAD: patient presented with chest pain and SOB. Trops are negative. EKG non-ischemic. Ddimer negative. CXR without acute findings. Lipid panel with LDL 38 with elevated triglycerides of 176. Would benefit from further ischemic evaluation given mild-mod CAD on last cath in 2008. - Will plan for  coronary CTA today - if negative,  do not anticipate further work-up.   2. HTN: BP elevated on admission, improved this morning. Home ARB on hold in anticipation of coronary CTA.  - Continue metoprolol and amlodipine. - Could consider increasing amlodipine if BP persistently elevated. - Anticipate restarting ARB post CT scan.   3. HLD: LDL 38 - Continue simvastatin.   For questions or updates, please contact [place] Please consult [email or web address] for contact info under Cardiology/STEMI.      Signed, Abigail Butts, PA-C  05/03/2018, 7:41 AM   [phone] As above, pt seen and examined; no CP; Symptoms at time of admission concerning for UA. Will cancel cardiac CTA and instead proceed with cath. Risks and benefits discussed including MI, CVA and death and he agrees to proceed. Cr mildly elevated at time of admission (likely chronic stage 3 kidney disease). Continue hydration prior to procedure. Follow renal function afterwards. Continue present cardiac meds.  Kirk Ruths, MD

## 2018-05-03 NOTE — Interval H&P Note (Signed)
Cath Lab Visit (complete for each Cath Lab visit)  Clinical Evaluation Leading to the Procedure:   ACS: Yes.    Non-ACS:    Anginal Classification: CCS III  Anti-ischemic medical therapy: Minimal Therapy (1 class of medications)  Non-Invasive Test Results: No non-invasive testing performed  Prior CABG: No previous CABG      History and Physical Interval Note:  05/03/2018 11:53 AM  Jack Farmer  has presented today for surgery, with the diagnosis of cp  The various methods of treatment have been discussed with the patient and family. After consideration of risks, benefits and other options for treatment, the patient has consented to  Procedure(s): LEFT HEART CATH AND CORONARY ANGIOGRAPHY (N/A) as a surgical intervention .  The patient's history has been reviewed, patient examined, no change in status, stable for surgery.  I have reviewed the patient's chart and labs.  Questions were answered to the patient's satisfaction.     Belva Crome III

## 2018-05-03 NOTE — H&P (View-Only) (Signed)
Progress Note  Patient Name: Jack Farmer Date of Encounter: 05/03/2018  Primary Cardiologist: Kirk Ruths, MD   Subjective   Denies chest pain or SOB complaints this morning. Hopeful to get the CT scan of his heart and be discharged home. He did not sleep well due to chronic back pain.   Inpatient Medications    Scheduled Meds: . amLODipine  5 mg Oral Daily  . aspirin EC  81 mg Oral Daily  . heparin  5,000 Units Subcutaneous Q8H  . metoprolol tartrate  50 mg Oral BID  . simvastatin  40 mg Oral q1800  . tamsulosin  0.4 mg Oral Daily   Continuous Infusions: . sodium chloride 50 mL/hr at 05/03/18 0634   PRN Meds: acetaminophen, nitroGLYCERIN, ondansetron (ZOFRAN) IV   Vital Signs    Vitals:   05/02/18 2000 05/02/18 2214 05/02/18 2243 05/03/18 0438  BP: (!) 151/74 133/62  137/75  Pulse: 88 85 95 91  Resp: 12 18  18   Temp:  98 F (36.7 C)  98.5 F (36.9 C)  TempSrc:  Oral  Oral  SpO2: 95% 95%  95%  Weight:  112.4 kg  112.2 kg  Height:  6\' 2"  (1.88 m)      Intake/Output Summary (Last 24 hours) at 05/03/2018 0741 Last data filed at 05/03/2018 0634 Gross per 24 hour  Intake 966.57 ml  Output 500 ml  Net 466.57 ml   Filed Weights   05/02/18 1555 05/02/18 2214 05/03/18 0438  Weight: 114 kg 112.4 kg 112.2 kg    Telemetry    NSR with brief episode of SVT with HR up to 140s overnight.  - Personally Reviewed  Physical Exam   GEN: Sitting in bedside chair in no acute distress.   Neck: No JVD, no carotid bruits Cardiac: RRR, no murmurs, rubs, or gallops.  Respiratory: Clear to auscultation bilaterally, no wheezes/ rales/ rhonchi GI: NABS, Soft, obese, nontender, non-distended  MS: No edema; No deformity. Neuro:  Nonfocal, moving all extremities spontaneously Psych: Normal affect   Labs    Chemistry Recent Labs  Lab 05/02/18 1547 05/02/18 2103  NA 137  --   K 3.3*  --   CL 98  --   CO2 28  --   GLUCOSE 153*  --   BUN 17  --   CREATININE  1.37* 1.22  CALCIUM 9.2  --   PROT 6.7  --   ALBUMIN 3.8  --   AST 20  --   ALT 27  --   ALKPHOS 78  --   BILITOT 0.9  --   GFRNONAA 49* 56*  GFRAA 56* >60  ANIONGAP 11  --      Hematology Recent Labs  Lab 05/02/18 1547 05/02/18 2103  WBC 7.7 7.4  RBC 5.30 5.27  HGB 16.5 16.8  HCT 48.9 48.1  MCV 92.3 91.3  MCH 31.1 31.9  MCHC 33.7 34.9  RDW 12.2 12.1  PLT 185 171    Cardiac Enzymes Recent Labs  Lab 05/02/18 1547 05/02/18 2103 05/03/18 0235  TROPONINI <0.03 <0.03 <0.03   No results for input(s): TROPIPOC in the last 168 hours.   BNPNo results for input(s): BNP, PROBNP in the last 168 hours.   DDimer  Recent Labs  Lab 05/02/18 1547  DDIMER <0.27     Radiology    Dg Chest 2 View  Result Date: 05/02/2018 CLINICAL DATA:  Chest pain EXAM: CHEST - 2 VIEW COMPARISON:  10/24/2014 FINDINGS: No  focal opacity or pleural effusion. Cardiomediastinal silhouette within normal limits. Aortic atherosclerosis. No pneumothorax. Degenerative changes of the spine. IMPRESSION: No active cardiopulmonary disease. Electronically Signed   By: Donavan Foil M.D.   On: 05/02/2018 16:44    Cardiac Studies   NST 04/2017:  Nuclear stress EF: 51%. Consider echo to further define wall motion.  Inferior, inferolateral defect consistent with probable soft tissue attenuation. No evidence of ischemia  This is a low risk study.  [place] 2008: ANGIOGRAPHIC FINDINGS:  1. The left main coronary artery gives rise to the left anterior      descending and circumflex coronary arteries.  There is no      significant flow-limiting stenosis noted within this vessel.  2. The left anterior descending is a medium-caliber vessel with 3      diagonal branches, the first of which is the largest and proximal,      near the origin of the left anterior descending.  There are minor      luminal irregularities noted including approximately 20% stenosis      in the proximal left anterior descending.  No  flow-limiting      stenoses are noted.  3. The circumflex coronary artery is a medium-caliber vessel with 4      obtuse marginal branches, the last of which is quite large.  Within      the distal circumflex proper is an area of diffuse 40% to 50%      stenosis.  At the takeoff of the large obtuse marginal, there is an      area of 40% to 50% stenosis within the mid circumflex.  The obtuse      marginal itself has approximately 30% stenosis proximally.  Within      the proximal to mid vessel is an area of 20% stenosis.  4. The right coronary artery is a medium-caliber dominant vessel with      relatively large posterior descending branch.  There is an area of      focal 50% stenosis within the mid right coronary artery.  Preceding      this is an area of 20% stenosis and in the distal segment of the      vessel is an area of 30% stenosis.   LEFT VENTRICULOGRAPHY:  Performed in the RAO projection and reveals an  ejection fraction of approximately 65% with no significant mitral  regurgitation.  DIAGNOSES:  1. Mild to moderate coronary atherosclerosis as outlined above.  There      is approximately 50% stenosis within the mid right coronary artery      as well as an area of 40% to 50% stenosis within the circumflex, as      discussed.  2. Left ventricular ejection fraction is approximately 65% with no      significant mitral regurgitation and a left ventricle end-diastolic      pressure of 11 mmHg.     Patient Profile     77 y.o. male with a PMH of non-obstructive CAD, HTN, and HLD, who presented with chest pain.   Assessment & Plan    1. Chest pain in patient with mild-mod non-obstructive CAD: patient presented with chest pain and SOB. Trops are negative. EKG non-ischemic. Ddimer negative. CXR without acute findings. Lipid panel with LDL 38 with elevated triglycerides of 176. Would benefit from further ischemic evaluation given mild-mod CAD on last cath in 2008. - Will plan for  coronary CTA today - if negative,  do not anticipate further work-up.   2. HTN: BP elevated on admission, improved this morning. Home ARB on hold in anticipation of coronary CTA.  - Continue metoprolol and amlodipine. - Could consider increasing amlodipine if BP persistently elevated. - Anticipate restarting ARB post CT scan.   3. HLD: LDL 38 - Continue simvastatin.   For questions or updates, please contact [place] Please consult [email or web address] for contact info under Cardiology/STEMI.      Signed, Abigail Butts, PA-C  05/03/2018, 7:41 AM   [phone] As above, pt seen and examined; no CP; Symptoms at time of admission concerning for UA. Will cancel cardiac CTA and instead proceed with cath. Risks and benefits discussed including MI, CVA and death and he agrees to proceed. Cr mildly elevated at time of admission (likely chronic stage 3 kidney disease). Continue hydration prior to procedure. Follow renal function afterwards. Continue present cardiac meds.  Kirk Ruths, MD

## 2018-05-04 ENCOUNTER — Other Ambulatory Visit: Payer: Self-pay | Admitting: Medical

## 2018-05-04 DIAGNOSIS — I200 Unstable angina: Secondary | ICD-10-CM | POA: Diagnosis not present

## 2018-05-04 DIAGNOSIS — I10 Essential (primary) hypertension: Secondary | ICD-10-CM

## 2018-05-04 LAB — BASIC METABOLIC PANEL
Anion gap: 10 (ref 5–15)
BUN: 19 mg/dL (ref 8–23)
CALCIUM: 8.6 mg/dL — AB (ref 8.9–10.3)
CO2: 26 mmol/L (ref 22–32)
Chloride: 102 mmol/L (ref 98–111)
Creatinine, Ser: 1.4 mg/dL — ABNORMAL HIGH (ref 0.61–1.24)
GFR calc Af Amer: 55 mL/min — ABNORMAL LOW (ref >60)
GFR, EST NON AFRICAN AMERICAN: 47 mL/min — AB (ref >60)
Glucose, Bld: 126 mg/dL — ABNORMAL HIGH (ref 70–99)
Potassium: 4.2 mmol/L (ref 3.5–5.1)
SODIUM: 138 mmol/L (ref 135–145)

## 2018-05-04 MED ORDER — NITROGLYCERIN 0.4 MG SL SUBL: 0.4000 mg | SUBLINGUAL_TABLET | SUBLINGUAL | 3 refills | Status: AC | PRN

## 2018-05-04 MED ORDER — METOPROLOL TARTRATE 50 MG PO TABS: 50.0000 mg | ORAL_TABLET | Freq: Two times a day (BID) | ORAL | 3 refills | Status: DC

## 2018-05-04 MED ORDER — CHLORTHALIDONE 25 MG PO TABS: 12.5000 mg | ORAL_TABLET | Freq: Every day | ORAL | 3 refills | Status: DC

## 2018-05-04 NOTE — Progress Notes (Signed)
Progress Note  Patient Name: Jack Farmer Date of Encounter: 05/04/2018  Primary Cardiologist: Kirk Ruths, MD   Subjective   No CP or dyspnea  Inpatient Medications    Scheduled Meds: . amLODipine  5 mg Oral Daily  . aspirin EC  81 mg Oral Daily  . heparin  5,000 Units Subcutaneous Q8H  . metoprolol tartrate  50 mg Oral BID  . simvastatin  40 mg Oral q1800  . sodium chloride flush  3 mL Intravenous Q12H  . tamsulosin  0.4 mg Oral Daily   Continuous Infusions: . sodium chloride     PRN Meds: sodium chloride, acetaminophen, nitroGLYCERIN, ondansetron (ZOFRAN) IV, sodium chloride flush, traMADol   Vital Signs    Vitals:   05/03/18 1926 05/03/18 2124 05/03/18 2144 05/04/18 0504  BP: 104/85  130/61 123/71  Pulse:  90 79 83  Resp: 17     Temp:   98.2 F (36.8 C) 98.1 F (36.7 C)  TempSrc:    Oral  SpO2:   94% 94%  Weight:    113.8 kg  Height:        Intake/Output Summary (Last 24 hours) at 05/04/2018 0809 Last data filed at 05/04/2018 0600 Gross per 24 hour  Intake 1783.32 ml  Output 700 ml  Net 1083.32 ml   Filed Weights   05/02/18 2214 05/03/18 0438 05/04/18 0504  Weight: 112.4 kg 112.2 kg 113.8 kg    Telemetry    Sinus - Personally Reviewed  Physical Exam   GEN: WD/WN NAD Neck: Supple Cardiac: RRR Respiratory: CTA GI: NT/ND, no masse MS: No edema, radial cath site with no hematoma. Neuro:  Grossly intact   Labs    Chemistry Recent Labs  Lab 05/02/18 1547 05/02/18 2103 05/03/18 0801 05/04/18 0429  NA 137  --  138 138  K 3.3*  --  4.2 4.2  CL 98  --  101 102  CO2 28  --  27 26  GLUCOSE 153*  --  134* 126*  BUN 17  --  14 19  CREATININE 1.37* 1.22 1.30* 1.40*  CALCIUM 9.2  --  9.0 8.6*  PROT 6.7  --   --   --   ALBUMIN 3.8  --   --   --   AST 20  --   --   --   ALT 27  --   --   --   ALKPHOS 78  --   --   --   BILITOT 0.9  --   --   --   GFRNONAA 49* 56* 52* 47*  GFRAA 56* >60 60* 55*  ANIONGAP 11  --  10 10      Hematology Recent Labs  Lab 05/02/18 1547 05/02/18 2103 05/03/18 0801  WBC 7.7 7.4 7.6  RBC 5.30 5.27 5.07  HGB 16.5 16.8 16.4  HCT 48.9 48.1 47.5  MCV 92.3 91.3 93.7  MCH 31.1 31.9 32.3  MCHC 33.7 34.9 34.5  RDW 12.2 12.1 12.4  PLT 185 171 185    Cardiac Enzymes Recent Labs  Lab 05/02/18 1547 05/02/18 2103 05/03/18 0235 05/03/18 0801  TROPONINI <0.03 <0.03 <0.03 <0.03    DDimer  Recent Labs  Lab 05/02/18 1547  DDIMER <0.27     Radiology    Dg Chest 2 View  Result Date: 05/02/2018 CLINICAL DATA:  Chest pain EXAM: CHEST - 2 VIEW COMPARISON:  10/24/2014 FINDINGS: No focal opacity or pleural effusion. Cardiomediastinal silhouette within normal limits.  Aortic atherosclerosis. No pneumothorax. Degenerative changes of the spine. IMPRESSION: No active cardiopulmonary disease. Electronically Signed   By: Donavan Foil M.D.   On: 05/02/2018 16:44       Patient Profile     77 y.o. male with a PMH of non-obstructive CAD, HTN, and HLD, who presented with chest pain.  Catheterization reveals moderate coronary disease to be treated medically.  Assessment & Plan    1.  Coronary artery disease: Results of cardiac catheterization noted.  Plan is medical therapy.  Continue aspirin and statin.  2. HTN: We will resume preadmission blood pressure medications at discharge except decrease chlorthalidone to 12.5 mg daily.  Will check potassium and renal function in 1 week.  3. HLD: LDL 38 -Continue present dose of Zocor.  Discharge today and follow-up with APP 4 to 6 weeks.  Follow-up with me 3 to 6 months.  > 30 min PA and physician time D2  For questions or updates, please contact [place] Please consult [email or web address] for contact info under Cardiology/STEMI.      Signed, Kirk Ruths, MD  05/04/2018, 8:09 AM

## 2018-05-04 NOTE — Discharge Instructions (Signed)
PLEASE REMEMBER TO BRING ALL OF YOUR MEDICATIONS TO EACH OF YOUR FOLLOW-UP OFFICE VISITS.  PLEASE ATTEND ALL SCHEDULED FOLLOW-UP APPOINTMENTS.   Activity: Increase activity slowly as tolerated. You may shower, but no soaking baths (or swimming) for 1 week. No driving for 24 hours. No lifting over 5 lbs for 1 week. No sexual activity for 1 week.   You May Return to Work: in 1 week (if applicable)  Wound Care: You may wash cath site gently with soap and water. Keep cath site clean and dry. If you notice pain, swelling, bleeding or pus at your cath site, please call [phone].  We would like for you to present to the [place] office on 05/11/18 to have your blood work checked to monitor your kidney function and potassium level. Please see your follow-up appointment for further instructions.

## 2018-05-04 NOTE — Discharge Summary (Signed)
Discharge Summary    Patient ID: Jack Farmer,  MRN: [id number], DOB/AGE: 1941/01/30 77 y.o.  Admit date: 05/02/2018 Discharge date: 05/04/2018  Primary Care Provider: Janith Lima Primary Cardiologist: Kirk Ruths, MD  Discharge Diagnoses    Principal Problem:   Unstable angina [facility]) Active Problems:   Hyperlipidemia with target LDL less than 70   Essential hypertension   Coronary atherosclerosis   Abnormal electrocardiogram (ECG) (EKG)   Dyspnea on exertion   Chest pain   Pressure injury of skin   Allergies Allergies  Allergen Reactions  . Cephalexin Anaphylaxis  . Indocin [Indomethacin] Other (See Comments)     Gi bleed    Diagnostic Studies/Procedures    Left heart catheterization 05/03/18:  Moderate, nonobstructive three-vessel CAD  Normal left main  LAD does not wraparound the left ventricular apex.  The LAD gives origin to a large first diagonal.  The first diagonal contains mid segment 30 to 40% narrowing.  The LAD territory is otherwise widely patent.  Circumflex gives origin to 3 obtuse marginals, the second of which is the larger of the branches.  Beyond the origin of the second marginal there is concentric 60 to 70% stenosis unchanged from angiography in 2008.  The third obtuse marginal contains segmental 50% narrowing unchanged from prior angiography.  The right coronary is dominant.  Moderate calcification is noted throughout the proximal and mid vessel.  Eccentric 50 to 60% mid stenosis is noted.  Diffuse 50 to 60% distal stenosis.  PDA contains 50 to 60% mid stenosis.  Left ventricular function is normal.  EF is estimated to be 60%.  LVEDP is upper normal at 18 mmHg.  RECOMMENDATIONS:   Aggressive risk factor modification.  Consider alternative explanations for chest discomfort and dyspnea.   Recommend Aspirin 81mg  daily for moderate CAD.  Echocardiogram 05/03/18: Study Conclusions  - Left ventricle: The cavity size was  normal. Wall thickness was   increased in a pattern of moderate LVH. Systolic function was   normal. The estimated ejection fraction was in the range of 60%   to 65%. Wall motion was normal; there were no regional wall   motion abnormalities. Features are consistent with a pseudonormal   left ventricular filling pattern, with concomitant abnormal   relaxation and increased filling pressure (grade 2 diastolic   dysfunction).  _____________   History of Present Illness      77 y.o. male who is being seen 05/02/18 for the evaluation of chest pain and dyspnea. [name] is a patient of Dr. Stanford Breed with a history of HTN, HLD and CAD. He was last seen in 2014, but not recently. He had a heart cath in 2008 which demonstrated 20% proximal LAD disease, 40-50% circumflex disease and 50% RCA stenosis. LVEF was 65%. He has been managed medically and does not seem to have had interim cardiac events. In 11/2016, he was seen by his PCP Dr. Ronnald Ramp and had a stress test for a "vague episode" of chest pain. This was negative for ischemia with LVEF 51%. On 05/02/16 he presents with chest pain to his PCP office, associated with DOE, diaphoresis and fatigue. An EKG suggested mild lateral ST depression (but was read as subendocardial ischemia) - personally reviewed by Dr. Debara Pickett, and he was transferred to [facility] ER for evaluation. EKG compared to prior study in 2016 when he arrived in the ER does not show ischemic changes. Blood pressure is mildly elevated.  Labs indicate mildly elevated creatinine, mild  hypokalemia 3.3 and are otherwise normal. Troponin negative x 1. D-dimer is negative. CXR shows NAD.  Hospital Course     Consultants: None   1. Chest pain in patient with non-obstructive CAD: patients symptoms were concerning for unstable angina. He underwent a LHC 05/03/18 which revealed moderate non-obstructive CAD with 30-40% stenosis of the mid 1st diagonal, 60-70% stenosis of the OM2, 50% stenosis of the OM3 ,  50-60% stenosis of the mid-distal RCA, and 50-60% stenosis of the PDA.  Echo showed EF 60-65%, with moderate LVH, no wall motion abnormalities, and G2DD. He was recommended for aggressive risk factor modifications and continuation of aspirin.  - Continue aspirin and statin - Restarted previously prescribed metoprolol 50mg  BID - Rx given for prn SL nitro  2. HTN: BP overall stable this admission. Home chlorthalidone was decreased to 12.5mg  daily. Metoprolol was restarted. Remained of home medications continued - Continue amlodipine, chlorthalidone, metoprolol, and telmisartan - Titrate medications as needed outpatient   3. HLD: LDL well controlled at 38 - Continue zocor _____________  Discharge Vitals Blood pressure 123/71, pulse 83, temperature 98.1 F (36.7 C), temperature source Oral, resp. rate 17, height 6\' 2"  (1.88 m), weight 113.8 kg, SpO2 94 %.  Filed Weights   05/02/18 2214 05/03/18 0438 05/04/18 0504  Weight: 112.4 kg 112.2 kg 113.8 kg    Labs & Radiologic Studies    CBC Recent Labs    05/02/18 2103 05/03/18 0801  WBC 7.4 7.6  HGB 16.8 16.4  HCT 48.1 47.5  MCV 91.3 93.7  PLT 171 976   Basic Metabolic Panel Recent Labs    05/03/18 0801 05/04/18 0429  NA 138 138  K 4.2 4.2  CL 101 102  CO2 27 26  GLUCOSE 134* 126*  BUN 14 19  CREATININE 1.30* 1.40*  CALCIUM 9.0 8.6*   Liver Function Tests Recent Labs    05/02/18 1547  AST 20  ALT 27  ALKPHOS 78  BILITOT 0.9  PROT 6.7  ALBUMIN 3.8   No results for input(s): LIPASE, AMYLASE in the last 72 hours. Cardiac Enzymes Recent Labs    05/02/18 2103 05/03/18 0235 05/03/18 0801  TROPONINI <0.03 <0.03 <0.03   BNP Invalid input(s): POCBNP D-Dimer Recent Labs    05/02/18 1547  DDIMER <0.27   Hemoglobin A1C No results for input(s): HGBA1C in the last 72 hours. Fasting Lipid Panel Recent Labs    05/03/18 0235  CHOL 100  HDL 27*  LDLCALC 38  TRIG 176*  CHOLHDL 3.7   Thyroid Function  Tests No results for input(s): TSH, T4TOTAL, T3FREE, THYROIDAB in the last 72 hours.  Invalid input(s): FREET3 _____________  Dg Chest 2 View  Result Date: 05/02/2018 CLINICAL DATA:  Chest pain EXAM: CHEST - 2 VIEW COMPARISON:  10/24/2014 FINDINGS: No focal opacity or pleural effusion. Cardiomediastinal silhouette within normal limits. Aortic atherosclerosis. No pneumothorax. Degenerative changes of the spine. IMPRESSION: No active cardiopulmonary disease. Electronically Signed   By: Donavan Foil M.D.   On: 05/02/2018 16:44   Disposition   Patient was seen and examined by Dr. Stanford Breed who deemed patient as stable for discharge. Follow-up has been arranged. Discharge medications as listed below.   Follow-up Plans & Appointments    Follow-up Information    Erlene Quan, PA-C Follow up on 06/06/2018.   Specialties:  Cardiology, Radiology Why:  Please arrive 15 minutes early for your 9:00am post-hospital cardiology follow-up appointment Contact information: [address] [place] [address] [phone]  Lelon Perla, MD Follow up on 07/31/2018.   Specialty:  Cardiology Why:  Please arrive 15 minutes early for your 8:20am appointment Contact information: [address] [place] [id number] [place] Follow up on 05/11/2018.   Specialty:  Cardiology Why:  Please present to the office on 05/11/18 for blood work to monitor your kidney function and potassium level. You can arrive anytime between 8:30am and 4:30pm.  Contact information: [address] [place] [phone]         Discharge Instructions    Diet - low sodium heart healthy   Complete by:  As directed    Increase activity slowly   Complete by:  As directed       Discharge Medications   Allergies as of 05/04/2018      Reactions   Cephalexin Anaphylaxis   Indocin [indomethacin] Other (See  Comments)    Gi bleed      Medication List    STOP taking these medications   clobetasol ointment 0.05 % Commonly known as:  TEMOVATE     TAKE these medications   amLODipine 5 MG tablet Commonly known as:  NORVASC TAKE 1 TABLET BY MOUTH EVERY DAY   aspirin EC 81 MG tablet Take 81 mg by mouth daily.   b complex vitamins tablet Take 1 tablet by mouth daily.   chlorthalidone 25 MG tablet Commonly known as:  HYGROTON Take 0.5 tablets (12.5 mg total) by mouth daily. What changed:  how much to take   Fish Oil 1000 MG Caps Take 1,000 mg by mouth daily.   metoprolol tartrate 50 MG tablet Commonly known as:  LOPRESSOR Take 1 tablet (50 mg total) by mouth 2 (two) times daily.   nitroGLYCERIN 0.4 MG SL tablet Commonly known as:  NITROSTAT Place 1 tablet (0.4 mg total) under the tongue every 5 (five) minutes x 3 doses as needed for chest pain.   simvastatin 40 MG tablet Commonly known as:  ZOCOR TAKE 1 TABLET BY MOUTH EVERY DAY IN THE EVENING What changed:    how much to take  how to take this  when to take this   tamsulosin 0.4 MG Caps capsule Commonly known as:  FLOMAX TAKE 1 CAPSULE (0.4 MG TOTAL) BY MOUTH DAILY. OVERDUE FOR YEARLY PHYSICAL MUST SEE MD FOR REFILLS What changed:  See the new instructions.   telmisartan 40 MG tablet Commonly known as:  MICARDIS TAKE 1 TABLET EVERY DAY   tiZANidine 4 MG tablet Commonly known as:  ZANAFLEX Take 4 mg by mouth at bedtime.   traMADol 50 MG tablet Commonly known as:  ULTRAM TAKE 1 TABLET BY MOUTH EVERY 6 HOURS AS NEEDED   varenicline 1 MG tablet Commonly known as:  CHANTIX Take 1 tablet (1 mg total) by mouth 2 (two) times daily.   vitamin C 1000 MG tablet Take 1,000 mg by mouth daily.   vitamin E 400 UNIT capsule Take 400 Units by mouth daily.        Aspirin prescribed at discharge?  Yes High Intensity Statin Prescribed? (Lipitor 40-80mg  or Crestor 20-40mg ): No: LDL well controled on simvastatin Beta  Blocker Prescribed? Yes For EF <40%, was ACEI/ARB Prescribed? Yes ADP Receptor Inhibitor Prescribed? (i.e. Plavix etc.-Includes Medically Managed Patients): No: non-obstructive disease For EF <40%, Aldosterone Inhibitor Prescribed? No: EF >40% Was EF assessed during THIS hospitalization? Yes Was Cardiac Rehab II ordered? (Included Medically  managed Patients): No   Outstanding Labs/Studies   BMET in 1 week to monitor kidney function and potassium  Duration of Discharge Encounter   Greater than 30 minutes including physician time.  Signed, Abigail Butts PA-C 05/04/2018, 9:29 AM

## 2018-05-10 ENCOUNTER — Other Ambulatory Visit: Payer: Self-pay | Admitting: Internal Medicine

## 2018-05-10 DIAGNOSIS — I10 Essential (primary) hypertension: Secondary | ICD-10-CM

## 2018-05-16 ENCOUNTER — Other Ambulatory Visit: Payer: Self-pay | Admitting: Internal Medicine

## 2018-05-16 DIAGNOSIS — I10 Essential (primary) hypertension: Secondary | ICD-10-CM

## 2018-05-16 DIAGNOSIS — I2510 Atherosclerotic heart disease of native coronary artery without angina pectoris: Secondary | ICD-10-CM

## 2018-05-24 ENCOUNTER — Ambulatory Visit (INDEPENDENT_AMBULATORY_CARE_PROVIDER_SITE_OTHER): Payer: Medicare Other | Admitting: Internal Medicine

## 2018-05-24 ENCOUNTER — Encounter: Payer: Self-pay | Admitting: Internal Medicine

## 2018-05-24 ENCOUNTER — Other Ambulatory Visit (INDEPENDENT_AMBULATORY_CARE_PROVIDER_SITE_OTHER): Payer: Medicare Other

## 2018-05-24 VITALS — BP 118/62 | HR 71 | Temp 98.1°F | Resp 16 | Ht 74.0 in | Wt 256.0 lb

## 2018-05-24 DIAGNOSIS — E118 Type 2 diabetes mellitus with unspecified complications: Secondary | ICD-10-CM

## 2018-05-24 DIAGNOSIS — N400 Enlarged prostate without lower urinary tract symptoms: Secondary | ICD-10-CM | POA: Diagnosis not present

## 2018-05-24 DIAGNOSIS — Z0000 Encounter for general adult medical examination without abnormal findings: Secondary | ICD-10-CM | POA: Diagnosis not present

## 2018-05-24 DIAGNOSIS — I10 Essential (primary) hypertension: Secondary | ICD-10-CM | POA: Diagnosis not present

## 2018-05-24 LAB — BASIC METABOLIC PANEL
BUN: 21 mg/dL (ref 6–23)
CALCIUM: 9.6 mg/dL (ref 8.4–10.5)
CO2: 33 mEq/L — ABNORMAL HIGH (ref 19–32)
Chloride: 100 mEq/L (ref 96–112)
Creatinine, Ser: 1.56 mg/dL — ABNORMAL HIGH (ref 0.40–1.50)
GFR: 46.14 mL/min — ABNORMAL LOW (ref >60.00)
GLUCOSE: 112 mg/dL — AB (ref 70–99)
Potassium: 4.1 mEq/L (ref 3.5–5.1)
Sodium: 138 mEq/L (ref 135–145)

## 2018-05-24 LAB — MICROALBUMIN / CREATININE URINE RATIO
Creatinine,U: 159.5 mg/dL
Microalb Creat Ratio: 4 mg/g (ref 0.0–30.0)
Microalb, Ur: 6.3 mg/dL — ABNORMAL HIGH (ref 0.0–1.9)

## 2018-05-24 LAB — TSH: TSH: 1.41 u[IU]/mL (ref 0.35–4.50)

## 2018-05-24 LAB — PSA: PSA: 2.76 ng/mL (ref 0.10–4.00)

## 2018-05-24 LAB — HEMOGLOBIN A1C: Hgb A1c MFr Bld: 6.5 % (ref 4.6–6.5)

## 2018-05-24 NOTE — Progress Notes (Signed)
Subjective:  Patient ID: Jack Farmer, male    DOB: Nov 15, 1940  Age: 77 y.o. MRN: [id number]  CC: Annual Exam; Hypertension; and Diabetes   HPI Taten Merrow presents for a CPX.  He was recently admitted for angina and underwent a cardiac cath.  He was found to have minimal disease.  His chest pain or shortness of breath have resolved but now he complains of persistent weakness and fatigue.  Past Medical History:  Diagnosis Date  . Arthritis   . CAD (coronary artery disease)   . Frequent urination   . GERD (gastroesophageal reflux disease)   . Hyperlipidemia   . Hypertension   . Hypertrophy of prostate with urinary obstruction and other lower urinary tract symptoms (LUTS)   . Low back pain   . Low HDL (under 40)   . Shortness of breath dyspnea    with ambulation  . Skin cancer    removed from face   Past Surgical History:  Procedure Laterality Date  . ANTERIOR LAT LUMBAR FUSION  04/07/2012   Procedure: ANTERIOR LATERAL LUMBAR FUSION 2 LEVELS;  Surgeon: Eustace Moore, MD;  Location: [place] [facility];  Service: Neurosurgery;  Laterality: Right;  Lumbar three-four and four-five extreme lumbar interbody fusion  . BACK SURGERY    . CARDIAC CATHETERIZATION    . COLONOSCOPY W/ POLYPECTOMY    . EYE SURGERY     tightened muscles in right eye  . LEFT HEART CATH AND CORONARY ANGIOGRAPHY N/A 05/03/2018   Procedure: LEFT HEART CATH AND CORONARY ANGIOGRAPHY;  Surgeon: Belva Crome, MD;  Location: [place] [facility];  Service: Cardiovascular;  Laterality: N/A;  . TONSILLECTOMY      reports that he has been smoking cigarettes. He has never used smokeless tobacco. He reports that he drinks alcohol. He reports that he does not use drugs. family history includes Alcohol abuse in his other; Arthritis in his other; Drug abuse in his other; Hypertension in his other. Allergies  Allergen Reactions  . Cephalexin Anaphylaxis  . Indocin [Indomethacin] Other (See Comments)     Gi  bleed    Outpatient Medications Prior to Visit  Medication Sig Dispense Refill  . amLODipine (NORVASC) 5 MG tablet TAKE 1 TABLET BY MOUTH EVERY DAY (Patient taking differently: Take 5 mg by mouth daily. ) 90 tablet 0  . Ascorbic Acid (VITAMIN C) 1000 MG tablet Take 1,000 mg by mouth daily.    Marland Kitchen aspirin EC 81 MG tablet Take 81 mg by mouth daily.    Marland Kitchen b complex vitamins tablet Take 1 tablet by mouth daily.    . chlorthalidone (HYGROTON) 25 MG tablet Take 0.5 tablets (12.5 mg total) by mouth daily. 45 tablet 3  . metoprolol tartrate (LOPRESSOR) 50 MG tablet Take 1 tablet (50 mg total) by mouth 2 (two) times daily. 180 tablet 3  . nitroGLYCERIN (NITROSTAT) 0.4 MG SL tablet Place 1 tablet (0.4 mg total) under the tongue every 5 (five) minutes x 3 doses as needed for chest pain. 25 tablet 3  . Omega-3 Fatty Acids (FISH OIL) 1000 MG CAPS Take 1,000 mg by mouth daily.    . simvastatin (ZOCOR) 40 MG tablet TAKE 1 TABLET BY MOUTH EVERY DAY IN THE EVENING (Patient taking differently: Take 40 mg by mouth daily at 6 PM. ) 90 tablet 1  . tamsulosin (FLOMAX) 0.4 MG CAPS capsule TAKE 1 CAPSULE (0.4 MG TOTAL) BY MOUTH DAILY. OVERDUE FOR YEARLY PHYSICAL MUST SEE MD  FOR REFILLS (Patient taking differently: Take 0.4 mg by mouth daily. ) 30 capsule 0  . telmisartan (MICARDIS) 40 MG tablet Take 1 tablet (40 mg total) by mouth daily. 90 tablet 1  . tiZANidine (ZANAFLEX) 4 MG tablet Take 4 mg by mouth at bedtime.   1  . traMADol (ULTRAM) 50 MG tablet TAKE 1 TABLET BY MOUTH EVERY 6 HOURS AS NEEDED (Patient taking differently: Take 50 mg by mouth every 6 (six) hours as needed. ) 75 tablet 3  . varenicline (CHANTIX CONTINUING MONTH PAK) 1 MG tablet Take 1 tablet (1 mg total) by mouth 2 (two) times daily. 60 tablet 3  . vitamin E 400 UNIT capsule Take 400 Units by mouth daily.     No facility-administered medications prior to visit.     ROS Review of Systems  Constitutional: Positive for fatigue. Negative for  diaphoresis and unexpected weight change.  HENT: Negative.   Eyes: Negative for visual disturbance.  Respiratory: Negative for cough, chest tightness, shortness of breath and wheezing.   Cardiovascular: Negative for chest pain, palpitations and leg swelling.  Gastrointestinal: Negative for abdominal pain, constipation, diarrhea, nausea and vomiting.  Endocrine: Negative.   Genitourinary: Negative.  Negative for difficulty urinating, penile swelling, scrotal swelling, testicular pain and urgency.  Musculoskeletal: Positive for back pain. Negative for arthralgias, myalgias and neck pain.  Skin: Negative for color change, pallor and rash.  Neurological: Negative.  Negative for dizziness, weakness and light-headedness.  Hematological: Negative for adenopathy. Does not bruise/bleed easily.  Psychiatric/Behavioral: Negative.     Objective:  BP 118/62 (BP Location: Left Arm, Patient Position: Sitting, Cuff Size: Large)   Pulse 71   Temp 98.1 F (36.7 C) (Oral)   Resp 16   Ht 6\' 2"  (1.88 m)   Wt 256 lb (116.1 kg)   SpO2 95%   BMI 32.87 kg/m   BP Readings from Last 3 Encounters:  05/24/18 118/62  05/04/18 (!) 142/69  05/02/18 (!) 148/52    Wt Readings from Last 3 Encounters:  05/24/18 256 lb (116.1 kg)  05/04/18 250 lb 14.4 oz (113.8 kg)  05/02/18 252 lb (114.3 kg)    Physical Exam  Constitutional: He is oriented to person, place, and time. No distress.  HENT:  Mouth/Throat: Oropharynx is clear and moist. No oropharyngeal exudate.  Eyes: Conjunctivae are normal. No scleral icterus.  Neck: Normal range of motion. Neck supple. No JVD present. No thyromegaly present.  Cardiovascular: Normal rate, regular rhythm and normal heart sounds. Exam reveals no gallop and no friction rub.  No murmur heard. Pulmonary/Chest: Effort normal and breath sounds normal. No respiratory distress. He has no wheezes. He has no rales.  Abdominal: Soft. Normal appearance and bowel sounds are normal. He  exhibits no mass. There is no hepatosplenomegaly. There is no tenderness. Hernia confirmed negative in the right inguinal area and confirmed negative in the left inguinal area.  Genitourinary: Rectum normal, testes normal and penis normal. Rectal exam shows no external hemorrhoid, no internal hemorrhoid, no fissure, no mass, no tenderness, anal tone normal and guaiac negative stool. Prostate is enlarged (2+ smooth symm BPH). Prostate is not tender. Right testis shows no mass, no swelling and no tenderness. Left testis shows no mass, no swelling and no tenderness. Circumcised. No penile erythema or penile tenderness. No discharge found.  Musculoskeletal: Normal range of motion. He exhibits no edema, tenderness or deformity.  Lymphadenopathy:    He has no cervical adenopathy. No inguinal adenopathy noted on the right  or left side.  Neurological: He is alert and oriented to person, place, and time.  Skin: Skin is warm and dry. No rash noted. He is not diaphoretic.  Psychiatric: He has a normal mood and affect. His behavior is normal. Judgment and thought content normal.  Vitals reviewed.   Lab Results  Component Value Date   WBC 7.6 05/03/2018   HGB 16.4 05/03/2018   HCT 47.5 05/03/2018   PLT 185 05/03/2018   GLUCOSE 112 (H) 05/24/2018   CHOL 100 05/03/2018   TRIG 176 (H) 05/03/2018   HDL 27 (L) 05/03/2018   LDLDIRECT 46.0 10/26/2017   LDLCALC 38 05/03/2018   ALT 27 05/02/2018   AST 20 05/02/2018   NA 138 05/24/2018   K 4.1 05/24/2018   CL 100 05/24/2018   CREATININE 1.56 (H) 05/24/2018   BUN 21 05/24/2018   CO2 33 (H) 05/24/2018   TSH 1.41 05/24/2018   PSA 2.76 05/24/2018   INR 0.96 09/10/2014   HGBA1C 6.5 05/24/2018   MICROALBUR 6.3 (H) 05/24/2018    Dg Chest 2 View  Result Date: 05/02/2018 CLINICAL DATA:  Chest pain EXAM: CHEST - 2 VIEW COMPARISON:  10/24/2014 FINDINGS: No focal opacity or pleural effusion. Cardiomediastinal silhouette within normal limits. Aortic  atherosclerosis. No pneumothorax. Degenerative changes of the spine. IMPRESSION: No active cardiopulmonary disease. Electronically Signed   By: Donavan Foil M.D.   On: 05/02/2018 16:44    Assessment & Plan:   Eliezer was seen today for annual exam, hypertension and diabetes.  Diagnoses and all orders for this visit:  Type 2 diabetes mellitus with complication, without long-term current use of insulin ([place])- His A1C is at 6.5%.  His blood sugars are adequately well controlled.  Medical therapy is not indicated. -     Hemoglobin A1c; Future -     Microalbumin / creatinine urine ratio; Future -     Basic metabolic panel; Future  Benign prostatic hyperplasia without lower urinary tract symptoms- His PSA is normal which is reassuring that he does not have prostate cancer.  He has no symptoms that need to be treated. -     PSA; Future  Essential hypertension- His blood pressure is adequately well controlled.  Electrolytes are normal. -     Basic metabolic panel; Future -     TSH; Future  Routine general medical examination at a health care facility   I am having Kirtis B. Ewing Schlein maintain his aspirin EC, vitamin C, vitamin E, b complex vitamins, Fish Oil, tamsulosin, tiZANidine, varenicline, amLODipine, simvastatin, traMADol, chlorthalidone, metoprolol tartrate, nitroGLYCERIN, and telmisartan.  No orders of the defined types were placed in this encounter.  See AVS for instructions about healthy living and anticipatory guidance.   Follow-up: Return in about 6 months (around 11/23/2018).  Scarlette Calico, MD

## 2018-05-24 NOTE — Patient Instructions (Signed)

## 2018-05-25 NOTE — Assessment & Plan Note (Signed)

## 2018-06-06 ENCOUNTER — Ambulatory Visit: Payer: Medicare Other | Admitting: Cardiology

## 2018-06-06 ENCOUNTER — Encounter: Payer: Self-pay | Admitting: Cardiology

## 2018-06-06 VITALS — BP 120/62 | HR 68 | Ht 74.0 in | Wt 257.0 lb

## 2018-06-06 DIAGNOSIS — I119 Hypertensive heart disease without heart failure: Secondary | ICD-10-CM | POA: Diagnosis not present

## 2018-06-06 DIAGNOSIS — N183 Chronic kidney disease, stage 3 unspecified: Secondary | ICD-10-CM | POA: Insufficient documentation

## 2018-06-06 DIAGNOSIS — E785 Hyperlipidemia, unspecified: Secondary | ICD-10-CM

## 2018-06-06 DIAGNOSIS — R5383 Other fatigue: Secondary | ICD-10-CM

## 2018-06-06 DIAGNOSIS — I2510 Atherosclerotic heart disease of native coronary artery without angina pectoris: Secondary | ICD-10-CM

## 2018-06-06 HISTORY — DX: Other fatigue: R53.83

## 2018-06-06 HISTORY — DX: Chronic kidney disease, stage 3 unspecified: N18.30

## 2018-06-06 MED ORDER — ISOSORBIDE MONONITRATE ER 30 MG PO TB24: 30.0000 mg | ORAL_TABLET | Freq: Every day | ORAL | 3 refills | Status: DC

## 2018-06-06 NOTE — Assessment & Plan Note (Signed)
Echo Sept 2019- EF 60-65% with moderate LVH and grade 2 DD 

## 2018-06-06 NOTE — Assessment & Plan Note (Signed)
Pt's main complaint is exertional fatigue and dyspnea.

## 2018-06-06 NOTE — Progress Notes (Signed)
06/06/2018 Jack Farmer   [birth year]  [id number]  Primary Physician Janith Lima, MD Primary Cardiologist: Dr Stanford Breed   HPI:  Pleasant 77 y/o male followed by Dr Stanford Breed with a history of moderate CAD at cath in 2008, HTN, HLD, and DM. He is in the office today as a post hospital follow up. He was admitted 05/02/18 with chest pain and dyspnea. He was sent by his PCP. He went to his PCPs office 05/02/18 with complaints of SOB and chest pain and was sent to the ED. He ruled out for an MI. Cath done 05/02/18 showed moderate CAD with a 30-40% Dx1, 60-70% OM2, 50-60% RCA. His LVF was normal 60-65%. He did have grade 2 DD but his LVEDP was normal. Plan is for medical Rx and Lopressor 50 mg BID was added. On admission it was noted his SCr was a little higher than usual and his diuretic was held. He was discharged 05/04/18. F/U BMP done 10/9 showed a slight bump in his SCR to 1.56.   Today in the office he says he feels terrible, no different than when he went to the hospital. Last evening he said he wheeled his wife to the car-10-15 yards, and felt SOB, weak, and chest pressure. He did not take a NTG but considered it. He has multiple other complaints- fatigue, sweating, hip pain. He sleeps in a recliner because of his hip pain. An orthopedist told him he had bursitis. He admits he has not felt well for some time. He cares for his wife who has Alzheimer's and CRI. Recently his daughter moved back home to help out.    Current Outpatient Medications  Medication Sig Dispense Refill  . amLODipine (NORVASC) 5 MG tablet TAKE 1 TABLET BY MOUTH EVERY DAY (Patient taking differently: Take 5 mg by mouth daily. ) 90 tablet 0  . Ascorbic Acid (VITAMIN C) 1000 MG tablet Take 1,000 mg by mouth daily.    Marland Kitchen aspirin EC 81 MG tablet Take 81 mg by mouth daily.    Marland Kitchen b complex vitamins tablet Take 1 tablet by mouth daily.    . chlorthalidone (HYGROTON) 25 MG tablet Take 0.5 tablets (12.5 mg total) by mouth  daily. 45 tablet 3  . metoprolol tartrate (LOPRESSOR) 50 MG tablet Take 1 tablet (50 mg total) by mouth 2 (two) times daily. 180 tablet 3  . nitroGLYCERIN (NITROSTAT) 0.4 MG SL tablet Place 1 tablet (0.4 mg total) under the tongue every 5 (five) minutes x 3 doses as needed for chest pain. 25 tablet 3  . Omega-3 Fatty Acids (FISH OIL) 1000 MG CAPS Take 1,000 mg by mouth daily.    . simvastatin (ZOCOR) 40 MG tablet TAKE 1 TABLET BY MOUTH EVERY DAY IN THE EVENING (Patient taking differently: Take 40 mg by mouth daily at 6 PM. ) 90 tablet 1  . tamsulosin (FLOMAX) 0.4 MG CAPS capsule TAKE 1 CAPSULE (0.4 MG TOTAL) BY MOUTH DAILY. OVERDUE FOR YEARLY PHYSICAL MUST SEE MD FOR REFILLS (Patient taking differently: Take 0.4 mg by mouth daily. ) 30 capsule 0  . telmisartan (MICARDIS) 40 MG tablet Take 1 tablet (40 mg total) by mouth daily. 90 tablet 1  . tiZANidine (ZANAFLEX) 4 MG tablet Take 4 mg by mouth at bedtime.   1  . traMADol (ULTRAM) 50 MG tablet TAKE 1 TABLET BY MOUTH EVERY 6 HOURS AS NEEDED (Patient taking differently: Take 50 mg by mouth every 6 (six) hours as needed. ) 75  tablet 3  . vitamin E 400 UNIT capsule Take 400 Units by mouth daily.    . isosorbide mononitrate (IMDUR) 30 MG 24 hr tablet Take 1 tablet (30 mg total) by mouth daily. 30 tablet 3   No current facility-administered medications for this visit.     Allergies  Allergen Reactions  . Cephalexin Anaphylaxis  . Indocin [Indomethacin] Other (See Comments)     Gi bleed    Past Medical History:  Diagnosis Date  . Arthritis   . CAD (coronary artery disease) 05/03/2018   moderate CAD, normal LVF- medical rx  . Frequent urination   . GERD (gastroesophageal reflux disease)   . Hyperlipidemia   . Hypertension 05/03/2018   normal LVF, moderate LVH, grade 2 DD  . Hypertrophy of prostate with urinary obstruction and other lower urinary tract symptoms (LUTS)   . Low back pain   . Low HDL (under 40)   . Shortness of breath dyspnea      with ambulation  . Skin cancer    removed from face    Social History   Socioeconomic History  . Marital status: Married    Spouse name: Not on file  . Number of children: 4  . Years of education: Not on file  . Highest education level: Not on file  Occupational History  . Not on file  Social Needs  . Financial resource strain: Not on file  . Food insecurity:    Worry: Not on file    Inability: Not on file  . Transportation needs:    Medical: Not on file    Non-medical: Not on file  Tobacco Use  . Smoking status: Current Some Day Smoker    Types: Cigarettes    Last attempt to quit: 08/16/2006    Years since quitting: 11.8  . Smokeless tobacco: Never Used  Substance and Sexual Activity  . Alcohol use: Yes    Comment: Rare  . Drug use: No  . Sexual activity: Yes  Lifestyle  . Physical activity:    Days per week: Not on file    Minutes per session: Not on file  . Stress: Not on file  Relationships  . Social connections:    Talks on phone: Not on file    Gets together: Not on file    Attends religious service: Not on file    Active member of club or organization: Not on file    Attends meetings of clubs or organizations: Not on file    Relationship status: Not on file  . Intimate partner violence:    Fear of current or ex partner: Not on file    Emotionally abused: Not on file    Physically abused: Not on file    Forced sexual activity: Not on file  Other Topics Concern  . Not on file  Social History Narrative  . Not on file     Family History  Problem Relation Age of Onset  . Alcohol abuse Other   . Drug abuse Other   . Arthritis Other   . Hypertension Other      Review of Systems: General: negative for chills, fever, night sweats or weight changes.  Cardiovascular: negative for edema, orthopnea, palpitations, paroxysmal nocturnal dyspnea  Dermatological: negative for rash Respiratory: negative for cough or wheezing Urologic: negative for  hematuria-he does complain of frequent urination at night Abdominal: negative for nausea, vomiting, diarrhea, bright red blood per rectum, melena, or hematemesis Neurologic: negative for visual changes,  syncope, or dizziness All other systems reviewed and are otherwise negative except as noted above.    Blood pressure 120/62, pulse 68, height 6\' 2"  (1.88 m), weight 257 lb (116.6 kg), SpO2 93 % on RA  General appearance: alert, cooperative, no distress and moderately obese Neck: no carotid bruit and no JVD Lungs: decreased Lt base Heart: regular rate and rhythm Extremities: no edema Skin: pale, cool, clammy Neurologic: Grossly normal  EKG NSR, sinus arrythmia  ASSESSMENT AND PLAN:   Fatigue Pt's main complaint is exertional fatigue and dyspnea.   CAD (coronary artery disease), native coronary artery Cath 2008- medical Rx. 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 I suggested we try Imdur 30 mg daily. I also suggested we try a statin holiday with plans to resume his statin in two weeks if he doesn't notice significant improvement in his symptoms. He has a follow up in Dec with Dr Stanford Breed and will keep this.   Kerin Ransom PA-C 06/06/2018 9:36 AM

## 2018-06-06 NOTE — Assessment & Plan Note (Signed)
Cath 2008- medical Rx. Cath for chest pain 05/03/18- 30-40% Dx1, 60-70% OM2, 50-60% RCA Normal LVF- normal LVEDP

## 2018-06-06 NOTE — Patient Instructions (Signed)
Medication Instructions:  START Imdur 30mg  (Take half a tablet for 3 days then on the fourth day take a whole tablet once a day) HOLD Zocor for 2 weeks to see if your symptoms get better. If they do not get better restart Zocor. If you need a refill on your cardiac medications before your next appointment, please call your pharmacy.   Lab work: Your physician recommends that you return for lab work in: TODAY-BMET If you have labs (blood work) drawn today and your tests are completely normal, you will receive your results only by: Marland Kitchen MyChart Message (if you have MyChart) OR . A paper copy in the mail If you have any lab test that is abnormal or we need to change your treatment, we will call you to review the results.  Testing/Procedures: NONE   Follow-Up: At [facility], you and your health needs are our priority.  As part of our continuing mission to provide you with exceptional heart care, we have created designated Provider Care Teams.  These Care Teams include your primary Cardiologist (physician) and Advanced Practice Providers (APPs -  Physician Assistants and Nurse Practitioners) who all work together to provide you with the care you need, when you need it. FOLLOW UP AS SCHEDULED WITH DR CRENSHAW.  Any Other Special Instructions Will Be Listed Below (If Applicable). NONE

## 2018-06-06 NOTE — Assessment & Plan Note (Signed)
LDL 38 05/03/18 on Zocor 40 mg

## 2018-06-06 NOTE — Assessment & Plan Note (Signed)
SCr 1.56 Oct 2019

## 2018-06-07 LAB — BASIC METABOLIC PANEL
BUN/Creatinine Ratio: 13 (ref 10–24)
BUN: 19 mg/dL (ref 8–27)
CO2: 25 mmol/L (ref 20–29)
Calcium: 9.5 mg/dL (ref 8.6–10.2)
Chloride: 96 mmol/L (ref 96–106)
Creatinine, Ser: 1.43 mg/dL — ABNORMAL HIGH (ref 0.76–1.27)
GFR calc Af Amer: 55 mL/min/{1.73_m2} — ABNORMAL LOW (ref >59)
GFR calc non Af Amer: 47 mL/min/{1.73_m2} — ABNORMAL LOW (ref >59)
Glucose: 193 mg/dL — ABNORMAL HIGH (ref 65–99)
Potassium: 3.9 mmol/L (ref 3.5–5.2)
Sodium: 136 mmol/L (ref 134–144)

## 2018-06-12 ENCOUNTER — Other Ambulatory Visit: Payer: Self-pay | Admitting: Internal Medicine

## 2018-06-12 DIAGNOSIS — I10 Essential (primary) hypertension: Secondary | ICD-10-CM

## 2018-06-14 LAB — HM DIABETES EYE EXAM

## 2018-06-28 ENCOUNTER — Encounter: Payer: Self-pay | Admitting: Family

## 2018-06-28 ENCOUNTER — Ambulatory Visit (INDEPENDENT_AMBULATORY_CARE_PROVIDER_SITE_OTHER): Payer: Medicare Other | Admitting: Family

## 2018-06-28 DIAGNOSIS — I10 Essential (primary) hypertension: Secondary | ICD-10-CM

## 2018-06-28 MED ORDER — TIZANIDINE HCL 4 MG PO TABS: 4.0000 mg | ORAL_TABLET | Freq: Every day | ORAL | 1 refills | Status: DC

## 2018-06-28 MED ORDER — CHLORTHALIDONE 25 MG PO TABS: 25.0000 mg | ORAL_TABLET | Freq: Every day | ORAL | 3 refills | Status: DC

## 2018-06-28 NOTE — Progress Notes (Signed)
Jack Farmer is a 77 y.o. male with the following history as recorded in Epic[employer]:  Patient Active Problem List   Diagnosis Date Noted  . CRI (chronic renal insufficiency), stage 3 (moderate) ([place]) 06/06/2018  . Fatigue 06/06/2018  . Trochanteric bursitis of both hips 10/26/2017  . Tobacco abuse 10/26/2017  . Type 2 diabetes mellitus with complication, without long-term current use of insulin ([place]) 10/26/2017  . Abnormal electrocardiogram (ECG) (EKG) 04/11/2017  . Eczema 04/11/2017  . Primary insomnia 11/22/2016  . BPH (benign prostatic hyperplasia) 10/17/2014  . Atherosclerosis of aorta ([place]) 06/25/2013  . Routine general medical examination at a health care facility 08/29/2011  . ERECTILE DYSFUNCTION 01/08/2010  . Hyperlipidemia with target LDL less than 70 12/30/2008  . GERD 12/30/2008  . CAD (coronary artery disease), native coronary artery 12/26/2008  . Hypertensive cardiovascular disease 09/04/2008  . Osteoarthritis 09/04/2008    Current Outpatient Medications  Medication Sig Dispense Refill  . amLODipine (NORVASC) 5 MG tablet TAKE 1 TABLET BY MOUTH EVERY DAY 90 tablet 1  . Ascorbic Acid (VITAMIN C) 1000 MG tablet Take 1,000 mg by mouth daily.    Marland Kitchen aspirin EC 81 MG tablet Take 81 mg by mouth daily.    Marland Kitchen b complex vitamins tablet Take 1 tablet by mouth daily.    . chlorthalidone (HYGROTON) 25 MG tablet Take 1 tablet (25 mg total) by mouth daily. 45 tablet 3  . isosorbide mononitrate (IMDUR) 30 MG 24 hr tablet Take 1 tablet (30 mg total) by mouth daily. 30 tablet 3  . metoprolol tartrate (LOPRESSOR) 50 MG tablet Take 1 tablet (50 mg total) by mouth 2 (two) times daily. 180 tablet 3  . nitroGLYCERIN (NITROSTAT) 0.4 MG SL tablet Place 1 tablet (0.4 mg total) under the tongue every 5 (five) minutes x 3 doses as needed for chest pain. 25 tablet 3  . Omega-3 Fatty Acids (FISH OIL) 1000 MG CAPS Take 1,000 mg by mouth daily.    . simvastatin (ZOCOR) 40 MG tablet TAKE 1 TABLET  BY MOUTH EVERY DAY IN THE EVENING (Patient taking differently: Take 40 mg by mouth daily at 6 PM. ) 90 tablet 1  . tamsulosin (FLOMAX) 0.4 MG CAPS capsule TAKE 1 CAPSULE (0.4 MG TOTAL) BY MOUTH DAILY. OVERDUE FOR YEARLY PHYSICAL MUST SEE MD FOR REFILLS (Patient taking differently: Take 0.4 mg by mouth daily. ) 30 capsule 0  . telmisartan (MICARDIS) 40 MG tablet Take 1 tablet (40 mg total) by mouth daily. 90 tablet 1  . tiZANidine (ZANAFLEX) 4 MG tablet Take 1 tablet (4 mg total) by mouth at bedtime. 30 tablet 1  . traMADol (ULTRAM) 50 MG tablet TAKE 1 TABLET BY MOUTH EVERY 6 HOURS AS NEEDED (Patient taking differently: Take 50 mg by mouth every 6 (six) hours as needed. ) 75 tablet 3  . vitamin E 400 UNIT capsule Take 400 Units by mouth daily.     No current facility-administered medications for this visit.     Allergies: Cephalexin and Indocin [indomethacin]  Past Medical History:  Diagnosis Date  . Arthritis   . CAD (coronary artery disease) 05/03/2018   moderate CAD, normal LVF- medical rx  . Frequent urination   . GERD (gastroesophageal reflux disease)   . Hyperlipidemia   . Hypertension 05/03/2018   normal LVF, moderate LVH, grade 2 DD  . Hypertrophy of prostate with urinary obstruction and other lower urinary tract symptoms (LUTS)   . Low back pain   . Low  HDL (under 40)   . Shortness of breath dyspnea    with ambulation  . Skin cancer    removed from face    Past Surgical History:  Procedure Laterality Date  . ANTERIOR LAT LUMBAR FUSION  04/07/2012   Procedure: ANTERIOR LATERAL LUMBAR FUSION 2 LEVELS;  Surgeon: Eustace Moore, MD;  Location: [place] [facility];  Service: Neurosurgery;  Laterality: Right;  Lumbar three-four and four-five extreme lumbar interbody fusion  . BACK SURGERY    . CARDIAC CATHETERIZATION    . COLONOSCOPY W/ POLYPECTOMY    . EYE SURGERY     tightened muscles in right eye  . LEFT HEART CATH AND CORONARY ANGIOGRAPHY N/A 05/03/2018   Procedure: LEFT HEART CATH  AND CORONARY ANGIOGRAPHY;  Surgeon: Belva Crome, MD;  Location: [place] [facility];  Service: Cardiovascular;  Laterality: N/A;  . TONSILLECTOMY      Family History  Problem Relation Age of Onset  . Alcohol abuse Other   . Drug abuse Other   . Arthritis Other   . Hypertension Other     Social History   Tobacco Use  . Smoking status: Current Some Day Smoker    Types: Cigarettes    Last attempt to quit: 08/16/2006    Years since quitting: 11.8  . Smokeless tobacco: Never Used  Substance Use Topics  . Alcohol use: Yes    Comment: Rare    Subjective:   Patient presents with concerns that his blood pressure is not well controlled in the evenings- seems to be averaging in the 168/64 between 7 and 9 pm; Takes all of his blood pressure medications in the morning; takes aspirin, Zocor, Metoprolol, Tizanidine and Tramadol at 9 pm; Would like to review his regimen- wonders about moving his Amlodipine to night-time;  Denies any chest pain, shortness of breath, blurred vision or headache.   Objective:  Vitals:   06/28/18 1043  BP: 116/62  Pulse: 75  Temp: 98.1 F (36.7 C)  TempSrc: Oral  SpO2: 91%  Weight: 259 lb (117.5 kg)  Height: 6\' 2"  (1.88 m)    General: Well developed, well nourished, in no acute distress  Skin : Warm and dry.  Head: Normocephalic and atraumatic  Eyes: Sclera and conjunctiva clear; pupils round and reactive to light; extraocular movements intact  Ears: External normal; canals clear; tympanic membranes normal  Oropharynx: Pink, supple. No suspicious lesions  Neck: Supple without thyromegaly, adenopathy  Lungs: Respirations unlabored; clear to auscultation bilaterally without wheeze, rales, rhonchi  CVS exam: normal rate and regular rhythm.  Neurologic: Alert and oriented; speech intact; face symmetrical; moves all extremities well; CNII-XII intact without focal deficit   Assessment:  1. Essential hypertension     Plan:  Agree that timing of regimen  needs to be adjusted; will try moving Amlodipine to night-time and keep other medications in the am; continue to monitor and call back with response; follow-up to be determined.   No follow-ups on file.  No orders of the defined types were placed in this encounter.   Requested Prescriptions   Signed Prescriptions Disp Refills  . chlorthalidone (HYGROTON) 25 MG tablet 45 tablet 3    Sig: Take 1 tablet (25 mg total) by mouth daily.  Marland Kitchen tiZANidine (ZANAFLEX) 4 MG tablet 30 tablet 1    Sig: Take 1 tablet (4 mg total) by mouth at bedtime.

## 2018-07-03 ENCOUNTER — Telehealth: Payer: Self-pay | Admitting: Family

## 2018-07-03 ENCOUNTER — Ambulatory Visit: Payer: Medicare Other | Admitting: Family

## 2018-07-03 NOTE — Telephone Encounter (Signed)
Please let him know that he doesn't need to come back in today; let's have him try taking his Micardis 40 mg bid and see if that helps. Let me know how his pressure is doing.

## 2018-07-03 NOTE — Telephone Encounter (Signed)
Spoke with patient and info given. Will cancel appointment for him today.

## 2018-07-17 NOTE — Progress Notes (Signed)
HPI: FU coronary artery disease. Abdominal ultrasound in August of 2010 showed no aneurysm.  Last cardiac catheterization September 2019 showed 30 to 40% first diagonal, 60 to 70% circumflex, 50% obtuse marginal, 50 to 60% RCA, 50 to 60% PDA and ejection fraction 60%.  Left ventricular end-diastolic pressure 18.  Medical therapy recommended.  Echocardiogram September 2019 showed normal LV function, moderate diastolic dysfunction and moderate left ventricular hypertrophy.  Since last seen, he does have dyspnea on exertion but no orthopnea, PND or pedal edema.  He has chest heaviness that can last 1/2-hour at a time.  Not exertional.  Thinks it may be related to elevated blood pressure.  Has episodes of dizziness that can last hours.  Some increased with standing.  No syncope.  Also with elevated blood pressure in the evening and medications recently adjusted.  Current Outpatient Medications  Medication Sig Dispense Refill  . amLODipine (NORVASC) 5 MG tablet TAKE 1 TABLET BY MOUTH EVERY DAY 90 tablet 1  . Ascorbic Acid (VITAMIN C) 1000 MG tablet Take 1,000 mg by mouth daily.    Marland Kitchen aspirin EC 81 MG tablet Take 81 mg by mouth daily.    Marland Kitchen b complex vitamins tablet Take 1 tablet by mouth daily.    . chlorthalidone (HYGROTON) 25 MG tablet Take 1 tablet (25 mg total) by mouth daily. 45 tablet 3  . isosorbide mononitrate (IMDUR) 30 MG 24 hr tablet Take 1 tablet (30 mg total) by mouth daily. 30 tablet 3  . metoprolol tartrate (LOPRESSOR) 50 MG tablet Take 1 tablet (50 mg total) by mouth 2 (two) times daily. 180 tablet 3  . nitroGLYCERIN (NITROSTAT) 0.4 MG SL tablet Place 1 tablet (0.4 mg total) under the tongue every 5 (five) minutes x 3 doses as needed for chest pain. 25 tablet 3  . Omega-3 Fatty Acids (FISH OIL) 1000 MG CAPS Take 1,000 mg by mouth daily.    . simvastatin (ZOCOR) 40 MG tablet TAKE 1 TABLET BY MOUTH EVERY DAY IN THE EVENING (Patient taking differently: Take 40 mg by mouth daily at 6 PM.  ) 90 tablet 1  . telmisartan (MICARDIS) 40 MG tablet Take 1 tablet (40 mg total) by mouth daily. (Patient taking differently: Take 40 mg by mouth 2 (two) times daily. ) 90 tablet 1  . tiZANidine (ZANAFLEX) 4 MG tablet Take 1 tablet (4 mg total) by mouth at bedtime. 30 tablet 1  . traMADol (ULTRAM) 50 MG tablet TAKE 1 TABLET BY MOUTH EVERY 6 HOURS AS NEEDED (Patient taking differently: Take 50 mg by mouth every 6 (six) hours as needed. ) 75 tablet 3  . vitamin E 400 UNIT capsule Take 400 Units by mouth daily.     No current facility-administered medications for this visit.      Past Medical History:  Diagnosis Date  . Arthritis   . CAD (coronary artery disease) 05/03/2018   moderate CAD, normal LVF- medical rx  . Frequent urination   . GERD (gastroesophageal reflux disease)   . Hyperlipidemia   . Hypertension 05/03/2018   normal LVF, moderate LVH, grade 2 DD  . Hypertrophy of prostate with urinary obstruction and other lower urinary tract symptoms (LUTS)   . Low back pain   . Low HDL (under 40)   . Shortness of breath dyspnea    with ambulation  . Skin cancer    removed from face    Past Surgical History:  Procedure Laterality Date  . ANTERIOR  LAT LUMBAR FUSION  04/07/2012   Procedure: ANTERIOR LATERAL LUMBAR FUSION 2 LEVELS;  Surgeon: Eustace Moore, MD;  Location: [place] [facility];  Service: Neurosurgery;  Laterality: Right;  Lumbar three-four and four-five extreme lumbar interbody fusion  . BACK SURGERY    . CARDIAC CATHETERIZATION    . COLONOSCOPY W/ POLYPECTOMY    . EYE SURGERY     tightened muscles in right eye  . LEFT HEART CATH AND CORONARY ANGIOGRAPHY N/A 05/03/2018   Procedure: LEFT HEART CATH AND CORONARY ANGIOGRAPHY;  Surgeon: Belva Crome, MD;  Location: [place] [facility];  Service: Cardiovascular;  Laterality: N/A;  . TONSILLECTOMY      Social History   Socioeconomic History  . Marital status: Married    Spouse name: Not on file  . Number of children: 4  .  Years of education: Not on file  . Highest education level: Not on file  Occupational History  . Not on file  Social Needs  . Financial resource strain: Not on file  . Food insecurity:    Worry: Not on file    Inability: Not on file  . Transportation needs:    Medical: Not on file    Non-medical: Not on file  Tobacco Use  . Smoking status: Current Some Day Smoker    Types: Cigarettes    Last attempt to quit: 08/16/2006    Years since quitting: 11.9  . Smokeless tobacco: Never Used  Substance and Sexual Activity  . Alcohol use: Yes    Comment: Rare  . Drug use: No  . Sexual activity: Yes  Lifestyle  . Physical activity:    Days per week: Not on file    Minutes per session: Not on file  . Stress: Not on file  Relationships  . Social connections:    Talks on phone: Not on file    Gets together: Not on file    Attends religious service: Not on file    Active member of club or organization: Not on file    Attends meetings of clubs or organizations: Not on file    Relationship status: Not on file  . Intimate partner violence:    Fear of current or ex partner: Not on file    Emotionally abused: Not on file    Physically abused: Not on file    Forced sexual activity: Not on file  Other Topics Concern  . Not on file  Social History Narrative  . Not on file    Family History  Problem Relation Age of Onset  . Alcohol abuse Other   . Drug abuse Other   . Arthritis Other   . Hypertension Other     ROS: Back and hip pain but no fevers or chills, productive cough, hemoptysis, dysphasia, odynophagia, melena, hematochezia, dysuria, hematuria, rash, seizure activity, orthopnea, PND, pedal edema, claudication. Remaining systems are negative.  Physical Exam: Well-developed well-nourished in no acute distress.  Skin is warm and dry.  HEENT is normal.  Neck is supple.  Chest is clear to auscultation with normal expansion.  Cardiovascular exam is regular rate and rhythm.    Abdominal exam nontender or distended. No masses palpated. Extremities show no edema. neuro grossly intact   A/P  1 coronary artery disease-patient with recent chest heaviness.  Recent catheterization revealed moderate coronary disease.  Continue medical therapy with aspirin and statin.  2 hypertension-patient states his blood pressure is controlled in the morning but elevated in the evening.  Medications were recently adjusted.  We will follow blood pressure and adjust regimen as needed.  3 hyperlipidemia-continue statin.  Lipids and liver monitored by primary care.  4 chronic stage III kidney disease-followed by nephrology.  5 dyspnea-etiology unclear.  Recent left ventricular end-diastolic pressure normal.  We will continue present dose of diuretic.  Check BNP.  If elevated will advance diuretics and follow.  I will check PYP scan to screen for amyloid.  Kirk Ruths, MD

## 2018-07-20 ENCOUNTER — Encounter: Payer: Self-pay | Admitting: Internal Medicine

## 2018-07-20 ENCOUNTER — Ambulatory Visit (INDEPENDENT_AMBULATORY_CARE_PROVIDER_SITE_OTHER)
Admission: RE | Admit: 2018-07-20 | Discharge: 2018-07-20 | Disposition: A | Discharge status: Home / Self Care | Payer: Medicare Other | Source: Ambulatory Visit | Attending: Internal Medicine | Admitting: Internal Medicine

## 2018-07-20 ENCOUNTER — Ambulatory Visit (INDEPENDENT_AMBULATORY_CARE_PROVIDER_SITE_OTHER): Payer: Medicare Other | Admitting: Internal Medicine

## 2018-07-20 VITALS — BP 132/60 | HR 82 | Temp 98.4°F | Ht 74.0 in | Wt 263.0 lb

## 2018-07-20 DIAGNOSIS — R059 Cough, unspecified: Secondary | ICD-10-CM

## 2018-07-20 DIAGNOSIS — B9789 Other viral agents as the cause of diseases classified elsewhere: Secondary | ICD-10-CM | POA: Diagnosis not present

## 2018-07-20 DIAGNOSIS — J069 Acute upper respiratory infection, unspecified: Secondary | ICD-10-CM

## 2018-07-20 DIAGNOSIS — N3281 Overactive bladder: Secondary | ICD-10-CM

## 2018-07-20 DIAGNOSIS — R05 Cough: Secondary | ICD-10-CM | POA: Diagnosis not present

## 2018-07-20 HISTORY — DX: Cough, unspecified: R05.9

## 2018-07-20 HISTORY — DX: Overactive bladder: N32.81

## 2018-07-20 HISTORY — DX: Acute upper respiratory infection, unspecified: J06.9

## 2018-07-20 LAB — POCT EXHALED NITRIC OXIDE: FENO LEVEL (PPB): 34

## 2018-07-20 MED ORDER — SOLIFENACIN SUCCINATE 10 MG PO TABS: 10.0000 mg | ORAL_TABLET | Freq: Every day | ORAL | 1 refills | Status: DC

## 2018-07-20 MED ORDER — HYDROCODONE-HOMATROPINE 5-1.5 MG/5ML PO SYRP: 5.0000 mL | ORAL_SOLUTION | Freq: Three times a day (TID) | ORAL | 0 refills | Status: AC | PRN

## 2018-07-20 NOTE — Patient Instructions (Signed)
Cough, Adult  Coughing is a reflex that clears your throat and your airways. Coughing helps to heal and protect your lungs. It is normal to cough occasionally, but a cough that happens with other symptoms or lasts a long time may be a sign of a condition that needs treatment. A cough may last only 2-3 weeks (acute), or it may last longer than 8 weeks (chronic).  What are the causes?  Coughing is commonly caused by:   Breathing in substances that irritate your lungs.   A viral or bacterial respiratory infection.   Allergies.   Asthma.   Postnasal drip.   Smoking.   Acid backing up from the stomach into the esophagus (gastroesophageal reflux).   Certain medicines.   Chronic lung problems, including COPD (or rarely, lung cancer).   Other medical conditions such as heart failure.    Follow these instructions at home:  Pay attention to any changes in your symptoms. Take these actions to help with your discomfort:   Take medicines only as told by your health care provider.  ? If you were prescribed an antibiotic medicine, take it as told by your health care provider. Do not stop taking the antibiotic even if you start to feel better.  ? Talk with your health care provider before you take a cough suppressant medicine.   Drink enough fluid to keep your urine clear or pale yellow.   If the air is dry, use a cold steam vaporizer or humidifier in your bedroom or your home to help loosen secretions.   Avoid anything that causes you to cough at work or at home.   If your cough is worse at night, try sleeping in a semi-upright position.   Avoid cigarette smoke. If you smoke, quit smoking. If you need help quitting, ask your health care provider.   Avoid caffeine.   Avoid alcohol.   Rest as needed.    Contact a health care provider if:   You have new symptoms.   You cough up pus.   Your cough does not get better after 2-3 weeks, or your cough gets worse.   You cannot control your cough with suppressant  medicines and you are losing sleep.   You develop pain that is getting worse or pain that is not controlled with pain medicines.   You have a fever.   You have unexplained weight loss.   You have night sweats.  Get help right away if:   You cough up blood.   You have difficulty breathing.   Your heartbeat is very fast.  This information is not intended to replace advice given to you by your health care provider. Make sure you discuss any questions you have with your health care provider.  Document Released: 01/29/2011 Document Revised: 01/08/2016 Document Reviewed: 10/09/2014  Elsevier Interactive Patient Education  2018 [employer] Inc[employer]

## 2018-07-20 NOTE — Progress Notes (Signed)
Subjective:  Patient ID: Jack Farmer, male    DOB: 12/15/1940  Age: 77 y.o. MRN: [id number]  CC: Cough   HPI Jack Farmer presents for a 4-day history of nonproductive cough with low-grade fever and sore throat.  He is not getting adequate symptom relief with Delsym.  He also complains of frequent urination and nocturia, up to 3 times per night.  Outpatient Medications Prior to Visit  Medication Sig Dispense Refill  . amLODipine (NORVASC) 5 MG tablet TAKE 1 TABLET BY MOUTH EVERY DAY 90 tablet 1  . Ascorbic Acid (VITAMIN C) 1000 MG tablet Take 1,000 mg by mouth daily.    Marland Kitchen aspirin EC 81 MG tablet Take 81 mg by mouth daily.    Marland Kitchen b complex vitamins tablet Take 1 tablet by mouth daily.    . chlorthalidone (HYGROTON) 25 MG tablet Take 1 tablet (25 mg total) by mouth daily. (Patient taking differently: Take 25 mg by mouth 2 (two) times daily. ) 45 tablet 3  . isosorbide mononitrate (IMDUR) 30 MG 24 hr tablet Take 1 tablet (30 mg total) by mouth daily. 30 tablet 3  . metoprolol tartrate (LOPRESSOR) 50 MG tablet Take 1 tablet (50 mg total) by mouth 2 (two) times daily. 180 tablet 3  . Omega-3 Fatty Acids (FISH OIL) 1000 MG CAPS Take 1,000 mg by mouth daily.    . simvastatin (ZOCOR) 40 MG tablet TAKE 1 TABLET BY MOUTH EVERY DAY IN THE EVENING (Patient taking differently: Take 40 mg by mouth daily at 6 PM. ) 90 tablet 1  . tamsulosin (FLOMAX) 0.4 MG CAPS capsule TAKE 1 CAPSULE (0.4 MG TOTAL) BY MOUTH DAILY. OVERDUE FOR YEARLY PHYSICAL MUST SEE MD FOR REFILLS (Patient taking differently: Take 0.4 mg by mouth daily. ) 30 capsule 0  . telmisartan (MICARDIS) 40 MG tablet Take 1 tablet (40 mg total) by mouth daily. (Patient taking differently: Take 40 mg by mouth 2 (two) times daily. ) 90 tablet 1  . tiZANidine (ZANAFLEX) 4 MG tablet Take 1 tablet (4 mg total) by mouth at bedtime. 30 tablet 1  . traMADol (ULTRAM) 50 MG tablet TAKE 1 TABLET BY MOUTH EVERY 6 HOURS AS NEEDED (Patient taking  differently: Take 50 mg by mouth every 6 (six) hours as needed. ) 75 tablet 3  . vitamin E 400 UNIT capsule Take 400 Units by mouth daily.    . nitroGLYCERIN (NITROSTAT) 0.4 MG SL tablet Place 1 tablet (0.4 mg total) under the tongue every 5 (five) minutes x 3 doses as needed for chest pain. (Patient not taking: Reported on 07/20/2018) 25 tablet 3   No facility-administered medications prior to visit.     ROS Review of Systems  Constitutional: Positive for fever. Negative for appetite change, chills, diaphoresis, fatigue and unexpected weight change.  HENT: Negative for sore throat.   Respiratory: Positive for cough. Negative for chest tightness, shortness of breath and wheezing.   Cardiovascular: Negative for chest pain, palpitations and leg swelling.  Gastrointestinal: Negative for abdominal pain, constipation, diarrhea, nausea and vomiting.  Endocrine: Positive for polyuria. Negative for polydipsia and polyphagia.  Genitourinary: Positive for frequency. Negative for decreased urine volume, difficulty urinating and urgency.  Musculoskeletal: Negative.  Negative for arthralgias and myalgias.  Skin: Negative for color change and rash.  Neurological: Negative.  Negative for dizziness, weakness and light-headedness.  Hematological: Negative for adenopathy. Does not bruise/bleed easily.  Psychiatric/Behavioral: Negative.     Objective:  BP 132/60 (BP Location:  Left Arm, Patient Position: Sitting, Cuff Size: Large)   Pulse 82   Temp 98.4 F (36.9 C) (Oral)   Ht 6\' 2"  (1.88 m)   Wt 263 lb (119.3 kg)   SpO2 93%   BMI 33.77 kg/m   BP Readings from Last 3 Encounters:  07/20/18 132/60  06/28/18 116/62  06/06/18 120/62    Wt Readings from Last 3 Encounters:  07/20/18 263 lb (119.3 kg)  06/28/18 259 lb (117.5 kg)  06/06/18 257 lb (116.6 kg)    Physical Exam  Constitutional: He is oriented to person, place, and time. No distress.  HENT:  Mouth/Throat: Oropharynx is clear and  moist. No oropharyngeal exudate.  Eyes: Conjunctivae are normal. No scleral icterus.  Neck: Normal range of motion. Neck supple. No JVD present. No thyromegaly present.  Cardiovascular: Normal rate, regular rhythm and normal heart sounds. Exam reveals no gallop.  No murmur heard. Pulmonary/Chest: Effort normal and breath sounds normal. No respiratory distress. He has no wheezes. He has no rales.  Abdominal: Soft. Bowel sounds are normal. He exhibits no mass. There is no hepatosplenomegaly. There is no tenderness.  Musculoskeletal: Normal range of motion. He exhibits no edema, tenderness or deformity.  Lymphadenopathy:    He has no cervical adenopathy.  Neurological: He is alert and oriented to person, place, and time.  Skin: Skin is warm and dry. No rash noted. He is not diaphoretic.  Vitals reviewed.   Lab Results  Component Value Date   WBC 7.6 05/03/2018   HGB 16.4 05/03/2018   HCT 47.5 05/03/2018   PLT 185 05/03/2018   GLUCOSE 193 (H) 06/06/2018   CHOL 100 05/03/2018   TRIG 176 (H) 05/03/2018   HDL 27 (L) 05/03/2018   LDLDIRECT 46.0 10/26/2017   LDLCALC 38 05/03/2018   ALT 27 05/02/2018   AST 20 05/02/2018   NA 136 06/06/2018   K 3.9 06/06/2018   CL 96 06/06/2018   CREATININE 1.43 (H) 06/06/2018   BUN 19 06/06/2018   CO2 25 06/06/2018   TSH 1.41 05/24/2018   PSA 2.76 05/24/2018   INR 0.96 09/10/2014   HGBA1C 6.5 05/24/2018   MICROALBUR 6.3 (H) 05/24/2018    Dg Chest 2 View  Result Date: 05/02/2018 CLINICAL DATA:  Chest pain EXAM: CHEST - 2 VIEW COMPARISON:  10/24/2014 FINDINGS: No focal opacity or pleural effusion. Cardiomediastinal silhouette within normal limits. Aortic atherosclerosis. No pneumothorax. Degenerative changes of the spine. IMPRESSION: No active cardiopulmonary disease. Electronically Signed   By: Donavan Foil M.D.   On: 05/02/2018 16:44   Dg Chest 2 View  Result Date: 07/20/2018 CLINICAL DATA:  Dry cough and fever. EXAM: CHEST - 2 VIEW COMPARISON:   05/02/2018 FINDINGS: Lungs are clear. Heart and mediastinum are within normal limits. Trachea is midline. Atherosclerotic calcifications at the aortic arch. Posterior lung bases are incompletely evaluated on the lateral view. Degenerative changes in the thoracic spine. IMPRESSION: No active cardiopulmonary disease. Electronically Signed   By: Markus Daft M.D.   On: 07/20/2018 20:16      Assessment & Plan:   Edger was seen today for cough.  Diagnoses and all orders for this visit:  Cough- His FeNO score is low so I do not think he has a condition that requires steroid therapy.  His chest x-ray is negative for mass or infiltrate. -     DG Chest 2 View; Future -     POCT EXHALED NITRIC OXIDE  OAB (overactive bladder) -  solifenacin (VESICARE) 10 MG tablet; Take 1 tablet (10 mg total) by mouth daily.  Viral URI with cough- His symptoms, exam, normal chest x-ray are consistent with a viral cause.  Antibiotic therapy is not indicated.  Will offer symptomatic relief with Hycodan as needed. -     HYDROcodone-homatropine (HYCODAN) 5-1.5 MG/5ML syrup; Take 5 mLs by mouth every 8 (eight) hours as needed for up to 7 days for cough.   I am having Valin B. Ewing Schlein start on solifenacin and HYDROcodone-homatropine. I am also having him maintain his aspirin EC, vitamin C, vitamin E, b complex vitamins, Fish Oil, tamsulosin, simvastatin, traMADol, metoprolol tartrate, nitroGLYCERIN, telmisartan, isosorbide mononitrate, amLODipine, chlorthalidone, and tiZANidine.  Meds ordered this encounter  Medications  . solifenacin (VESICARE) 10 MG tablet    Sig: Take 1 tablet (10 mg total) by mouth daily.    Dispense:  90 tablet    Refill:  1  . HYDROcodone-homatropine (HYCODAN) 5-1.5 MG/5ML syrup    Sig: Take 5 mLs by mouth every 8 (eight) hours as needed for up to 7 days for cough.    Dispense:  120 mL    Refill:  0     Follow-up: Return if symptoms worsen or fail to improve.  Scarlette Calico, MD

## 2018-07-26 ENCOUNTER — Encounter: Payer: Self-pay | Admitting: Internal Medicine

## 2018-07-31 ENCOUNTER — Encounter: Payer: Self-pay | Admitting: Cardiology

## 2018-07-31 ENCOUNTER — Ambulatory Visit: Payer: Medicare Other | Admitting: Cardiology

## 2018-07-31 VITALS — BP 118/70 | HR 88 | Ht 74.0 in | Wt 259.0 lb

## 2018-07-31 DIAGNOSIS — I2510 Atherosclerotic heart disease of native coronary artery without angina pectoris: Secondary | ICD-10-CM

## 2018-07-31 DIAGNOSIS — E7800 Pure hypercholesterolemia, unspecified: Secondary | ICD-10-CM

## 2018-07-31 DIAGNOSIS — I10 Essential (primary) hypertension: Secondary | ICD-10-CM | POA: Diagnosis not present

## 2018-07-31 DIAGNOSIS — R0602 Shortness of breath: Secondary | ICD-10-CM | POA: Diagnosis not present

## 2018-07-31 LAB — PRO B NATRIURETIC PEPTIDE: NT-Pro BNP: 69 pg/mL (ref 0–486)

## 2018-07-31 NOTE — Patient Instructions (Signed)
Medication Instructions:  NO CHANGE If you need a refill on your cardiac medications before your next appointment, please call your pharmacy.   Lab work: Your physician recommends that you return for lab work TODAY If you have labs (blood work) drawn today and your tests are completely normal, you will receive your results only by: Marland Kitchen MyChart Message (if you have MyChart) OR . A paper copy in the mail If you have any lab test that is abnormal or we need to change your treatment, we will call you to review the results.  Testing/Procedures:  AMYLOID NUCLEAR STRESS TEST  Follow-Up: Your physician recommends that you schedule a follow-up appointment in: [place]

## 2018-08-01 ENCOUNTER — Encounter: Payer: Self-pay | Admitting: *Deleted

## 2018-08-23 ENCOUNTER — Telehealth (HOSPITAL_COMMUNITY): Payer: Self-pay

## 2018-08-23 NOTE — Telephone Encounter (Signed)
Encounter complete. 

## 2018-08-24 ENCOUNTER — Telehealth (HOSPITAL_COMMUNITY): Payer: Self-pay

## 2018-08-24 NOTE — Telephone Encounter (Signed)
Encounter complete. 

## 2018-08-25 ENCOUNTER — Ambulatory Visit (HOSPITAL_COMMUNITY)
Admission: RE | Admit: 2018-08-25 | Discharge: 2018-08-25 | Disposition: A | Discharge status: Home / Self Care | Payer: Medicare Other | Source: Ambulatory Visit | Attending: Cardiovascular Disease | Admitting: Cardiovascular Disease

## 2018-08-25 DIAGNOSIS — R0602 Shortness of breath: Secondary | ICD-10-CM | POA: Diagnosis present

## 2018-08-25 MED ORDER — TECHNETIUM TC 99M TETROFOSMIN IV KIT
20.4000 | PACK | Freq: Once | INTRAVENOUS | Status: AC | PRN
  Administered 2018-08-25: 20.4 via INTRAVENOUS
  Filled 2018-08-25: qty 21

## 2018-08-29 ENCOUNTER — Encounter: Payer: Self-pay | Admitting: *Deleted

## 2018-08-29 NOTE — Telephone Encounter (Addendum)
Left message for pt to call   ----- Message from Lelon Perla, MD sent at 08/29/2018  7:21 AM EST ----- Nondiagnostic study; will not pursue cardiac MRI given renal insuff Kirk Ruths

## 2018-08-30 ENCOUNTER — Telehealth: Payer: Self-pay | Admitting: Internal Medicine

## 2018-08-30 NOTE — Telephone Encounter (Signed)
RE: change to the telmisartan 80mg  1/2 tab bid? Chlorthalidone is 1 tablet daily.   Tried to call pt back, no answer. Tried to call [facility] and line was busy.   Pharmacy may be filling an old rx and wanted to ask pt about 80 mg of telmisartan and cutting them in half.

## 2018-08-30 NOTE — Telephone Encounter (Signed)
Copied from [place] [id number]. Topic: Quick Communication - See Telephone Encounter >> Aug 30, 2018  9:02 AM Vernona Rieger wrote: CRM for notification. See Telephone encounter for: 08/30/18.  Patient states he needs his telmisartan (MICARDIS) 40 MG tablet is suppose to say takes 2 times daily everyday and since it was not changed when Jodi Mourning changed it , the insurance is giving him a fit because he is out of the medication. He has moved to [place], [place] and [id number] [place] [address] [place] [address]. Please Advise. Also he would like to know is he suppose to take chlorthalidone (HYGROTON) 25 MG tablet one tablet daily or half a tablet. He is taking it once a day but the bottle says take half a tablet. He is out and insurance will not approve this either since the directions are incorrect. He said he prefers to take it once a day.

## 2018-09-06 ENCOUNTER — Encounter: Payer: Self-pay | Admitting: Cardiology

## 2018-09-06 NOTE — Telephone Encounter (Signed)
This encounter was created in error - please disregard.

## 2018-09-06 NOTE — Telephone Encounter (Signed)
New Message    Patient calling for results to [place] study he had done on 08/25/18

## 2018-09-07 NOTE — Progress Notes (Signed)
HPI: FU coronary artery disease. Abdominal ultrasound in August of 2010 showed no aneurysm.  Last cardiac catheterization September 2019 showed 30 to 40% first diagonal, 60 to 70% circumflex, 50% obtuse marginal, 50 to 60% RCA, 50 to 60% PDA and ejection fraction 60%.  Left ventricular end-diastolic pressure 18.  Medical therapy recommended.  Echocardiogram September 2019 showed normal LV function, moderate diastolic dysfunction and moderate left ventricular hypertrophy.  PYP scan January 2020 equivocal.   Patient was admitted to [facility] January 28 with atrial flutter.  He had TEE guided cardioversion followed by ablation.  Thyroid functions were normal as was troponin.  Transthoracic echocardiogram showed normal LV function but was technically difficult.  TEE showed mild LV dysfunction.  Since last seen,  he feels much improved.  His symptoms at time of atrial flutter or significant dyspnea but no chest pain or palpitations.  He has not had syncope.  No bleeding.  Current Outpatient Medications  Medication Sig Dispense Refill  . amLODipine (NORVASC) 5 MG tablet TAKE 1 TABLET BY MOUTH EVERY DAY 90 tablet 1  . apixaban (ELIQUIS) 5 MG TABS tablet Take 5 mg by mouth 2 (two) times daily.    . Ascorbic Acid (VITAMIN C) 1000 MG tablet Take 1,000 mg by mouth daily.    Marland Kitchen aspirin EC 81 MG tablet Take 81 mg by mouth daily.    Marland Kitchen atorvastatin (LIPITOR) 80 MG tablet Take 80 mg by mouth daily.    Marland Kitchen b complex vitamins tablet Take 1 tablet by mouth daily.    . chlorthalidone (HYGROTON) 25 MG tablet Take 1 tablet (25 mg total) by mouth daily. 45 tablet 3  . isosorbide mononitrate (IMDUR) 30 MG 24 hr tablet Take 1 tablet (30 mg total) by mouth daily. 30 tablet 3  . metoprolol tartrate (LOPRESSOR) 50 MG tablet Take 1 tablet (50 mg total) by mouth 2 (two) times daily. 180 tablet 3  . nitroGLYCERIN (NITROSTAT) 0.4 MG SL tablet Place 1 tablet (0.4 mg total) under the tongue every 5 (five) minutes x 3 doses as needed  for chest pain. 25 tablet 3  . Omega-3 Fatty Acids (FISH OIL) 1000 MG CAPS Take 1,000 mg by mouth daily.    Marland Kitchen telmisartan (MICARDIS) 80 MG tablet Take 1 tablet (80 mg total) by mouth daily. 45 tablet 0  . tiZANidine (ZANAFLEX) 4 MG tablet Take 1 tablet (4 mg total) by mouth at bedtime. 30 tablet 1  . traMADol (ULTRAM) 50 MG tablet TAKE 1 TABLET BY MOUTH EVERY 6 HOURS AS NEEDED (Patient taking differently: Take 50 mg by mouth every 6 (six) hours as needed. ) 75 tablet 3  . vitamin E 400 UNIT capsule Take 400 Units by mouth daily.     No current facility-administered medications for this visit.      Past Medical History:  Diagnosis Date  . Arthritis   . CAD (coronary artery disease) 05/03/2018   moderate CAD, normal LVF- medical rx  . Frequent urination   . GERD (gastroesophageal reflux disease)   . Hyperlipidemia   . Hypertension 05/03/2018   normal LVF, moderate LVH, grade 2 DD  . Hypertrophy of prostate with urinary obstruction and other lower urinary tract symptoms (LUTS)   . Low back pain   . Low HDL (under 40)   . Shortness of breath dyspnea    with ambulation  . Skin cancer    removed from face    Past Surgical History:  Procedure Laterality Date  .  ANTERIOR LAT LUMBAR FUSION  04/07/2012   Procedure: ANTERIOR LATERAL LUMBAR FUSION 2 LEVELS;  Surgeon: Eustace Moore, MD;  Location: [facility];  Service: Neurosurgery;  Laterality: Right;  Lumbar three-four and four-five extreme lumbar interbody fusion  . BACK SURGERY    . CARDIAC CATHETERIZATION    . COLONOSCOPY W/ POLYPECTOMY    . EYE SURGERY     tightened muscles in right eye  . LEFT HEART CATH AND CORONARY ANGIOGRAPHY N/A 05/03/2018   Procedure: LEFT HEART CATH AND CORONARY ANGIOGRAPHY;  Surgeon: Belva Crome, MD;  Location: [facility];  Service: Cardiovascular;  Laterality: N/A;  . TONSILLECTOMY      Social History   Socioeconomic History  . Marital status: Married    Spouse name: Not on file  . Number  of children: 4  . Years of education: Not on file  . Highest education level: Not on file  Occupational History  . Not on file  Social Needs  . Financial resource strain: Not on file  . Food insecurity:    Worry: Not on file    Inability: Not on file  . Transportation needs:    Medical: Not on file    Non-medical: Not on file  Tobacco Use  . Smoking status: Current Some Day Smoker    Types: Cigarettes    Last attempt to quit: 08/16/2006    Years since quitting: 12.0  . Smokeless tobacco: Never Used  Substance and Sexual Activity  . Alcohol use: Yes    Comment: Rare  . Drug use: No  . Sexual activity: Yes  Lifestyle  . Physical activity:    Days per week: Not on file    Minutes per session: Not on file  . Stress: Not on file  Relationships  . Social connections:    Talks on phone: Not on file    Gets together: Not on file    Attends religious service: Not on file    Active member of club or organization: Not on file    Attends meetings of clubs or organizations: Not on file    Relationship status: Not on file  . Intimate partner violence:    Fear of current or ex partner: Not on file    Emotionally abused: Not on file    Physically abused: Not on file    Forced sexual activity: Not on file  Other Topics Concern  . Not on file  Social History Narrative  . Not on file    Family History  Problem Relation Age of Onset  . Alcohol abuse Other   . Drug abuse Other   . Arthritis Other   . Hypertension Other     ROS: no fevers or chills, productive cough, hemoptysis, dysphasia, odynophagia, melena, hematochezia, dysuria, hematuria, rash, seizure activity, orthopnea, PND, pedal edema, claudication. Remaining systems are negative.  Physical Exam: Well-developed well-nourished in no acute distress.  Skin is warm and dry.  HEENT is normal.  Neck is supple.  Chest is clear to auscultation with normal expansion.  Cardiovascular exam is regular rate and rhythm.    Abdominal exam nontender or distended. No masses palpated. Extremities show no edema. neuro grossly intact  ECG-sinus rhythm at a rate of 78.  Occasional PAC.  No significant ST changes.  Personally reviewed  A/P  1 coronary artery disease-last catheterization revealed moderate coronary disease.  Plan to continue medical therapy with aspirin and statin.  2 hypertension-patient's blood pressure is controlled.  Continue present medications and follow.  3 hyperlipidemia-continue statin. Lipitor recently started; check lipids and liver in 3 months.  4 chronic stage III kidney disease-monitored by nephrology.  5 atrial flutter-status post recent ablation.  Continue apixaban for 4 weeks following cardioversion/ablation and then discontinue.  6 dyspnea-improved. Previous chest x-ray showed no acute cardiopulmonary disease.  Previous left ventricular end-diastolic pressure normal.  Previous proBNP normal.  PYP scan equivocal.  I have not pursued MRI because of renal insufficiency.  Kirk Ruths, MD

## 2018-09-13 ENCOUNTER — Ambulatory Visit: Payer: Medicare Other | Admitting: Family

## 2018-09-13 MED ORDER — VALSARTAN 40 MG PO TABS
40.00 | ORAL_TABLET | ORAL | Status: DC
Start: 2018-09-13 — End: 2018-09-13

## 2018-09-13 MED ORDER — ISOSORBIDE MONONITRATE ER 30 MG PO TB24
30.00 | ORAL_TABLET | ORAL | Status: DC
Start: 2018-09-14 — End: 2018-09-13

## 2018-09-13 MED ORDER — METOPROLOL TARTRATE 50 MG PO TABS
50.00 | ORAL_TABLET | ORAL | Status: DC
Start: ? — End: 2018-09-13

## 2018-09-13 MED ORDER — ATORVASTATIN CALCIUM 40 MG PO TABS
40.00 | ORAL_TABLET | ORAL | Status: DC
Start: 2018-09-13 — End: 2018-09-13

## 2018-09-13 MED ORDER — LIDOCAINE 5 % EX PTCH
1.00 | MEDICATED_PATCH | CUTANEOUS | Status: DC
Start: 2018-09-14 — End: 2018-09-13

## 2018-09-13 MED ORDER — ASPIRIN 81 MG PO CHEW
81.00 | CHEWABLE_TABLET | ORAL | Status: DC
Start: 2018-09-14 — End: 2018-09-13

## 2018-09-13 MED ORDER — ACETAMINOPHEN 325 MG PO TABS
650.00 | ORAL_TABLET | ORAL | Status: DC
Start: ? — End: 2018-09-13

## 2018-09-13 MED ORDER — APIXABAN 5 MG PO TABS
5.00 | ORAL_TABLET | ORAL | Status: DC
Start: 2018-09-13 — End: 2018-09-13

## 2018-09-13 MED ORDER — AMLODIPINE BESYLATE 5 MG PO TABS
5.00 | ORAL_TABLET | ORAL | Status: DC
Start: 2018-09-14 — End: 2018-09-13

## 2018-09-13 NOTE — Telephone Encounter (Signed)
Pt was seen in Cardiac ICU.   PCP will see pt and follow up/change medication as needed.

## 2018-09-14 ENCOUNTER — Telehealth: Payer: Self-pay | Admitting: Internal Medicine

## 2018-09-14 DIAGNOSIS — I10 Essential (primary) hypertension: Secondary | ICD-10-CM

## 2018-09-14 DIAGNOSIS — I2510 Atherosclerotic heart disease of native coronary artery without angina pectoris: Secondary | ICD-10-CM

## 2018-09-15 MED ORDER — TELMISARTAN 80 MG PO TABS: 80.0000 mg | ORAL_TABLET | Freq: Every day | ORAL | 0 refills | Status: DC

## 2018-09-15 NOTE — Telephone Encounter (Signed)
Patient calling and states that he is having a time with insurance covering this medication. States that they will cover the 80mg  tablet, but not the 40mg  tablet 2x a day. Please advise. Would like to know if he can get the 80mg  tablet and cut the pill in half when he goes to take the medication? [id number] [place], [place]

## 2018-09-15 NOTE — Telephone Encounter (Signed)
Called pt and left vm.   PCP is okay with rx for telmisartan 80 mg if pt is okay with cutting this medication in half.   The rx is pended if pt agrees.

## 2018-09-15 NOTE — Telephone Encounter (Signed)
Previous note started about the is topic. Telephone encounter from 08/30/2018.   Closing this note.

## 2018-09-15 NOTE — Addendum Note (Signed)
Addended by: Terence Lux B on: 09/15/2018 04:07 PM   Modules accepted: Orders

## 2018-09-18 ENCOUNTER — Encounter: Payer: Self-pay | Admitting: Cardiology

## 2018-09-18 ENCOUNTER — Ambulatory Visit: Payer: Medicare Other | Admitting: Cardiology

## 2018-09-18 VITALS — BP 132/58 | HR 78 | Ht 74.0 in | Wt 263.0 lb

## 2018-09-18 DIAGNOSIS — E7800 Pure hypercholesterolemia, unspecified: Secondary | ICD-10-CM

## 2018-09-18 DIAGNOSIS — I2510 Atherosclerotic heart disease of native coronary artery without angina pectoris: Secondary | ICD-10-CM | POA: Diagnosis not present

## 2018-09-18 DIAGNOSIS — I4892 Unspecified atrial flutter: Secondary | ICD-10-CM

## 2018-09-18 NOTE — Patient Instructions (Addendum)
Medication Instructions:  STOP ELIQUIS WHEN CURRENT BOTTLE COMPLETE If you need a refill on your cardiac medications before your next appointment, please call your pharmacy.   Lab work: Your physician recommends that you return for lab work in: [place] If you have labs (blood work) drawn today and your tests are completely normal, you will receive your results only by: Marland Kitchen MyChart Message (if you have MyChart) OR . A paper copy in the mail If you have any lab test that is abnormal or we need to change your treatment, we will call you to review the results.  Follow-Up: At [facility], you and your health needs are our priority.  As part of our continuing mission to provide you with exceptional heart care, we have created designated Provider Care Teams.  These Care Teams include your primary Cardiologist (physician) and Advanced Practice Providers (APPs -  Physician Assistants and Nurse Practitioners) who all work together to provide you with the care you need, when you need it. Your physician recommends that you schedule a follow-up appointment in: [place]

## 2018-10-05 ENCOUNTER — Other Ambulatory Visit: Payer: Self-pay | Admitting: Cardiology

## 2018-10-12 ENCOUNTER — Other Ambulatory Visit: Payer: Self-pay | Admitting: Internal Medicine

## 2018-10-12 DIAGNOSIS — E785 Hyperlipidemia, unspecified: Secondary | ICD-10-CM

## 2018-10-12 DIAGNOSIS — I2510 Atherosclerotic heart disease of native coronary artery without angina pectoris: Secondary | ICD-10-CM

## 2018-10-12 DIAGNOSIS — I700 Atherosclerosis of aorta: Secondary | ICD-10-CM

## 2018-10-25 ENCOUNTER — Other Ambulatory Visit: Payer: Self-pay | Admitting: Family

## 2018-10-28 ENCOUNTER — Other Ambulatory Visit: Payer: Self-pay | Admitting: Internal Medicine

## 2018-11-24 ENCOUNTER — Other Ambulatory Visit: Payer: Self-pay | Admitting: Internal Medicine

## 2018-11-24 DIAGNOSIS — M159 Polyosteoarthritis, unspecified: Secondary | ICD-10-CM

## 2018-11-24 DIAGNOSIS — M150 Primary generalized (osteo)arthritis: Principal | ICD-10-CM

## 2018-11-24 DIAGNOSIS — M25552 Pain in left hip: Secondary | ICD-10-CM

## 2018-11-24 DIAGNOSIS — M25551 Pain in right hip: Secondary | ICD-10-CM

## 2018-11-26 ENCOUNTER — Other Ambulatory Visit: Payer: Self-pay | Admitting: Internal Medicine

## 2018-11-26 DIAGNOSIS — M159 Polyosteoarthritis, unspecified: Secondary | ICD-10-CM

## 2018-11-26 DIAGNOSIS — M25551 Pain in right hip: Secondary | ICD-10-CM

## 2018-11-26 DIAGNOSIS — M150 Primary generalized (osteo)arthritis: Principal | ICD-10-CM

## 2018-11-26 DIAGNOSIS — M25552 Pain in left hip: Secondary | ICD-10-CM

## 2018-11-26 MED ORDER — TRAMADOL HCL 50 MG PO TABS: 50.0000 mg | ORAL_TABLET | Freq: Four times a day (QID) | ORAL | 3 refills | Status: DC | PRN

## 2018-12-14 ENCOUNTER — Telehealth: Payer: Self-pay

## 2018-12-14 NOTE — Progress Notes (Deleted)
Virtual Visit via Video Note   This visit type was conducted due to national recommendations for restrictions regarding the COVID-19 Pandemic (e.g. social distancing) in an effort to limit this patient's exposure and mitigate transmission in our community.  Due to his co-morbid illnesses, this patient is at least at moderate risk for complications without adequate follow up.  This format is felt to be most appropriate for this patient at this time.  All issues noted in this document were discussed and addressed.  A limited physical exam was performed with this format.  Please refer to the patient's chart for his consent to telehealth for [facility].   Evaluation Performed:  Follow-up visit  Date:  12/14/2018   ID:  Jack Farmer, Jack Farmer Jun 28, 1941, MRN [id number]  Patient Location: Home Provider Location: Home  PCP:  Janith Lima, MD  Cardiologist:  Kirk Ruths, MD   Chief Complaint:  FU CAD  History of Present Illness:    FUcoronary artery disease. Abdominal ultrasound in August of 2010 showed no aneurysm. Last cardiac catheterization September 2019 showed 30 to 40% first diagonal, 60 to 70% circumflex, 50% obtuse marginal, 50 to 60% RCA, 50 to 60% PDA and ejection fraction 60%. Left ventricular end-diastolic pressure 18. Medical therapy recommended. Echocardiogram September 2019 showed normal LV function, moderate diastolic dysfunction and moderate left ventricular hypertrophy.  PYP scan January 2020 equivocal.  Patient was admitted to [facility] January 28 with atrial flutter.  He had TEE guided cardioversion followed by ablation.  Thyroid functions were normal as was troponin.  Transthoracic echocardiogram showed normal LV function but was technically difficult.  TEE showed mild LV dysfunction.  Since last seen,  The patient does not have symptoms concerning for COVID-19 infection (fever, chills, cough, or new shortness of breath).    Past Medical History:  Diagnosis  Date  . Arthritis   . CAD (coronary artery disease) 05/03/2018   moderate CAD, normal LVF- medical rx  . Frequent urination   . GERD (gastroesophageal reflux disease)   . Hyperlipidemia   . Hypertension 05/03/2018   normal LVF, moderate LVH, grade 2 DD  . Hypertrophy of prostate with urinary obstruction and other lower urinary tract symptoms (LUTS)   . Low back pain   . Low HDL (under 40)   . Shortness of breath dyspnea    with ambulation  . Skin cancer    removed from face   Past Surgical History:  Procedure Laterality Date  . ANTERIOR LAT LUMBAR FUSION  04/07/2012   Procedure: ANTERIOR LATERAL LUMBAR FUSION 2 LEVELS;  Surgeon: Eustace Moore, MD;  Location: [place] [facility];  Service: Neurosurgery;  Laterality: Right;  Lumbar three-four and four-five extreme lumbar interbody fusion  . BACK SURGERY    . CARDIAC CATHETERIZATION    . COLONOSCOPY W/ POLYPECTOMY    . EYE SURGERY     tightened muscles in right eye  . LEFT HEART CATH AND CORONARY ANGIOGRAPHY N/A 05/03/2018   Procedure: LEFT HEART CATH AND CORONARY ANGIOGRAPHY;  Surgeon: Belva Crome, MD;  Location: [place] [facility];  Service: Cardiovascular;  Laterality: N/A;  . TONSILLECTOMY       No outpatient medications have been marked as taking for the 12/18/18 encounter (Appointment) with Lelon Perla, MD.     Allergies:   Cephalexin and Indocin [indomethacin]   Social History   Tobacco Use  . Smoking status: Current Some Day Smoker    Types: Cigarettes    Last  attempt to quit: 08/16/2006    Years since quitting: 12.3  . Smokeless tobacco: Never Used  Substance Use Topics  . Alcohol use: Yes    Comment: Rare  . Drug use: No     Family Hx: The patient's family history includes Alcohol abuse in an other family member; Arthritis in an other family member; Drug abuse in an other family member; Hypertension in an other family member.  ROS:   Please see the history of present illness.    No fevers, chills or  productive cough. All other systems reviewed and are negative.  Recent Labs: 05/02/2018: ALT 27 05/03/2018: Hemoglobin 16.4; Platelets 185 05/24/2018: TSH 1.41 06/06/2018: BUN 19; Creatinine, Ser 1.43; Potassium 3.9; Sodium 136 07/31/2018: NT-Pro BNP 69   Recent Lipid Panel Lab Results  Component Value Date/Time   CHOL 100 05/03/2018 02:35 AM   TRIG 176 (H) 05/03/2018 02:35 AM   HDL 27 (L) 05/03/2018 02:35 AM   CHOLHDL 3.7 05/03/2018 02:35 AM   LDLCALC 38 05/03/2018 02:35 AM   LDLDIRECT 46.0 10/26/2017 03:32 PM    Wt Readings from Last 3 Encounters:  09/18/18 263 lb (119.3 kg)  08/25/18 250 lb (113.4 kg)  07/31/18 259 lb (117.5 kg)     Objective:    Vital Signs:  There were no vitals taken for this visit.   VITAL SIGNS:  reviewed  Normal affect. Answers questions appropriately. No acute distress Remainder of physical examination not performed (telehealth visit; coronavirus pandemic)  ASSESSMENT & PLAN:    1. Coronary artery disease-patient denies chest pain.  Plan to continue medical therapy with aspirin and statin. 2. Hypertension-patient's blood pressure has been controlled at home.  Continue present medications. 3. Hyperlipidemia-continue statin. 4. History of atrial flutter-status post ablation. 5. Chronic stage III kidney disease-followed by nephrology. 6. Dyspnea-as outlined in previous notes his symptoms improved.  Previous chest x-ray negative.  Previous left ventricular end-diastolic blood pressure normal.  PYP scan equivocal.  We elected not to pursue MRI because of renal insufficiency.  COVID-19 Education: The signs and symptoms of COVID-19 were discussed with the patient and how to seek care for testing (follow up with PCP or arrange E-visit).  ***The importance of social distancing was discussed today.  Time:   Today, I have spent *** minutes with the patient with telehealth technology discussing the above problems.     Medication Adjustments/Labs and  Tests Ordered: Current medicines are reviewed at length with the patient today.  Concerns regarding medicines are outlined above.   Tests Ordered: No orders of the defined types were placed in this encounter.   Medication Changes: No orders of the defined types were placed in this encounter.   Disposition:  Follow up {follow up:15908}  Signed, Kirk Ruths, MD  12/14/2018 3:51 PM    [facility]

## 2018-12-14 NOTE — Telephone Encounter (Signed)
Left a detailed voice message for the patient about switching his upcoming appointment to a video virtual visit and to call the office back and someone will be glad to assist with switching the appointment

## 2018-12-15 ENCOUNTER — Telehealth: Payer: Self-pay | Admitting: *Deleted

## 2018-12-15 NOTE — Telephone Encounter (Signed)
Attempted to call patient voice mail box is full

## 2018-12-15 NOTE — Telephone Encounter (Signed)
Left message for patient to call regarding 12/18/18 appointment with Dr. Madelaine Etienne or telephone?

## 2018-12-18 ENCOUNTER — Ambulatory Visit: Payer: Medicare Other | Admitting: Cardiology

## 2018-12-18 ENCOUNTER — Telehealth: Payer: Self-pay

## 2018-12-18 ENCOUNTER — Other Ambulatory Visit: Payer: Self-pay

## 2018-12-18 DIAGNOSIS — I10 Essential (primary) hypertension: Secondary | ICD-10-CM

## 2018-12-18 MED ORDER — CHLORTHALIDONE 25 MG PO TABS: 25.0000 mg | ORAL_TABLET | Freq: Every day | ORAL | 3 refills | Status: DC

## 2018-12-18 NOTE — Telephone Encounter (Signed)
Called patient to change office visit to virtual visit due to covid 19. Mailbox was full and I could not leave a message

## 2019-05-11 ENCOUNTER — Other Ambulatory Visit: Payer: Self-pay

## 2019-05-11 MED ORDER — METOPROLOL TARTRATE 50 MG PO TABS: 50.0000 mg | ORAL_TABLET | Freq: Two times a day (BID) | ORAL | 3 refills | Status: DC

## 2019-11-12 DIAGNOSIS — E1165 Type 2 diabetes mellitus with hyperglycemia: Secondary | ICD-10-CM | POA: Diagnosis not present

## 2019-11-12 DIAGNOSIS — E785 Hyperlipidemia, unspecified: Secondary | ICD-10-CM | POA: Diagnosis not present

## 2019-11-12 DIAGNOSIS — Z79899 Other long term (current) drug therapy: Secondary | ICD-10-CM | POA: Diagnosis not present

## 2019-11-12 DIAGNOSIS — I10 Essential (primary) hypertension: Secondary | ICD-10-CM | POA: Diagnosis not present

## 2019-11-12 DIAGNOSIS — N183 Chronic kidney disease, stage 3 unspecified: Secondary | ICD-10-CM | POA: Diagnosis not present

## 2019-11-12 DIAGNOSIS — I4892 Unspecified atrial flutter: Secondary | ICD-10-CM | POA: Diagnosis not present

## 2019-11-12 DIAGNOSIS — E119 Type 2 diabetes mellitus without complications: Secondary | ICD-10-CM | POA: Diagnosis not present

## 2019-11-22 DIAGNOSIS — I4892 Unspecified atrial flutter: Secondary | ICD-10-CM | POA: Diagnosis not present

## 2019-11-22 DIAGNOSIS — Z7901 Long term (current) use of anticoagulants: Secondary | ICD-10-CM | POA: Diagnosis not present

## 2019-11-22 DIAGNOSIS — I5032 Chronic diastolic (congestive) heart failure: Secondary | ICD-10-CM | POA: Diagnosis not present

## 2019-11-22 DIAGNOSIS — Z7982 Long term (current) use of aspirin: Secondary | ICD-10-CM | POA: Diagnosis not present

## 2019-11-22 DIAGNOSIS — I4891 Unspecified atrial fibrillation: Secondary | ICD-10-CM | POA: Diagnosis not present

## 2019-11-22 DIAGNOSIS — I110 Hypertensive heart disease with heart failure: Secondary | ICD-10-CM | POA: Diagnosis not present

## 2019-11-22 DIAGNOSIS — Z87891 Personal history of nicotine dependence: Secondary | ICD-10-CM | POA: Diagnosis not present

## 2019-11-22 DIAGNOSIS — Z79899 Other long term (current) drug therapy: Secondary | ICD-10-CM | POA: Diagnosis not present

## 2019-11-22 DIAGNOSIS — E785 Hyperlipidemia, unspecified: Secondary | ICD-10-CM | POA: Diagnosis not present

## 2019-11-22 DIAGNOSIS — M48061 Spinal stenosis, lumbar region without neurogenic claudication: Secondary | ICD-10-CM | POA: Diagnosis not present

## 2019-11-22 DIAGNOSIS — I480 Paroxysmal atrial fibrillation: Secondary | ICD-10-CM | POA: Diagnosis not present

## 2019-12-11 DIAGNOSIS — E1165 Type 2 diabetes mellitus with hyperglycemia: Secondary | ICD-10-CM | POA: Diagnosis not present

## 2019-12-24 DIAGNOSIS — E1165 Type 2 diabetes mellitus with hyperglycemia: Secondary | ICD-10-CM | POA: Diagnosis not present

## 2019-12-24 DIAGNOSIS — I25118 Atherosclerotic heart disease of native coronary artery with other forms of angina pectoris: Secondary | ICD-10-CM | POA: Diagnosis not present

## 2019-12-24 DIAGNOSIS — I4892 Unspecified atrial flutter: Secondary | ICD-10-CM | POA: Diagnosis not present

## 2019-12-24 DIAGNOSIS — Z79899 Other long term (current) drug therapy: Secondary | ICD-10-CM | POA: Diagnosis not present

## 2019-12-24 DIAGNOSIS — I10 Essential (primary) hypertension: Secondary | ICD-10-CM | POA: Diagnosis not present

## 2020-01-10 DIAGNOSIS — E1165 Type 2 diabetes mellitus with hyperglycemia: Secondary | ICD-10-CM | POA: Diagnosis not present

## 2020-01-10 DIAGNOSIS — R5383 Other fatigue: Secondary | ICD-10-CM | POA: Diagnosis not present

## 2020-01-10 DIAGNOSIS — N183 Chronic kidney disease, stage 3 unspecified: Secondary | ICD-10-CM | POA: Diagnosis not present

## 2020-01-10 DIAGNOSIS — I4892 Unspecified atrial flutter: Secondary | ICD-10-CM | POA: Diagnosis not present

## 2020-01-16 DIAGNOSIS — I480 Paroxysmal atrial fibrillation: Secondary | ICD-10-CM | POA: Diagnosis not present

## 2020-01-16 DIAGNOSIS — I2510 Atherosclerotic heart disease of native coronary artery without angina pectoris: Secondary | ICD-10-CM | POA: Diagnosis not present

## 2020-01-28 DIAGNOSIS — N183 Chronic kidney disease, stage 3 unspecified: Secondary | ICD-10-CM | POA: Diagnosis not present

## 2020-01-28 DIAGNOSIS — E1165 Type 2 diabetes mellitus with hyperglycemia: Secondary | ICD-10-CM | POA: Diagnosis not present

## 2020-01-28 DIAGNOSIS — I4892 Unspecified atrial flutter: Secondary | ICD-10-CM | POA: Diagnosis not present

## 2020-01-28 DIAGNOSIS — R5383 Other fatigue: Secondary | ICD-10-CM | POA: Diagnosis not present

## 2020-01-28 DIAGNOSIS — D649 Anemia, unspecified: Secondary | ICD-10-CM | POA: Diagnosis not present

## 2020-03-14 ENCOUNTER — Other Ambulatory Visit: Payer: Self-pay

## 2020-03-14 ENCOUNTER — Ambulatory Visit (INDEPENDENT_AMBULATORY_CARE_PROVIDER_SITE_OTHER): Payer: Medicare Other | Admitting: Cardiology

## 2020-03-14 ENCOUNTER — Encounter: Payer: Self-pay | Admitting: Cardiology

## 2020-03-14 VITALS — BP 120/60 | HR 100 | Ht 74.0 in | Wt 260.0 lb

## 2020-03-14 DIAGNOSIS — Z7901 Long term (current) use of anticoagulants: Secondary | ICD-10-CM

## 2020-03-14 DIAGNOSIS — I119 Hypertensive heart disease without heart failure: Secondary | ICD-10-CM

## 2020-03-14 DIAGNOSIS — I951 Orthostatic hypotension: Secondary | ICD-10-CM

## 2020-03-14 DIAGNOSIS — I480 Paroxysmal atrial fibrillation: Secondary | ICD-10-CM

## 2020-03-14 DIAGNOSIS — N183 Chronic kidney disease, stage 3 unspecified: Secondary | ICD-10-CM | POA: Diagnosis not present

## 2020-03-14 DIAGNOSIS — I4819 Other persistent atrial fibrillation: Secondary | ICD-10-CM

## 2020-03-14 DIAGNOSIS — E785 Hyperlipidemia, unspecified: Secondary | ICD-10-CM

## 2020-03-14 DIAGNOSIS — I2510 Atherosclerotic heart disease of native coronary artery without angina pectoris: Secondary | ICD-10-CM

## 2020-03-14 HISTORY — DX: Orthostatic hypotension: I95.1

## 2020-03-14 HISTORY — DX: Paroxysmal atrial fibrillation: I48.0

## 2020-03-14 HISTORY — DX: Other persistent atrial fibrillation: I48.19

## 2020-03-14 HISTORY — DX: Long term (current) use of anticoagulants: Z79.01

## 2020-03-14 LAB — CBC
Hematocrit: 40.6 % (ref 37.5–51.0)
Hemoglobin: 12.6 g/dL — ABNORMAL LOW (ref 13.0–17.7)
MCH: 26.6 pg (ref 26.6–33.0)
MCHC: 31 g/dL — ABNORMAL LOW (ref 31.5–35.7)
MCV: 86 fL (ref 79–97)
Platelets: 237 10*3/uL (ref 150–450)
RBC: 4.74 x10E6/uL (ref 4.14–5.80)
RDW: 18 % — ABNORMAL HIGH (ref 11.6–15.4)
WBC: 6.1 10*3/uL (ref 3.4–10.8)

## 2020-03-14 LAB — BASIC METABOLIC PANEL
BUN/Creatinine Ratio: 14 (ref 10–24)
BUN: 25 mg/dL (ref 8–27)
CO2: 28 mmol/L (ref 20–29)
Calcium: 10.3 mg/dL — ABNORMAL HIGH (ref 8.6–10.2)
Chloride: 99 mmol/L (ref 96–106)
Creatinine, Ser: 1.79 mg/dL — ABNORMAL HIGH (ref 0.76–1.27)
GFR calc Af Amer: 41 mL/min/{1.73_m2} — ABNORMAL LOW (ref >59)
GFR calc non Af Amer: 36 mL/min/{1.73_m2} — ABNORMAL LOW (ref >59)
Glucose: 288 mg/dL — ABNORMAL HIGH (ref 65–99)
Potassium: 4 mmol/L (ref 3.5–5.2)
Sodium: 139 mmol/L (ref 134–144)

## 2020-03-14 MED ORDER — PANTOPRAZOLE SODIUM 40 MG PO TBEC: 40.0000 mg | DELAYED_RELEASE_TABLET | Freq: Every day | ORAL | 11 refills | Status: DC

## 2020-03-14 NOTE — Assessment & Plan Note (Signed)
Atrial flutter Jan 2020- admitted to [facility].  Rx'd with DCCV and RFA. He was followed at [facility] until recently when he moved back to [place].  They changed him from Eliquis to Xarelto 15 mg, not clear why

## 2020-03-14 NOTE — Progress Notes (Signed)
Cardiology Office Note:    Date:  03/14/2020   ID:  Barnett Elzey, DOB [birth year], MRN [id number]  PCP:  Janith Lima, MD  Cardiologist:  Kirk Ruths, MD  Electrophysiologist:  None   Referring MD: Janith Lima, MD   CC:  Orthostatic symptoms  History of Present Illness:    Jack Farmer is a 79 y.o. male who played college football at [employer]. He has a hx of coronary disease documented by catheterization September 2019.  He has moderate diffuse coronary disease and has been treated medically.  He has a history of PAF and in January 2020 underwent cardioversion and radiofrequency ablation at [facility].  He was placed on Eliquis after this.  He was seen last by Dr. Stanford Breed in February 2020.  He then moved to [facility] to be closer to his daughter.  He was followed at the by the [facility] EP clinic.  At some point he was changed from Eliquis to Xarelto 15 mg.  He is actually been stable from his atrial fibrillation standpoint.  Other medical issues include non-insulin-dependent diabetes, chronic renal insufficiency stage III, hypertension, and dyslipidemia.  He has now moved back to the area in about 4 weeks ago contacted [place] to be reestablished.  He is also had increasing shortness of breath and tachycardia with exertion.  For the last couple weeks he has had dizziness when he stands up as well.  He did say he had black stools a couple weeks ago and his primary care doctor put him on iron.  Those labs are currently unavailable to me.  His amlodipine was stopped by [facility] EP.  He has not been back in atrial fibrillation as far as we can tell.  Today in the office he is significantly orthostatic with a blood pressure of 140/62 lying, 120/62 still sitting, 98/58 standing.  He is also tachycardic with a heart rate of 105.  A few weeks ago he was taken off Glucophage because of nausea.  He is not previously had ulcers, he does say he has had endoscopies and colonoscopies in the  past.  Past Medical History:  Diagnosis Date   Arthritis    CAD (coronary artery disease) 05/03/2018   moderate CAD, normal LVF- medical rx   Frequent urination    GERD (gastroesophageal reflux disease)    Hyperlipidemia    Hypertension 05/03/2018   normal LVF, moderate LVH, grade 2 DD   Hypertrophy of prostate with urinary obstruction and other lower urinary tract symptoms (LUTS)    Low back pain    Low HDL (under 40)    Shortness of breath dyspnea    with ambulation   Skin cancer    removed from face    Past Surgical History:  Procedure Laterality Date   ANTERIOR LAT LUMBAR FUSION  04/07/2012   Procedure: ANTERIOR LATERAL LUMBAR FUSION 2 LEVELS;  Surgeon: Eustace Moore, MD;  Location: [facility];  Service: Neurosurgery;  Laterality: Right;  Lumbar three-four and four-five extreme lumbar interbody fusion   BACK SURGERY     CARDIAC CATHETERIZATION     COLONOSCOPY W/ POLYPECTOMY     EYE SURGERY     tightened muscles in right eye   LEFT HEART CATH AND CORONARY ANGIOGRAPHY N/A 05/03/2018   Procedure: LEFT HEART CATH AND CORONARY ANGIOGRAPHY;  Surgeon: Belva Crome, MD;  Location: [place] [facility];  Service: Cardiovascular;  Laterality: N/A;   TONSILLECTOMY  Current Medications: Current Meds  Medication Sig   Ascorbic Acid (VITAMIN C) 1000 MG tablet Take 1,000 mg by mouth daily.   aspirin EC 81 MG tablet Take 81 mg by mouth daily.   atorvastatin (LIPITOR) 80 MG tablet Take 80 mg by mouth daily.   chlorthalidone (HYGROTON) 25 MG tablet Take 1 tablet (25 mg total) by mouth daily.   empagliflozin (JARDIANCE) 10 MG TABS tablet Take 10 mg by mouth daily.   isosorbide mononitrate (IMDUR) 30 MG 24 hr tablet TAKE 1 TABLET BY MOUTH EVERY DAY   metoprolol tartrate (LOPRESSOR) 50 MG tablet Take 1 tablet (50 mg total) by mouth 2 (two) times daily.   nitroGLYCERIN (NITROSTAT) 0.4 MG SL tablet Place 1 tablet (0.4 mg total) under the tongue every 5 (five)  minutes x 3 doses as needed for chest pain.   Rivaroxaban (XARELTO) 15 MG TABS tablet Take 15 mg by mouth 2 (two) times daily with a meal.   telmisartan (MICARDIS) 80 MG tablet TAKE 1 TABLET BY MOUTH EVERY DAY   tiZANidine (ZANAFLEX) 4 MG tablet TAKE 1 TABLET BY MOUTH AT BEDTIME   traMADol (ULTRAM) 50 MG tablet Take 1 tablet (50 mg total) by mouth every 6 (six) hours as needed.   vitamin E 400 UNIT capsule Take 400 Units by mouth daily.     Allergies:   Cephalexin and Indocin [indomethacin]   Social History   Socioeconomic History   Marital status: Married    Spouse name: Not on file   Number of children: 4   Years of education: Not on file   Highest education level: Not on file  Occupational History   Not on file  Tobacco Use   Smoking status: Current Some Day Smoker    Types: Cigarettes    Last attempt to quit: 08/16/2006    Years since quitting: 13.5   Smokeless tobacco: Never Used  Substance and Sexual Activity   Alcohol use: Yes    Comment: Rare   Drug use: No   Sexual activity: Yes  Other Topics Concern   Not on file  Social History Narrative   Not on file   Social Determinants of Health   Financial Resource Strain:    Difficulty of Paying Living Expenses:   Food Insecurity:    Worried About Charity fundraiser in the Last Year:    Arboriculturist in the Last Year:   Transportation Needs:    Film/video editor (Medical):    Lack of Transportation (Non-Medical):   Physical Activity:    Days of Exercise per Week:    Minutes of Exercise per Session:   Stress:    Feeling of Stress :   Social Connections:    Frequency of Communication with Friends and Family:    Frequency of Social Gatherings with Friends and Family:    Attends Religious Services:    Active Member of Clubs or Organizations:    Attends Music therapist:    Marital Status:      Family History: The patient's family history includes Alcohol abuse  in an other family member; Arthritis in an other family member; Drug abuse in an other family member; Hypertension in an other family member.  ROS:   Please see the history of present illness.    Chronic back pain  All other systems reviewed and are negative.  EKGs/Labs/Other Studies Reviewed:    The following studies were reviewed today: Echo 05/03/2018- Study Conclusions   -  Left ventricle: The cavity size was normal. Wall thickness was  increased in a pattern of moderate LVH. Systolic function was  normal. The estimated ejection fraction was in the range of 60%  to 65%. Wall motion was normal; there were no regional wall  motion abnormalities. Features are consistent with a pseudonormal  left ventricular filling pattern, with concomitant abnormal  relaxation and increased filling pressure (grade 2 diastolic  dysfunction).   EKG:  EKG is ordered today.  The ekg ordered today demonstrates NSR- HR 100  Recent Labs: No results found for requested labs within last 8760 hours.  Recent Lipid Panel    Component Value Date/Time   CHOL 100 05/03/2018 0235   TRIG 176 (H) 05/03/2018 0235   HDL 27 (L) 05/03/2018 0235   CHOLHDL 3.7 05/03/2018 0235   VLDL 35 05/03/2018 0235   LDLCALC 38 05/03/2018 0235   LDLDIRECT 46.0 10/26/2017 1532    Physical Exam:    VS:  BP (!) 120/60    Pulse 100    Ht 6\' 2"  (1.88 m)    Wt (!) 260 lb (117.9 kg)    BMI 33.38 kg/m     Wt Readings from Last 3 Encounters:  03/14/20 (!) 260 lb (117.9 kg)  09/18/18 263 lb (119.3 kg)  08/25/18 250 lb (113.4 kg)     GEN: Well nourished, well developed in no acute distress HEENT: Normal NECK: No JVD; No carotid bruits CARDIAC: RRR, no murmurs, rubs, gallops RESPIRATORY:  Clear to auscultation without rales, wheezing or rhonchi  ABDOMEN: Soft, non-tender, non-distended MUSCULOSKELETAL:  No edema; No deformity  SKIN: Warm and dry NEUROLOGIC:  Alert and oriented x 3 PSYCHIATRIC:  Normal affect    ASSESSMENT:    Orthostatic hypotension Pt c/o orthostatic symptoms x 2-3 weeks.  He is orthostatic in the office today- 140/62-laying at 30 degrees 120/62 sitting 98/58 standing  PAF (paroxysmal atrial fibrillation) [facility]) Atrial flutter Jan 2020- admitted to [facility].  Rx'd with DCCV and RFA. He was followed at [facility] until recently when he moved back to [place].  They changed him from Eliquis to Xarelto 15 mg, not clear why  CRI (chronic renal insufficiency), stage 3 (moderate) (HCC) SCr 1.56 Oct 2019- no recent labs available.  [place] PCP in [place] has been checking this  CAD (coronary artery disease), native coronary artery Cath 2008- medical Rx. Cath for chest pain 05/03/18- 30-40% Dx1, 60-70% OM2, 50-60% RCA Normal LVF- normal LVEDP- medical Rx  Type 2 diabetes mellitus with complication, without long-term current use of insulin (HCC) Taken off Glucophage secondary to nausea  PLAN:    STAT CBC and BMP.  Stop Chlorthalidone and Telmisartan. Add Protonix 40 mg daily.  I have requested his most recent labs from his PCP.  If his Hgb is significantly low he may need admission for further evaluation.  F/U with me in 2-3 weeks, Dr Stanford Breed in 3 months.   Medication Adjustments/Labs and Tests Ordered: Current medicines are reviewed at length with the patient today.  Concerns regarding medicines are outlined above.  No orders of the defined types were placed in this encounter.  No orders of the defined types were placed in this encounter.   Patient Instructions  Medication Instructions:  Kerin Ransom, PA-C, has recommended making the following medication changes: 1. HOLD Micardis and Chlorthalidone - do not take either of these medications until you receive results from your labwork 2. START TODAY - Pantoprazole 40 mg - take 1 tablet by mouth daily  *  If you need a refill on your cardiac medications before your next appointment, please call your pharmacy*   Lab Work: Your  physician recommends that you return for lab work TODAY. If you have labs (blood work) drawn today and your tests are completely normal, you will receive your results only by:  [place] (if you have MyChart) OR  A paper copy in the mail If you have any lab test that is abnormal or we need to change your treatment, we will call you to review the results.   Follow-Up: At [facility], you and your health needs are our priority.  As part of our continuing mission to provide you with exceptional heart care, we have created designated Provider Care Teams.  These Care Teams include your primary Cardiologist (physician) and Advanced Practice Providers (APPs -  Physician Assistants and Nurse Practitioners) who all work together to provide you with the care you need, when you need it.  We recommend signing up for the patient portal called "MyChart".  Sign up information is provided on this After Visit Summary.  MyChart is used to connect with patients for Virtual Visits (Telemedicine).  Patients are able to view lab/test results, encounter notes, upcoming appointments, etc.  Non-urgent messages can be sent to your provider as well.   To learn more about what you can do with MyChart, go to [email or web address].    Your next appointment:   2 week(s)  The format for your next appointment:   In Person  Provider:   Kerin Ransom, PA-C   Other Instructions Your physician recommends that you schedule a follow-up appointment in: 3-4 months to reestablish care with Dr Stanford Breed    Signed, Kerin Ransom, PA-C  03/14/2020 9:51 AM    [place]

## 2020-03-14 NOTE — Assessment & Plan Note (Signed)
Pt c/o orthostatic symptoms x 2-3 weeks.  He is orthostatic in the office today- 140/62-laying at 30 degrees 120/62 sitting 98/58 standing

## 2020-03-14 NOTE — Assessment & Plan Note (Signed)
Taken off Glucophage secondary to nausea

## 2020-03-14 NOTE — Assessment & Plan Note (Signed)
SCr 1.56 Oct 2019- no recent labs available.  His PCP in [place] has been checking this

## 2020-03-14 NOTE — Assessment & Plan Note (Signed)
Cath 2008- medical Rx. Cath for chest pain 05/03/18- 30-40% Dx1, 60-70% OM2, 50-60% RCA Normal LVF- normal LVEDP- medical Rx 

## 2020-03-14 NOTE — Patient Instructions (Signed)
Medication Instructions:  Kerin Ransom, PA-C, has recommended making the following medication changes: 1. HOLD Micardis and Chlorthalidone - do not take either of these medications until you receive results from your labwork 2. START TODAY - Pantoprazole 40 mg - take 1 tablet by mouth daily  *If you need a refill on your cardiac medications before your next appointment, please call your pharmacy*   Lab Work: Your physician recommends that you return for lab work TODAY. If you have labs (blood work) drawn today and your tests are completely normal, you will receive your results only by: [place] MyChart Message (if you have MyChart) OR . A paper copy in the mail If you have any lab test that is abnormal or we need to change your treatment, we will call you to review the results.   Follow-Up: At [facility], you and your health needs are our priority.  As part of our continuing mission to provide you with exceptional heart care, we have created designated Provider Care Teams.  These Care Teams include your primary Cardiologist (physician) and Advanced Practice Providers (APPs -  Physician Assistants and Nurse Practitioners) who all work together to provide you with the care you need, when you need it.  We recommend signing up for the patient portal called "MyChart".  Sign up information is provided on this After Visit Summary.  MyChart is used to connect with patients for Virtual Visits (Telemedicine).  Patients are able to view lab/test results, encounter notes, upcoming appointments, etc.  Non-urgent messages can be sent to your provider as well.   To learn more about what you can do with MyChart, go to [email or web address].    Your next appointment:   2 week(s)  The format for your next appointment:   In Person  Provider:   Kerin Ransom, PA-C   Other Instructions Your physician recommends that you schedule a follow-up appointment in: 3-4 months to reestablish care with Dr Stanford Breed

## 2020-03-19 ENCOUNTER — Telehealth: Payer: Self-pay

## 2020-03-19 NOTE — Telephone Encounter (Addendum)
Could not leave a voice message for the patient due to voicemail box being full. Will try calling again.  ----- Message from Erlene Quan, PA-C sent at 03/17/2020  8:01 AM EDT ----- Please let the patient know his labs showed he was not anemic but he was probably dehydrated.  Stopping the Chlorthalidone and Telmisartan was the right thing to do.  If he is doing OK keep f/u as scheduled, if not let us know.  Kerin Ransom PA-C 03/17/2020 8:01 AM

## 2020-03-25 DIAGNOSIS — Z79899 Other long term (current) drug therapy: Secondary | ICD-10-CM | POA: Diagnosis not present

## 2020-03-25 DIAGNOSIS — I4892 Unspecified atrial flutter: Secondary | ICD-10-CM | POA: Diagnosis not present

## 2020-03-25 DIAGNOSIS — E1165 Type 2 diabetes mellitus with hyperglycemia: Secondary | ICD-10-CM | POA: Diagnosis not present

## 2020-03-25 DIAGNOSIS — I10 Essential (primary) hypertension: Secondary | ICD-10-CM | POA: Diagnosis not present

## 2020-03-25 DIAGNOSIS — I25118 Atherosclerotic heart disease of native coronary artery with other forms of angina pectoris: Secondary | ICD-10-CM | POA: Diagnosis not present

## 2020-03-25 DIAGNOSIS — Z139 Encounter for screening, unspecified: Secondary | ICD-10-CM | POA: Diagnosis not present

## 2020-03-26 ENCOUNTER — Encounter: Payer: Self-pay | Admitting: *Deleted

## 2020-03-28 ENCOUNTER — Encounter: Payer: Self-pay | Admitting: Cardiology

## 2020-03-28 ENCOUNTER — Ambulatory Visit: Payer: Medicare Other | Admitting: Cardiology

## 2020-03-28 ENCOUNTER — Other Ambulatory Visit: Payer: Self-pay

## 2020-03-28 VITALS — BP 138/72 | HR 88 | Ht 74.0 in | Wt 268.0 lb

## 2020-03-28 DIAGNOSIS — N183 Chronic kidney disease, stage 3 unspecified: Secondary | ICD-10-CM

## 2020-03-28 DIAGNOSIS — I119 Hypertensive heart disease without heart failure: Secondary | ICD-10-CM

## 2020-03-28 DIAGNOSIS — Z7901 Long term (current) use of anticoagulants: Secondary | ICD-10-CM

## 2020-03-28 DIAGNOSIS — I480 Paroxysmal atrial fibrillation: Secondary | ICD-10-CM

## 2020-03-28 DIAGNOSIS — E118 Type 2 diabetes mellitus with unspecified complications: Secondary | ICD-10-CM

## 2020-03-28 DIAGNOSIS — I2510 Atherosclerotic heart disease of native coronary artery without angina pectoris: Secondary | ICD-10-CM

## 2020-03-28 DIAGNOSIS — E785 Hyperlipidemia, unspecified: Secondary | ICD-10-CM | POA: Diagnosis not present

## 2020-03-28 DIAGNOSIS — I951 Orthostatic hypotension: Secondary | ICD-10-CM | POA: Diagnosis not present

## 2020-03-28 MED ORDER — LOSARTAN POTASSIUM 25 MG PO TABS: 25.0000 mg | ORAL_TABLET | Freq: Every day | ORAL | 1 refills | Status: DC

## 2020-03-28 NOTE — Progress Notes (Signed)
Cardiology Office Note:    Date:  03/28/2020   ID:  Jack Farmer, DOB [birth year], MRN [id number]  PCP:  Philmore Pali, NP  Cardiologist:  Kirk Ruths, MD  Electrophysiologist:  None   Referring MD: Philmore Pali, NP   No chief complaint on file.   History of Present Illness:    Jack Farmer is a 79 y.o. male with a hx of coronary disease documented by catheterization September 2019.  He has moderate diffuse coronary disease and has been treated medically.  He has a history of PAF and in January 2020 underwent cardioversion and radiofrequency ablation at [facility].  He was placed on Eliquis after this.  He was seen last by Dr. Stanford Breed in February 2020.  He then moved to [facility] to be closer to his daughter.  He was followed at the by the [facility] EP clinic.  At some point he was changed from Eliquis to Xarelto 15 mg.  He is actually been stable from his atrial fibrillation standpoint.  Other medical issues include non-insulin-dependent diabetes, chronic renal insufficiency stage III, hypertension, chronic back pain, and dyslipidemia.  He has now moved back to the area and contacted [place] to be reestablished.  I saw him in the office 03/14/2020. He had complained of increasing shortness of breath and tachycardia with exertion as well as dizziness when standing.    His amlodipine had been stopped by [facility] EP.  He has not been back in atrial fibrillation as far as we can tell.  When seen in the office 03/14/2020 he was significantly orthostatic with a blood pressure of 140/62 lying, 120/62 sitting, and 98/58 standing.  He was also tachycardic with a heart rate of 105.  At that visit I ordered labs and stopped his Chlorthalidone 25 mg and Micardis 80mg .  His labs did suggest some degree of dehydration. He was not significantly anemic. He was then seen by his PCP. His changed to Warfarin secondary to cost.    Since then he has felt a little better.  He says his home B/P cuff reads  very high- sometimes 409 systolic.  In the office today his B/P with a large cuff read- 162/86 laying 140/70 sitting 132/70 standing  Past Medical History:  Diagnosis Date  . Arthritis   . CAD (coronary artery disease) 05/03/2018   moderate CAD, normal LVF- medical rx  . Frequent urination   . GERD (gastroesophageal reflux disease)   . Hyperlipidemia   . Hypertension 05/03/2018   normal LVF, moderate LVH, grade 2 DD  . Hypertrophy of prostate with urinary obstruction and other lower urinary tract symptoms (LUTS)   . Low back pain   . Low HDL (under 40)   . Shortness of breath dyspnea    with ambulation  . Skin cancer    removed from face    Past Surgical History:  Procedure Laterality Date  . ANTERIOR LAT LUMBAR FUSION  04/07/2012   Procedure: ANTERIOR LATERAL LUMBAR FUSION 2 LEVELS;  Surgeon: Eustace Moore, MD;  Location: [place] NEURO ORS;  Service: Neurosurgery;  Laterality: Right;  Lumbar three-four and four-five extreme lumbar interbody fusion  . BACK SURGERY    . CARDIAC CATHETERIZATION    . COLONOSCOPY W/ POLYPECTOMY    . EYE SURGERY     tightened muscles in right eye  . LEFT HEART CATH AND CORONARY ANGIOGRAPHY N/A 05/03/2018   Procedure: LEFT HEART CATH AND CORONARY ANGIOGRAPHY;  Surgeon: Belva Crome,  MD;  Location: [place] CV LAB;  Service: Cardiovascular;  Laterality: N/A;  . TONSILLECTOMY      Current Medications: Current Meds  Medication Sig  . Ascorbic Acid (VITAMIN C) 1000 MG tablet Take 1,000 mg by mouth daily.  Marland Kitchen aspirin EC 81 MG tablet Take 81 mg by mouth daily.  Marland Kitchen atorvastatin (LIPITOR) 80 MG tablet Take 80 mg by mouth daily.  . isosorbide mononitrate (IMDUR) 30 MG 24 hr tablet TAKE 1 TABLET BY MOUTH EVERY DAY  . metoprolol tartrate (LOPRESSOR) 50 MG tablet Take 1 tablet (50 mg total) by mouth 2 (two) times daily.  . nitroGLYCERIN (NITROSTAT) 0.4 MG SL tablet Place 1 tablet (0.4 mg total) under the tongue every 5 (five) minutes x 3 doses as needed for  chest pain.  . pantoprazole (PROTONIX) 40 MG tablet Take 1 tablet (40 mg total) by mouth daily.  . pioglitazone (ACTOS) 30 MG tablet Take 30 mg by mouth daily.  . repaglinide (PRANDIN) 1 MG tablet Take 1 mg by mouth 2 (two) times daily before a meal.   . tiZANidine (ZANAFLEX) 4 MG tablet TAKE 1 TABLET BY MOUTH AT BEDTIME  . traMADol (ULTRAM) 50 MG tablet Take 1 tablet (50 mg total) by mouth every 6 (six) hours as needed.  . vitamin E 400 UNIT capsule Take 400 Units by mouth daily.  Marland Kitchen warfarin (COUMADIN) 3 MG tablet Take 3 mg by mouth daily.  . [DISCONTINUED] chlorthalidone (HYGROTON) 25 MG tablet Take 1 tablet (25 mg total) by mouth daily.  . [DISCONTINUED] telmisartan (MICARDIS) 80 MG tablet TAKE 1 TABLET BY MOUTH EVERY DAY     Allergies:   Cephalexin and Indocin [indomethacin]   Social History   Socioeconomic History  . Marital status: Married    Spouse name: Not on file  . Number of children: 4  . Years of education: Not on file  . Highest education level: Not on file  Occupational History  . Not on file  Tobacco Use  . Smoking status: Current Some Day Smoker    Types: Cigarettes    Last attempt to quit: 08/16/2006    Years since quitting: 13.6  . Smokeless tobacco: Never Used  Substance and Sexual Activity  . Alcohol use: Yes    Comment: Rare  . Drug use: No  . Sexual activity: Yes  Other Topics Concern  . Not on file  Social History Narrative  . Not on file   Social Determinants of Health   Financial Resource Strain:   . Difficulty of Paying Living Expenses:   Food Insecurity:   . Worried About Charity fundraiser in the Last Year:   . Arboriculturist in the Last Year:   Transportation Needs:   . Film/video editor (Medical):   Marland Kitchen Lack of Transportation (Non-Medical):   Physical Activity:   . Days of Exercise per Week:   . Minutes of Exercise per Session:   Stress:   . Feeling of Stress :   Social Connections:   . Frequency of Communication with Friends  and Family:   . Frequency of Social Gatherings with Friends and Family:   . Attends Religious Services:   . Active Member of Clubs or Organizations:   . Attends Archivist Meetings:   Marland Kitchen Marital Status:      Family History: The patient's family history includes Alcohol abuse in an other family member; Arthritis in an other family member; Drug abuse in an other family  member; Hypertension in an other family member.  ROS:   Please see the history of present illness.     All other systems reviewed and are negative.  EKGs/Labs/Other Studies Reviewed:    The following studies were reviewed today: Echo Sept 2019- Study Conclusions   - Left ventricle: The cavity size was normal. Wall thickness was  increased in a pattern of moderate LVH. Systolic function was  normal. The estimated ejection fraction was in the range of 60%  to 65%. Wall motion was normal; there were no regional wall  motion abnormalities. Features are consistent with a pseudonormal  left ventricular filling pattern, with concomitant abnormal  relaxation and increased filling pressure (grade 2 diastolic  dysfunction).   EKG:  EKG is not ordered today.  The ekg ordered 03/17/2020 demonstrates NSR-ST 102  Recent Labs: 03/14/2020: BUN 25; Creatinine, Ser 1.79; Hemoglobin 12.6; Platelets 237; Potassium 4.0; Sodium 139  Recent Lipid Panel    Component Value Date/Time   CHOL 100 05/03/2018 0235   TRIG 176 (H) 05/03/2018 0235   HDL 27 (L) 05/03/2018 0235   CHOLHDL 3.7 05/03/2018 0235   VLDL 35 05/03/2018 0235   LDLCALC 38 05/03/2018 0235   LDLDIRECT 46.0 10/26/2017 1532    Physical Exam:    VS:  BP 138/72   Pulse 88   Ht 6\' 2"  (1.88 m)   Wt 268 lb (121.6 kg)   SpO2 98%   BMI 34.41 kg/m     Wt Readings from Last 3 Encounters:  03/28/20 268 lb (121.6 kg)  03/14/20 (!) 260 lb (117.9 kg)  09/18/18 263 lb (119.3 kg)     GEN: Overweight male,well developed in no acute distress HEENT:  Normal NECK: No JVD; No carotid bruits CARDIAC: RRR, no murmurs, rubs, gallops RESPIRATORY:  Clear to auscultation without rales, wheezing or rhonchi  ABDOMEN: Soft, non-tender, non-distended MUSCULOSKELETAL:  trace edema; No deformity  SKIN: Warm and dry NEUROLOGIC:  Alert and oriented x 3 PSYCHIATRIC:  Normal affect   ASSESSMENT:    Orthostatic hypotension B/P improved after adjustment in his medications. Will add back losartan 25 mg Q HS.  Next would add HCTZ 12.5 mg if needed.  PAF (paroxysmal atrial fibrillation) [facility]) Atrial flutter Jan 2020- admitted to [facility].  Rx'd with DCCV and RFA. He was followed at [facility] until recently when he moved back to [place].  They changed him from Eliquis to Xarelto 15 mg.  He told his PCP he could not afford Xarelto and is now on Warfarin- followed by PCP.   CRI (chronic renal insufficiency), stage 3 (moderate) (HCC) SCr 1.79 July 2021.   His PCP in [place] has been checking this- he is to f/u with her in a week- BMP then  CAD (coronary artery disease), native coronary artery Cath 2008- medical Rx. Cath for chest pain 05/03/18- 30-40% Dx1, 60-70% OM2, 50-60% RCA Normal LVF- normal LVEDP- medical Rx  Type 2 diabetes mellitus with complication, without long-term current use of insulin (HCC) Taken off Glucophage secondary to nausea. Recently taken off Jardiance secondary to cost.   PLAN:    Losartan 25 mg Q HS.  F/U B/P and BMP with PCP- I suggested he bring his home cuff with him to compare readings. Consider addition of HCTZ low dose if needed for HTN next but renal function will have to be monitored closely. Hesitant to use Amlodipine with LE edema.  F/U Dr Stanford Breed as scheduled.   Medication Adjustments/Labs and Tests Ordered: Current medicines are reviewed  at length with the patient today.  Concerns regarding medicines are outlined above.  No orders of the defined types were placed in this encounter.  Meds ordered this encounter   Medications  . losartan (COZAAR) 25 MG tablet    Sig: Take 1 tablet (25 mg total) by mouth at bedtime.    Dispense:  90 tablet    Refill:  1    Patient Instructions  Medication Instructions:   START Losartan 25 mg daily at bedtime.  *If you need a refill on your cardiac medications before your next appointment, please call your pharmacy*   Lab Work: Please have a BMET (Basic Metabolic Panel) drawn when you see your PCP next week.   Follow-Up: At [facility], you and your health needs are our priority.  As part of our continuing mission to provide you with exceptional heart care, we have created designated Provider Care Teams.  These Care Teams include your primary Cardiologist (physician) and Advanced Practice Providers (APPs -  Physician Assistants and Nurse Practitioners) who all work together to provide you with the care you need, when you need it.  We recommend signing up for the patient portal called "MyChart".  Sign up information is provided on this After Visit Summary.  MyChart is used to connect with patients for Virtual Visits (Telemedicine).  Patients are able to view lab/test results, encounter notes, upcoming appointments, etc.  Non-urgent messages can be sent to your provider as well.   To learn more about what you can do with MyChart, go to [email or web address].    Your next appointment:   Monday, November 1st at 10:00 AM  The format for your next appointment:   In Person  Provider:   Kirk Ruths, MD      Signed, Kerin Ransom, PA-C  03/28/2020 12:14 PM    [place]

## 2020-03-28 NOTE — Patient Instructions (Signed)
Medication Instructions:   START Losartan 25 mg daily at bedtime.  *If you need a refill on your cardiac medications before your next appointment, please call your pharmacy*   Lab Work: Please have a BMET (Basic Metabolic Panel) drawn when you see your PCP next week.   Follow-Up: At [facility], you and your health needs are our priority.  As part of our continuing mission to provide you with exceptional heart care, we have created designated Provider Care Teams.  These Care Teams include your primary Cardiologist (physician) and Advanced Practice Providers (APPs -  Physician Assistants and Nurse Practitioners) who all work together to provide you with the care you need, when you need it.  We recommend signing up for the patient portal called "MyChart".  Sign up information is provided on this After Visit Summary.  MyChart is used to connect with patients for Virtual Visits (Telemedicine).  Patients are able to view lab/test results, encounter notes, upcoming appointments, etc.  Non-urgent messages can be sent to your provider as well.   To learn more about what you can do with MyChart, go to [email or web address].    Your next appointment:   Monday, November 1st at 10:00 AM  The format for your next appointment:   In Person  Provider:   Kirk Ruths, MD

## 2020-04-04 DIAGNOSIS — D6869 Other thrombophilia: Secondary | ICD-10-CM | POA: Diagnosis not present

## 2020-04-04 DIAGNOSIS — R600 Localized edema: Secondary | ICD-10-CM | POA: Diagnosis not present

## 2020-04-04 DIAGNOSIS — E1165 Type 2 diabetes mellitus with hyperglycemia: Secondary | ICD-10-CM | POA: Diagnosis not present

## 2020-04-11 DIAGNOSIS — Z7901 Long term (current) use of anticoagulants: Secondary | ICD-10-CM | POA: Diagnosis not present

## 2020-04-11 DIAGNOSIS — D6869 Other thrombophilia: Secondary | ICD-10-CM | POA: Diagnosis not present

## 2020-04-18 DIAGNOSIS — D6869 Other thrombophilia: Secondary | ICD-10-CM | POA: Diagnosis not present

## 2020-04-18 DIAGNOSIS — Z7901 Long term (current) use of anticoagulants: Secondary | ICD-10-CM | POA: Diagnosis not present

## 2020-04-25 DIAGNOSIS — I509 Heart failure, unspecified: Secondary | ICD-10-CM | POA: Diagnosis not present

## 2020-04-25 DIAGNOSIS — E1165 Type 2 diabetes mellitus with hyperglycemia: Secondary | ICD-10-CM | POA: Diagnosis not present

## 2020-04-25 DIAGNOSIS — Z7901 Long term (current) use of anticoagulants: Secondary | ICD-10-CM | POA: Diagnosis not present

## 2020-04-25 DIAGNOSIS — D6869 Other thrombophilia: Secondary | ICD-10-CM | POA: Diagnosis not present

## 2020-05-05 DIAGNOSIS — I509 Heart failure, unspecified: Secondary | ICD-10-CM | POA: Diagnosis not present

## 2020-05-05 DIAGNOSIS — D6869 Other thrombophilia: Secondary | ICD-10-CM | POA: Diagnosis not present

## 2020-05-05 DIAGNOSIS — E1165 Type 2 diabetes mellitus with hyperglycemia: Secondary | ICD-10-CM | POA: Diagnosis not present

## 2020-05-05 DIAGNOSIS — Z23 Encounter for immunization: Secondary | ICD-10-CM | POA: Diagnosis not present

## 2020-05-27 DIAGNOSIS — H35372 Puckering of macula, left eye: Secondary | ICD-10-CM | POA: Diagnosis not present

## 2020-05-28 DIAGNOSIS — Z7901 Long term (current) use of anticoagulants: Secondary | ICD-10-CM | POA: Diagnosis not present

## 2020-06-10 NOTE — Progress Notes (Deleted)
HPI: FUcoronary artery disease. Abdominal ultrasound in August of 2010 showed no aneurysm. Last cardiac catheterization September 2019 showed 30 to 40% first diagonal, 60 to 70% circumflex, 50% obtuse marginal, 50 to 60% RCA, 50 to 60% PDA and ejection fraction 60%. Left ventricular end-diastolic pressure 18. Medical therapy recommended. Echocardiogram September 2019 showed normal LV function, moderate diastolic dysfunction and moderate left ventricular hypertrophy.  PYP scan January 2020 equivocal.  Patient was admitted to [facility] 1/20 with atrial flutter.  He had TEE guided cardioversion followed by ablation. Limited echo May 2020 at [facility] technically difficult; ejection fraction 55 to 60% with mild to moderate left ventricular hypertrophy.  Had atrial fibrillation ablation August 2020 at [facility]. Since last seen,  Current Outpatient Medications  Medication Sig Dispense Refill  . Ascorbic Acid (VITAMIN C) 1000 MG tablet Take 1,000 mg by mouth daily.    Marland Kitchen aspirin EC 81 MG tablet Take 81 mg by mouth daily.    Marland Kitchen atorvastatin (LIPITOR) 80 MG tablet Take 80 mg by mouth daily.    . isosorbide mononitrate (IMDUR) 30 MG 24 hr tablet TAKE 1 TABLET BY MOUTH EVERY DAY 30 tablet 8  . losartan (COZAAR) 25 MG tablet Take 1 tablet (25 mg total) by mouth at bedtime. 90 tablet 1  . metoprolol tartrate (LOPRESSOR) 50 MG tablet Take 1 tablet (50 mg total) by mouth 2 (two) times daily. 180 tablet 3  . nitroGLYCERIN (NITROSTAT) 0.4 MG SL tablet Place 1 tablet (0.4 mg total) under the tongue every 5 (five) minutes x 3 doses as needed for chest pain. 25 tablet 3  . pantoprazole (PROTONIX) 40 MG tablet Take 1 tablet (40 mg total) by mouth daily. 30 tablet 11  . pioglitazone (ACTOS) 30 MG tablet Take 30 mg by mouth daily.    . repaglinide (PRANDIN) 1 MG tablet Take 1 mg by mouth 2 (two) times daily before a meal.     . tiZANidine (ZANAFLEX) 4 MG tablet TAKE 1 TABLET BY MOUTH AT BEDTIME 30 tablet 1  . traMADol  (ULTRAM) 50 MG tablet Take 1 tablet (50 mg total) by mouth every 6 (six) hours as needed. 75 tablet 3  . vitamin E 400 UNIT capsule Take 400 Units by mouth daily.    Marland Kitchen warfarin (COUMADIN) 3 MG tablet Take 3 mg by mouth daily.     No current facility-administered medications for this visit.     Past Medical History:  Diagnosis Date  . Arthritis   . CAD (coronary artery disease) 05/03/2018   moderate CAD, normal LVF- medical rx  . Frequent urination   . GERD (gastroesophageal reflux disease)   . Hyperlipidemia   . Hypertension 05/03/2018   normal LVF, moderate LVH, grade 2 DD  . Hypertrophy of prostate with urinary obstruction and other lower urinary tract symptoms (LUTS)   . Low back pain   . Low HDL (under 40)   . Shortness of breath dyspnea    with ambulation  . Skin cancer    removed from face    Past Surgical History:  Procedure Laterality Date  . ANTERIOR LAT LUMBAR FUSION  04/07/2012   Procedure: ANTERIOR LATERAL LUMBAR FUSION 2 LEVELS;  Surgeon: Eustace Moore, MD;  Location: [place] NEURO ORS;  Service: Neurosurgery;  Laterality: Right;  Lumbar three-four and four-five extreme lumbar interbody fusion  . BACK SURGERY    . CARDIAC CATHETERIZATION    . COLONOSCOPY W/ POLYPECTOMY    . EYE SURGERY  tightened muscles in right eye  . LEFT HEART CATH AND CORONARY ANGIOGRAPHY N/A 05/03/2018   Procedure: LEFT HEART CATH AND CORONARY ANGIOGRAPHY;  Surgeon: Belva Crome, MD;  Location: [place] CV LAB;  Service: Cardiovascular;  Laterality: N/A;  . TONSILLECTOMY      Social History   Socioeconomic History  . Marital status: Married    Spouse name: Not on file  . Number of children: 4  . Years of education: Not on file  . Highest education level: Not on file  Occupational History  . Not on file  Tobacco Use  . Smoking status: Current Some Day Smoker    Types: Cigarettes    Last attempt to quit: 08/16/2006    Years since quitting: 13.8  . Smokeless tobacco: Never Used    Substance and Sexual Activity  . Alcohol use: Yes    Comment: Rare  . Drug use: No  . Sexual activity: Yes  Other Topics Concern  . Not on file  Social History Narrative  . Not on file   Social Determinants of Health   Financial Resource Strain:   . Difficulty of Paying Living Expenses: Not on file  Food Insecurity:   . Worried About Charity fundraiser in the Last Year: Not on file  . Ran Out of Food in the Last Year: Not on file  Transportation Needs:   . Lack of Transportation (Medical): Not on file  . Lack of Transportation (Non-Medical): Not on file  Physical Activity:   . Days of Exercise per Week: Not on file  . Minutes of Exercise per Session: Not on file  Stress:   . Feeling of Stress : Not on file  Social Connections:   . Frequency of Communication with Friends and Family: Not on file  . Frequency of Social Gatherings with Friends and Family: Not on file  . Attends Religious Services: Not on file  . Active Member of Clubs or Organizations: Not on file  . Attends Archivist Meetings: Not on file  . Marital Status: Not on file  Intimate Partner Violence:   . Fear of Current or Ex-Partner: Not on file  . Emotionally Abused: Not on file  . Physically Abused: Not on file  . Sexually Abused: Not on file    Family History  Problem Relation Age of Onset  . Alcohol abuse Other   . Drug abuse Other   . Arthritis Other   . Hypertension Other     ROS: no fevers or chills, productive cough, hemoptysis, dysphasia, odynophagia, melena, hematochezia, dysuria, hematuria, rash, seizure activity, orthopnea, PND, pedal edema, claudication. Remaining systems are negative.  Physical Exam: Well-developed well-nourished in no acute distress.  Skin is warm and dry.  HEENT is normal.  Neck is supple.  Chest is clear to auscultation with normal expansion.  Cardiovascular exam is regular rate and rhythm.  Abdominal exam nontender or distended. No masses  palpated. Extremities show no edema. neuro grossly intact  ECG- personally reviewed  A/P  1 coronary artery disease-patient denies chest pain.  Plan to continue statin.  No aspirin given need for anticoagulation.   2 Atrial fibrillation/atrial flutter-patient has had previous atrial flutter and atrial fibrillation ablation.  He remains in sinus rhythm.  Continue Xarelto.  3 hypertension-patient's blood pressure is controlled.  Continue present medications and follow.  4 hyperlipidemia-continue statin.  5 chronic stage III kidney disease-monitored by nephrology.  Kirk Ruths, MD

## 2020-06-11 DIAGNOSIS — H43813 Vitreous degeneration, bilateral: Secondary | ICD-10-CM | POA: Diagnosis not present

## 2020-06-11 DIAGNOSIS — H3581 Retinal edema: Secondary | ICD-10-CM | POA: Diagnosis not present

## 2020-06-11 DIAGNOSIS — H43392 Other vitreous opacities, left eye: Secondary | ICD-10-CM | POA: Diagnosis not present

## 2020-06-11 DIAGNOSIS — H35372 Puckering of macula, left eye: Secondary | ICD-10-CM | POA: Diagnosis not present

## 2020-06-16 ENCOUNTER — Ambulatory Visit: Payer: Medicare Other | Admitting: Cardiology

## 2020-07-02 DIAGNOSIS — I4892 Unspecified atrial flutter: Secondary | ICD-10-CM | POA: Diagnosis not present

## 2020-07-02 DIAGNOSIS — Z7901 Long term (current) use of anticoagulants: Secondary | ICD-10-CM | POA: Diagnosis not present

## 2020-07-02 DIAGNOSIS — I10 Essential (primary) hypertension: Secondary | ICD-10-CM | POA: Diagnosis not present

## 2020-07-02 DIAGNOSIS — Z79899 Other long term (current) drug therapy: Secondary | ICD-10-CM | POA: Diagnosis not present

## 2020-07-02 DIAGNOSIS — E1165 Type 2 diabetes mellitus with hyperglycemia: Secondary | ICD-10-CM | POA: Diagnosis not present

## 2020-07-02 DIAGNOSIS — I25118 Atherosclerotic heart disease of native coronary artery with other forms of angina pectoris: Secondary | ICD-10-CM | POA: Diagnosis not present

## 2020-07-14 DIAGNOSIS — Z9181 History of falling: Secondary | ICD-10-CM | POA: Diagnosis not present

## 2020-07-14 DIAGNOSIS — Z0000 Encounter for general adult medical examination without abnormal findings: Secondary | ICD-10-CM | POA: Diagnosis not present

## 2020-07-14 DIAGNOSIS — E785 Hyperlipidemia, unspecified: Secondary | ICD-10-CM | POA: Diagnosis not present

## 2020-07-16 DIAGNOSIS — R791 Abnormal coagulation profile: Secondary | ICD-10-CM | POA: Diagnosis not present

## 2020-07-18 ENCOUNTER — Telehealth: Payer: Self-pay

## 2020-07-18 NOTE — Telephone Encounter (Signed)
Primary Cardiologist:Brian Stanford Breed, MD  Chart reviewed as part of pre-operative protocol coverage. Because of Jack Farmer's past medical history and time since last visit, he/she will require a follow-up visit in order to better assess preoperative cardiovascular risk.  Pre-op covering staff: - Please schedule appointment and call patient to inform them. - Please contact requesting surgeon's office via preferred method (i.e, phone, fax) to inform them of need for appointment prior to surgery.  If applicable, this message will also be routed to pharmacy pool and/or primary cardiologist for input on holding anticoagulant/antiplatelet agent as requested below so that this information is available at time of patient's appointment.   Deberah Pelton, NP  07/18/2020, 2:46 PM

## 2020-07-18 NOTE — Telephone Encounter (Signed)
   [facility] Pre-operative Risk Assessment    Request for surgical clearance:  1. What type of surgery is being performed? Vitrectomy, membrane peel, intravitreal triesence and posterior capsular opcification removal   2. When is this surgery scheduled? 08-11-2020   3. What type of clearance is required (medical clearance vs. Pharmacy clearance to hold med vs. Both)? BOTH  4. Are there any medications that need to be held prior to surgery and how long? COUMADIN-7DAYS   5. Practice name and name of physician performing surgery? [facility]    6. What is the office phone number? [phone]   7.   What is the office fax number? [phone]  8.   Anesthesia type (None, local, MAC, general) ? MAC

## 2020-07-18 NOTE — Telephone Encounter (Signed)
Patient with diagnosis of afib on warfarin for anticoagulation.    Procedure: Vitrectomy, membrane peel, intravitreal triesence and posterior capsular opcification removal  Date of procedure: 08/11/2020    CHA2DS2-VASc Score = 5  This indicates a 7.2% annual risk of stroke. The patient's score is based upon: CHF History: 0 HTN History: 1 Diabetes History: 1 Stroke History: 0 Vascular Disease History: 1 Age Score: 2 Gender Score: 0     Per office protocol, patient can hold warfarin for 5 days prior to procedure.    Patient will NOT need bridging with Lovenox (enoxaparin) around procedure.

## 2020-07-21 NOTE — Telephone Encounter (Signed)
Send to scheduler to schedule appt for pre op clearance

## 2020-07-22 NOTE — Telephone Encounter (Signed)
Pt has been scheduled to see Kerin Ransom, PA-C, 07/31/20.  Clearance will be addressed at that time.  Will route to the requesting surgeon's office to make them aware.

## 2020-07-30 ENCOUNTER — Encounter: Payer: Self-pay | Admitting: Cardiology

## 2020-07-30 DIAGNOSIS — I4892 Unspecified atrial flutter: Secondary | ICD-10-CM | POA: Diagnosis not present

## 2020-07-30 DIAGNOSIS — Z01818 Encounter for other preprocedural examination: Secondary | ICD-10-CM | POA: Insufficient documentation

## 2020-07-30 DIAGNOSIS — D6869 Other thrombophilia: Secondary | ICD-10-CM | POA: Diagnosis not present

## 2020-07-30 HISTORY — DX: Encounter for other preprocedural examination: Z01.818

## 2020-07-31 ENCOUNTER — Encounter: Payer: Self-pay | Admitting: Cardiology

## 2020-07-31 ENCOUNTER — Other Ambulatory Visit: Payer: Self-pay

## 2020-07-31 ENCOUNTER — Ambulatory Visit: Payer: Medicare Other | Admitting: Cardiology

## 2020-07-31 VITALS — BP 126/64 | HR 79 | Ht 74.0 in | Wt 276.2 lb

## 2020-07-31 DIAGNOSIS — I480 Paroxysmal atrial fibrillation: Secondary | ICD-10-CM

## 2020-07-31 DIAGNOSIS — E785 Hyperlipidemia, unspecified: Secondary | ICD-10-CM

## 2020-07-31 DIAGNOSIS — Z01818 Encounter for other preprocedural examination: Secondary | ICD-10-CM

## 2020-07-31 DIAGNOSIS — I2510 Atherosclerotic heart disease of native coronary artery without angina pectoris: Secondary | ICD-10-CM

## 2020-07-31 DIAGNOSIS — N183 Chronic kidney disease, stage 3 unspecified: Secondary | ICD-10-CM

## 2020-07-31 DIAGNOSIS — I119 Hypertensive heart disease without heart failure: Secondary | ICD-10-CM

## 2020-07-31 DIAGNOSIS — Z7901 Long term (current) use of anticoagulants: Secondary | ICD-10-CM

## 2020-07-31 DIAGNOSIS — I951 Orthostatic hypotension: Secondary | ICD-10-CM

## 2020-07-31 NOTE — Assessment & Plan Note (Signed)
Warfarin per PCP (cost) as of July 2021- CHADS VASC=4

## 2020-07-31 NOTE — Assessment & Plan Note (Signed)
SCr 1.79 July 2020- PCP following

## 2020-07-31 NOTE — Assessment & Plan Note (Signed)
Atrial flutter Jan 2020- admitted to [facility].  Rx'd with DCCV and RFA. NSR today

## 2020-07-31 NOTE — Telephone Encounter (Signed)
   Primary Cardiologist: Kirk Ruths, MD  Chart reviewed and patient seen in the office today as part of pre-operative protocol coverage. Given past medical history and time since last visit, based on ACC/AHA guidelines, Jack Farmer would be at acceptable risk for the planned procedure without further cardiovascular testing.   OK to hold Warfarin 5 days pre op.  No need for Lovenox bridging. If needed aspirin may be held 3-5 days pre op as well.   The patient was advised that if he develops new symptoms prior to surgery to contact our office to arrange for a follow-up visit, and he verbalized understanding.  I will route this recommendation to the requesting party via Epic fax function and remove from pre-op pool.  Please call with questions.  Kerin Ransom, PA-C 07/31/2020, 9:46 AM

## 2020-07-31 NOTE — Patient Instructions (Signed)
Medication Instructions:  Continue current medications  *If you need a refill on your cardiac medications before your next appointment, please call your pharmacy*   Lab Work: None Ordered   Testing/Procedures: None Ordered   Follow-Up: At [facility], you and your health needs are our priority.  As part of our continuing mission to provide you with exceptional heart care, we have created designated Provider Care Teams.  These Care Teams include your primary Cardiologist (physician) and Advanced Practice Providers (APPs -  Physician Assistants and Nurse Practitioners) who all work together to provide you with the care you need, when you need it.  We recommend signing up for the patient portal called "MyChart".  Sign up information is provided on this After Visit Summary.  MyChart is used to connect with patients for Virtual Visits (Telemedicine).  Patients are able to view lab/test results, encounter notes, upcoming appointments, etc.  Non-urgent messages can be sent to your provider as well.   To learn more about what you can do with MyChart, go to [email or web address].    Your next appointment:   Keep appointment on February 8th @ 10:00 am  The format for your next appointment:   In Person  Provider:   You may see Kirk Ruths, MD or one of the following Advanced Practice Providers on your designated Care Team:    Kerin Ransom, PA-C  [place], [place]  Coletta Memos, [place]

## 2020-07-31 NOTE — Assessment & Plan Note (Signed)
Echo Sept 2019- EF 60-65% with moderate LVH and grade 2 DD

## 2020-07-31 NOTE — Assessment & Plan Note (Signed)
Pt is an acceptable risk for the proposed procedure without further cardiac work up.  OK to hold Warfarin 5 days pre op and aspirin 3-5 days pre op if needed. I will fax clearance to the operating surgeon.

## 2020-07-31 NOTE — Assessment & Plan Note (Signed)
Resolved after medication adjustment July 2021

## 2020-07-31 NOTE — Assessment & Plan Note (Signed)
Cath 2008- medical Rx. Cath for chest pain 05/03/18- 30-40% Dx1, 60-70% OM2, 50-60% RCA Normal LVF- normal LVEDP- medical Rx

## 2020-07-31 NOTE — Progress Notes (Signed)
Cardiology Office Note:    Date:  07/31/2020   ID:  Jack Farmer, DOB 1941/05/20, MRN [id number]  PCP:  Philmore Pali, NP  Cardiologist:  Kirk Ruths, MD  Electrophysiologist:  None   Referring MD: Philmore Pali, NP   No chief complaint on file. Pre op clearance  History of Present Illness:    Jack Farmer is a 79 y.o. male with a hx of CAD documented by cath September 2019. He has moderate diffuse coronary disease and has been treated medically. He has a history of PAF and in January 2020 underwent cardioversion and RFA at [facility]. He was placed on Eliquis after this. He then moved to [facility] to be closer to his daughter. Other medical issues include NIDDM, CRI stage 3B, HTN, chronic back pain, and dyslipidemia.  He has now moved back to the area and contacted [place] to be reestablished. I saw him in the office 03/14/2020 to be re established. He was significantly orthostatic with a blood pressure of 140/62 lying, 120/62 sitting, and 98/58 standing. He was also tachycardic with a heart rate of 105.  At that visit I ordered labs and stopped his Chlorthalidone 25 mg and Micardis 80mg .  His labs did suggest some degree of dehydration. He was not significantly anemic. Since then he has done well.  His PCP follows his INR and renal function.   He is in the office today for pre op clearance prior to eye surgery.  He denies any chest pain or unusual dyspnea.  He has not had recurrent orthostatic symptoms.    Past Medical History:  Diagnosis Date  . Arthritis   . CAD (coronary artery disease) 05/03/2018   moderate CAD, normal LVF- medical rx  . Frequent urination   . GERD (gastroesophageal reflux disease)   . Hyperlipidemia   . Hypertension 05/03/2018   normal LVF, moderate LVH, grade 2 DD  . Hypertrophy of prostate with urinary obstruction and other lower urinary tract symptoms (LUTS)   . Low back pain   . Low HDL (under 40)   . Shortness of breath dyspnea     with ambulation  . Skin cancer    removed from face    Past Surgical History:  Procedure Laterality Date  . ANTERIOR LAT LUMBAR FUSION  04/07/2012   Procedure: ANTERIOR LATERAL LUMBAR FUSION 2 LEVELS;  Surgeon: Eustace Moore, MD;  Location: [place] NEURO ORS;  Service: Neurosurgery;  Laterality: Right;  Lumbar three-four and four-five extreme lumbar interbody fusion  . BACK SURGERY    . CARDIAC CATHETERIZATION    . COLONOSCOPY W/ POLYPECTOMY    . EYE SURGERY     tightened muscles in right eye  . LEFT HEART CATH AND CORONARY ANGIOGRAPHY N/A 05/03/2018   Procedure: LEFT HEART CATH AND CORONARY ANGIOGRAPHY;  Surgeon: Belva Crome, MD;  Location: [place] CV LAB;  Service: Cardiovascular;  Laterality: N/A;  . TONSILLECTOMY      Current Medications: Current Meds  Medication Sig  . Ascorbic Acid (VITAMIN C) 1000 MG tablet Take 1,000 mg by mouth daily.  Marland Kitchen aspirin EC 81 MG tablet Take 81 mg by mouth daily.  Marland Kitchen atorvastatin (LIPITOR) 80 MG tablet Take 80 mg by mouth daily.  Marland Kitchen losartan (COZAAR) 25 MG tablet Take 1 tablet (25 mg total) by mouth at bedtime.  . metoprolol tartrate (LOPRESSOR) 50 MG tablet Take 1 tablet (50 mg total) by mouth 2 (two) times daily.  . nitroGLYCERIN (  NITROSTAT) 0.4 MG SL tablet Place 1 tablet (0.4 mg total) under the tongue every 5 (five) minutes x 3 doses as needed for chest pain.  . pantoprazole (PROTONIX) 40 MG tablet Take 1 tablet (40 mg total) by mouth daily.  . pioglitazone (ACTOS) 30 MG tablet Take 30 mg by mouth daily.  . repaglinide (PRANDIN) 1 MG tablet Take 1 mg by mouth 2 (two) times daily before a meal.   . tiZANidine (ZANAFLEX) 4 MG tablet TAKE 1 TABLET BY MOUTH AT BEDTIME  . traMADol (ULTRAM) 50 MG tablet Take 1 tablet (50 mg total) by mouth every 6 (six) hours as needed.  . vitamin E 400 UNIT capsule Take 400 Units by mouth daily.  Marland Kitchen warfarin (COUMADIN) 3 MG tablet Take 3 mg by mouth daily.     Allergies:   Cephalexin and Indocin [indomethacin]    Social History   Socioeconomic History  . Marital status: Married    Spouse name: Not on file  . Number of children: 4  . Years of education: Not on file  . Highest education level: Not on file  Occupational History  . Not on file  Tobacco Use  . Smoking status: Current Some Day Smoker    Types: Cigarettes    Last attempt to quit: 08/16/2006    Years since quitting: 13.9  . Smokeless tobacco: Never Used  Substance and Sexual Activity  . Alcohol use: Yes    Comment: Rare  . Drug use: No  . Sexual activity: Yes  Other Topics Concern  . Not on file  Social History Narrative  . Not on file   Social Determinants of Health   Financial Resource Strain: Not on file  Food Insecurity: Not on file  Transportation Needs: Not on file  Physical Activity: Not on file  Stress: Not on file  Social Connections: Not on file     Family History: The patient's family history includes Alcohol abuse in an other family member; Arthritis in an other family member; Drug abuse in an other family member; Hypertension in an other family member.  ROS:   Please see the history of present illness.     All other systems reviewed and are negative.  EKGs/Labs/Other Studies Reviewed:    The following studies were reviewed today: Cath 05/03/2018-  Moderate, nonobstructive three-vessel CAD  Normal left main  LAD does not wraparound the left ventricular apex.  The LAD gives origin to a large first diagonal.  The first diagonal contains mid segment 30 to 40% narrowing.  The LAD territory is otherwise widely patent.  Circumflex gives origin to 3 obtuse marginals, the second of which is the larger of the branches.  Beyond the origin of the second marginal there is concentric 60 to 70% stenosis unchanged from angiography in 2008.  The third obtuse marginal contains segmental 50% narrowing unchanged from prior angiography.  The right coronary is dominant.  Moderate calcification is noted throughout the  proximal and mid vessel.  Eccentric 50 to 60% mid stenosis is noted.  Diffuse 50 to 60% distal stenosis.  PDA contains 50 to 60% mid stenosis.  Left ventricular function is normal.  EF is estimated to be 60%.  LVEDP is upper normal at 18 mmHg.    EKG:  EKG is ordered today.  The ekg ordered today demonstrates NSR, HR 79  Recent Labs: 03/14/2020: BUN 25; Creatinine, Ser 1.79; Hemoglobin 12.6; Platelets 237; Potassium 4.0; Sodium 139  Recent Lipid Panel    Component  Value Date/Time   CHOL 100 05/03/2018 0235   TRIG 176 (H) 05/03/2018 0235   HDL 27 (L) 05/03/2018 0235   CHOLHDL 3.7 05/03/2018 0235   VLDL 35 05/03/2018 0235   LDLCALC 38 05/03/2018 0235   LDLDIRECT 46.0 10/26/2017 1532    Physical Exam:    VS:  BP 126/64   Pulse 79   Ht 6\' 2"  (1.88 m)   Wt 276 lb 3.2 oz (125.3 kg)   BMI 35.46 kg/m     Wt Readings from Last 3 Encounters:  07/31/20 276 lb 3.2 oz (125.3 kg)  03/28/20 268 lb (121.6 kg)  03/14/20 (!) 260 lb (117.9 kg)     GEN: Well nourished, well developed in no acute distress HEENT: Normal NECK: No JVD; No carotid bruits CARDIAC: RRR, no murmurs, rubs, gallops RESPIRATORY:  Clear to auscultation without rales, wheezing or rhonchi  ABDOMEN: Soft, non-tender, non-distended MUSCULOSKELETAL:  Trace pedal edema; No deformity  SKIN: Warm and dry NEUROLOGIC:  Alert and oriented x 3 PSYCHIATRIC:  Normal affect   ASSESSMENT:    Pre-operative clearance Pt is an acceptable risk for the proposed procedure without further cardiac work up.  OK to hold Warfarin 5 days pre op and aspirin 3-5 days pre op if needed. I will fax clearance to the operating surgeon.   CAD (coronary artery disease), native coronary artery Cath 2008- medical Rx. Cath for chest pain 05/03/18- 30-40% Dx1, 60-70% OM2, 50-60% RCA Normal LVF- normal LVEDP- medical Rx  PAF (paroxysmal atrial fibrillation) (HCC) Atrial flutter Jan 2020- admitted to [facility].  Rx'd with DCCV and RFA. NSR  today  Chronic anticoagulation Warfarin per PCP (cost) as of July 2021- CHADS VASC=4  CRI (chronic renal insufficiency), stage 3 (moderate) (HCC) SCr 1.79 July 2020- PCP following  Hypertensive cardiovascular disease Echo Sept 2019- EF 60-65% with moderate LVH and grade 2 DD  Orthostatic hypotension Resolved after medication adjustment July 2021  PLAN:    I will fax clearance via Epic.  F/U Dr Stanford Breed as scheduled.   Medication Adjustments/Labs and Tests Ordered: Current medicines are reviewed at length with the patient today.  Concerns regarding medicines are outlined above.  Orders Placed This Encounter  Procedures  . EKG 12-Lead   No orders of the defined types were placed in this encounter.   Patient Instructions  Medication Instructions:  Continue current medications  *If you need a refill on your cardiac medications before your next appointment, please call your pharmacy*   Lab Work: None Ordered   Testing/Procedures: None Ordered   Follow-Up: At [facility], you and your health needs are our priority.  As part of our continuing mission to provide you with exceptional heart care, we have created designated Provider Care Teams.  These Care Teams include your primary Cardiologist (physician) and Advanced Practice Providers (APPs -  Physician Assistants and Nurse Practitioners) who all work together to provide you with the care you need, when you need it.  We recommend signing up for the patient portal called "MyChart".  Sign up information is provided on this After Visit Summary.  MyChart is used to connect with patients for Virtual Visits (Telemedicine).  Patients are able to view lab/test results, encounter notes, upcoming appointments, etc.  Non-urgent messages can be sent to your provider as well.   To learn more about what you can do with MyChart, go to [email or web address].    Your next appointment:   Keep appointment on February 8th @ 10:00  am  The  format for your next appointment:   In Person  Provider:   You may see Kirk Ruths, MD or one of the following Advanced Practice Providers on your designated Care Team:    Kerin Ransom, PA-C  [place], [place]  Coletta Memos, FNP        Signed, Kerin Ransom, [place]  07/31/2020 9:43 AM    [place]

## 2020-08-11 DIAGNOSIS — H26492 Other secondary cataract, left eye: Secondary | ICD-10-CM | POA: Diagnosis not present

## 2020-08-11 DIAGNOSIS — H35372 Puckering of macula, left eye: Secondary | ICD-10-CM | POA: Diagnosis not present

## 2020-08-11 DIAGNOSIS — H3581 Retinal edema: Secondary | ICD-10-CM | POA: Diagnosis not present

## 2020-08-13 DIAGNOSIS — R791 Abnormal coagulation profile: Secondary | ICD-10-CM | POA: Diagnosis not present

## 2020-08-19 DIAGNOSIS — H43811 Vitreous degeneration, right eye: Secondary | ICD-10-CM | POA: Diagnosis not present

## 2020-08-19 DIAGNOSIS — H35372 Puckering of macula, left eye: Secondary | ICD-10-CM | POA: Diagnosis not present

## 2020-08-20 DIAGNOSIS — Z7901 Long term (current) use of anticoagulants: Secondary | ICD-10-CM | POA: Diagnosis not present

## 2020-09-03 DIAGNOSIS — D6869 Other thrombophilia: Secondary | ICD-10-CM | POA: Diagnosis not present

## 2020-09-09 DIAGNOSIS — H35373 Puckering of macula, bilateral: Secondary | ICD-10-CM | POA: Diagnosis not present

## 2020-09-10 NOTE — Progress Notes (Signed)
HPI: FUcoronary artery disease. Abdominal ultrasound in August of 2010 showed no aneurysm. Last cardiac catheterization September 2019 showed 30 to 40% first diagonal, 60 to 70% circumflex, 50% obtuse marginal, 50 to 60% RCA, 50 to 60% PDA and ejection fraction 60%. Left ventricular end-diastolic pressure 18. Medical therapy recommended. Echocardiogram September 2019 showed normal LV function, moderate diastolic dysfunction and moderate left ventricular hypertrophy.  PYP scan January 2020 equivocal. Patient was admitted to [facility] January 2020 with atrial flutter.  He had TEE guided cardioversion followed by ablation.  Transthoracic echocardiogram showed normal LV function but was technically difficult.  TEE showed mild LV dysfunction.  Follow-up limited echocardiogram May 2020 technically difficult but LV function felt to be normal.   Patient had atrial fibrillation ablation August 2020.  Since last seen,patient has some dyspnea on exertion but no orthopnea or PND. He has increased pedal edema bilaterally. No chest pain or syncope.  Current Outpatient Medications  Medication Sig Dispense Refill  . Ascorbic Acid (VITAMIN C) 1000 MG tablet Take 1,000 mg by mouth daily.    Marland Kitchen aspirin EC 81 MG tablet Take 81 mg by mouth daily.    Marland Kitchen atorvastatin (LIPITOR) 80 MG tablet Take 80 mg by mouth daily.    . furosemide (LASIX) 40 MG tablet Take 40 mg by mouth daily.    . Insulin Glargine (BASAGLAR KWIKPEN) 100 UNIT/ML Inject 5 Units into the skin daily.    Marland Kitchen losartan (COZAAR) 25 MG tablet TAKE 1 TABLET BY MOUTH EVERYDAY AT BEDTIME 90 tablet 1  . metoprolol tartrate (LOPRESSOR) 50 MG tablet Take 1 tablet (50 mg total) by mouth 2 (two) times daily. 180 tablet 3  . nitroGLYCERIN (NITROSTAT) 0.4 MG SL tablet Place 1 tablet (0.4 mg total) under the tongue every 5 (five) minutes x 3 doses as needed for chest pain. 25 tablet 3  . pantoprazole (PROTONIX) 40 MG tablet Take 1 tablet (40 mg total) by mouth daily. 30  tablet 11  . tiZANidine (ZANAFLEX) 4 MG tablet TAKE 1 TABLET BY MOUTH AT BEDTIME 30 tablet 1  . traMADol (ULTRAM) 50 MG tablet Take 1 tablet (50 mg total) by mouth every 6 (six) hours as needed. 75 tablet 3  . vitamin E 400 UNIT capsule Take 400 Units by mouth daily.    Marland Kitchen warfarin (COUMADIN) 3 MG tablet Take 3 mg by mouth daily.     No current facility-administered medications for this visit.     Past Medical History:  Diagnosis Date  . Arthritis   . CAD (coronary artery disease) 05/03/2018   moderate CAD, normal LVF- medical rx  . Frequent urination   . GERD (gastroesophageal reflux disease)   . Hyperlipidemia   . Hypertension 05/03/2018   normal LVF, moderate LVH, grade 2 DD  . Hypertrophy of prostate with urinary obstruction and other lower urinary tract symptoms (LUTS)   . Low back pain   . Low HDL (under 40)   . Shortness of breath dyspnea    with ambulation  . Skin cancer    removed from face    Past Surgical History:  Procedure Laterality Date  . ANTERIOR LAT LUMBAR FUSION  04/07/2012   Procedure: ANTERIOR LATERAL LUMBAR FUSION 2 LEVELS;  Surgeon: Eustace Moore, MD;  Location: [place] NEURO ORS;  Service: Neurosurgery;  Laterality: Right;  Lumbar three-four and four-five extreme lumbar interbody fusion  . BACK SURGERY    . CARDIAC CATHETERIZATION    . COLONOSCOPY W/ POLYPECTOMY    .  EYE SURGERY     tightened muscles in right eye  . LEFT HEART CATH AND CORONARY ANGIOGRAPHY N/A 05/03/2018   Procedure: LEFT HEART CATH AND CORONARY ANGIOGRAPHY;  Surgeon: Belva Crome, MD;  Location: [place] CV LAB;  Service: Cardiovascular;  Laterality: N/A;  . TONSILLECTOMY      Social History   Socioeconomic History  . Marital status: Married    Spouse name: Not on file  . Number of children: 4  . Years of education: Not on file  . Highest education level: Not on file  Occupational History  . Not on file  Tobacco Use  . Smoking status: Current Some Day Smoker    Types:  Cigarettes    Last attempt to quit: 08/16/2006    Years since quitting: 14.1  . Smokeless tobacco: Never Used  Substance and Sexual Activity  . Alcohol use: Yes    Comment: Rare  . Drug use: No  . Sexual activity: Yes  Other Topics Concern  . Not on file  Social History Narrative  . Not on file   Social Determinants of Health   Financial Resource Strain: Not on file  Food Insecurity: Not on file  Transportation Needs: Not on file  Physical Activity: Not on file  Stress: Not on file  Social Connections: Not on file  Intimate Partner Violence: Not on file    Family History  Problem Relation Age of Onset  . Alcohol abuse Other   . Drug abuse Other   . Arthritis Other   . Hypertension Other     ROS: no fevers or chills, productive cough, hemoptysis, dysphasia, odynophagia, melena, hematochezia, dysuria, hematuria, rash, seizure activity, orthopnea, PND, claudication. Remaining systems are negative.  Physical Exam: Well-developed obese in no acute distress.  Skin is warm and dry.  HEENT is normal.  Neck is supple.  Chest is clear to auscultation with normal expansion.  Cardiovascular exam is regular rate and rhythm.  Abdominal exam nontender or distended. No masses palpated. Extremities show 1+ edema. neuro grossly intact  ECG-normal sinus rhythm at a rate of 89, normal axis, no ST changes. Personally reviewed  A/P  1 paroxysmal atrial fibrillation/flutter-status post ablation. Patient remains in sinus rhythm. Continue Coumadin. INR followed by his primary care physician.  2 history of moderate coronary disease-patient denies chest pain.  Continue statin.    3 hypertension-blood pressure elevated; add amlodipine 5 mg daily. Follow blood pressure and adjust regimen as needed.  4 hyperlipidemia-continue statin.  5 chronic stage III kidney disease-patient is followed by nephrology.  6 chronic diastolic congestive heart failure-he is volume overloaded on examination.  Increase Lasix to 80 mg twice daily. In 1 week check potassium, renal function and BNP. We need to be careful with his diuretic given baseline renal insufficiency.  Kirk Ruths, MD

## 2020-09-11 DIAGNOSIS — Z7901 Long term (current) use of anticoagulants: Secondary | ICD-10-CM | POA: Diagnosis not present

## 2020-09-17 ENCOUNTER — Other Ambulatory Visit: Payer: Self-pay | Admitting: Cardiology

## 2020-09-18 DIAGNOSIS — E1165 Type 2 diabetes mellitus with hyperglycemia: Secondary | ICD-10-CM | POA: Diagnosis not present

## 2020-09-18 DIAGNOSIS — N183 Chronic kidney disease, stage 3 unspecified: Secondary | ICD-10-CM | POA: Diagnosis not present

## 2020-09-18 DIAGNOSIS — I10 Essential (primary) hypertension: Secondary | ICD-10-CM | POA: Diagnosis not present

## 2020-09-18 DIAGNOSIS — I509 Heart failure, unspecified: Secondary | ICD-10-CM | POA: Diagnosis not present

## 2020-09-23 ENCOUNTER — Ambulatory Visit: Payer: Medicare Other | Admitting: Cardiology

## 2020-09-23 ENCOUNTER — Other Ambulatory Visit: Payer: Self-pay

## 2020-09-23 ENCOUNTER — Encounter: Payer: Self-pay | Admitting: Cardiology

## 2020-09-23 VITALS — BP 152/72 | HR 93 | Ht 74.0 in | Wt 282.6 lb

## 2020-09-23 DIAGNOSIS — R0602 Shortness of breath: Secondary | ICD-10-CM

## 2020-09-23 DIAGNOSIS — I2510 Atherosclerotic heart disease of native coronary artery without angina pectoris: Secondary | ICD-10-CM

## 2020-09-23 DIAGNOSIS — I10 Essential (primary) hypertension: Secondary | ICD-10-CM | POA: Diagnosis not present

## 2020-09-23 DIAGNOSIS — I480 Paroxysmal atrial fibrillation: Secondary | ICD-10-CM

## 2020-09-23 MED ORDER — FUROSEMIDE 40 MG PO TABS: 80.0000 mg | ORAL_TABLET | Freq: Two times a day (BID) | ORAL | 3 refills | Status: DC

## 2020-09-23 MED ORDER — AMLODIPINE BESYLATE 5 MG PO TABS: 5.0000 mg | ORAL_TABLET | Freq: Every day | ORAL | 3 refills | Status: DC

## 2020-09-23 NOTE — Patient Instructions (Signed)
Medication Instructions:   START AMLODIPINE 5 MG ONCE DAILY  INCREASE FUROSEMIDE TO 80 MG TWICE DAILY= 2 OF THE 40 MG TABLETS TWICE DAILY  *If you need a refill on your cardiac medications before your next appointment, please call your pharmacy*   Lab Work:  Your physician recommends that you return for lab work in: [place]  If you have labs (blood work) drawn today and your tests are completely normal, you will receive your results only by: Marland Kitchen MyChart Message (if you have MyChart) OR . A paper copy in the mail If you have any lab test that is abnormal or we need to change your treatment, we will call you to review the results.   Follow-Up: At [facility], you and your health needs are our priority.  As part of our continuing mission to provide you with exceptional heart care, we have created designated Provider Care Teams.  These Care Teams include your primary Cardiologist (physician) and Advanced Practice Providers (APPs -  Physician Assistants and Nurse Practitioners) who all work together to provide you with the care you need, when you need it.  We recommend signing up for the patient portal called "MyChart".  Sign up information is provided on this After Visit Summary.  MyChart is used to connect with patients for Virtual Visits (Telemedicine).  Patients are able to view lab/test results, encounter notes, upcoming appointments, etc.  Non-urgent messages can be sent to your provider as well.   To learn more about what you can do with MyChart, go to [email or web address].    Your next appointment:   4 week(s)  The format for your next appointment:   In Person  Provider:   You will see one of the following Advanced Practice Providers on your designated Care Team:    Kerin Ransom, PA-C  [place], [place]  Coletta Memos, [place]  Then, Kirk Ruths, MD will plan to see you again in 3 month(s).

## 2020-09-30 DIAGNOSIS — R0602 Shortness of breath: Secondary | ICD-10-CM | POA: Diagnosis not present

## 2020-09-30 LAB — BASIC METABOLIC PANEL
BUN/Creatinine Ratio: 14 (ref 10–24)
Calcium: 9.4 mg/dL (ref 8.6–10.2)

## 2020-10-01 LAB — BASIC METABOLIC PANEL
BUN: 22 mg/dL (ref 8–27)
CO2: 23 mmol/L (ref 20–29)
Chloride: 101 mmol/L (ref 96–106)
Creatinine, Ser: 1.56 mg/dL — ABNORMAL HIGH (ref 0.76–1.27)
GFR calc Af Amer: 48 mL/min/{1.73_m2} — ABNORMAL LOW (ref >59)
GFR calc non Af Amer: 42 mL/min/{1.73_m2} — ABNORMAL LOW (ref >59)
Glucose: 162 mg/dL — ABNORMAL HIGH (ref 65–99)
Potassium: 4.1 mmol/L (ref 3.5–5.2)
Sodium: 143 mmol/L (ref 134–144)

## 2020-10-01 LAB — PRO B NATRIURETIC PEPTIDE: NT-Pro BNP: 158 pg/mL (ref 0–486)

## 2020-10-02 ENCOUNTER — Encounter: Payer: Self-pay | Admitting: *Deleted

## 2020-10-02 DIAGNOSIS — Z79899 Other long term (current) drug therapy: Secondary | ICD-10-CM | POA: Diagnosis not present

## 2020-10-02 DIAGNOSIS — I25118 Atherosclerotic heart disease of native coronary artery with other forms of angina pectoris: Secondary | ICD-10-CM | POA: Diagnosis not present

## 2020-10-02 DIAGNOSIS — R600 Localized edema: Secondary | ICD-10-CM | POA: Diagnosis not present

## 2020-10-02 DIAGNOSIS — D6869 Other thrombophilia: Secondary | ICD-10-CM | POA: Diagnosis not present

## 2020-10-02 DIAGNOSIS — I10 Essential (primary) hypertension: Secondary | ICD-10-CM | POA: Diagnosis not present

## 2020-10-02 DIAGNOSIS — E1165 Type 2 diabetes mellitus with hyperglycemia: Secondary | ICD-10-CM | POA: Diagnosis not present

## 2020-10-02 DIAGNOSIS — N183 Chronic kidney disease, stage 3 unspecified: Secondary | ICD-10-CM | POA: Diagnosis not present

## 2020-10-02 DIAGNOSIS — G4700 Insomnia, unspecified: Secondary | ICD-10-CM | POA: Diagnosis not present

## 2020-10-02 DIAGNOSIS — E785 Hyperlipidemia, unspecified: Secondary | ICD-10-CM | POA: Diagnosis not present

## 2020-10-02 DIAGNOSIS — M5440 Lumbago with sciatica, unspecified side: Secondary | ICD-10-CM | POA: Diagnosis not present

## 2020-10-02 DIAGNOSIS — I4892 Unspecified atrial flutter: Secondary | ICD-10-CM | POA: Diagnosis not present

## 2020-10-06 ENCOUNTER — Telehealth: Payer: Self-pay

## 2020-10-06 NOTE — Telephone Encounter (Addendum)
Left voice message for patient to give office a call back for results.  ----- Message from Lelon Perla, MD sent at 10/05/2020  9:35 AM EST ----- Renal function unchanged; continue present meds Kirk Ruths

## 2020-10-16 DIAGNOSIS — I4892 Unspecified atrial flutter: Secondary | ICD-10-CM | POA: Diagnosis not present

## 2020-10-16 DIAGNOSIS — Z7901 Long term (current) use of anticoagulants: Secondary | ICD-10-CM | POA: Diagnosis not present

## 2020-10-21 ENCOUNTER — Ambulatory Visit: Payer: Medicare Other | Admitting: Cardiology

## 2020-10-30 DIAGNOSIS — Z7901 Long term (current) use of anticoagulants: Secondary | ICD-10-CM | POA: Diagnosis not present

## 2020-11-13 DIAGNOSIS — M5440 Lumbago with sciatica, unspecified side: Secondary | ICD-10-CM | POA: Diagnosis not present

## 2020-11-13 DIAGNOSIS — G4700 Insomnia, unspecified: Secondary | ICD-10-CM | POA: Diagnosis not present

## 2020-11-13 DIAGNOSIS — D6869 Other thrombophilia: Secondary | ICD-10-CM | POA: Diagnosis not present

## 2020-11-13 DIAGNOSIS — R600 Localized edema: Secondary | ICD-10-CM | POA: Diagnosis not present

## 2020-11-13 DIAGNOSIS — N183 Chronic kidney disease, stage 3 unspecified: Secondary | ICD-10-CM | POA: Diagnosis not present

## 2020-11-13 DIAGNOSIS — E785 Hyperlipidemia, unspecified: Secondary | ICD-10-CM | POA: Diagnosis not present

## 2020-11-13 DIAGNOSIS — I25118 Atherosclerotic heart disease of native coronary artery with other forms of angina pectoris: Secondary | ICD-10-CM | POA: Diagnosis not present

## 2020-11-13 DIAGNOSIS — I4892 Unspecified atrial flutter: Secondary | ICD-10-CM | POA: Diagnosis not present

## 2020-11-13 DIAGNOSIS — E1165 Type 2 diabetes mellitus with hyperglycemia: Secondary | ICD-10-CM | POA: Diagnosis not present

## 2020-11-13 DIAGNOSIS — I10 Essential (primary) hypertension: Secondary | ICD-10-CM | POA: Diagnosis not present

## 2020-12-04 ENCOUNTER — Telehealth: Payer: Self-pay | Admitting: *Deleted

## 2020-12-04 DIAGNOSIS — E1165 Type 2 diabetes mellitus with hyperglycemia: Secondary | ICD-10-CM | POA: Diagnosis not present

## 2020-12-04 DIAGNOSIS — Z7901 Long term (current) use of anticoagulants: Secondary | ICD-10-CM | POA: Diagnosis not present

## 2020-12-04 NOTE — Telephone Encounter (Signed)
Left message for pt to call to discuss symptoms.

## 2020-12-04 NOTE — Telephone Encounter (Signed)
Lelon Perla, MD  Cristopher Estimable, RN Phone Number: [phone]   Schedule APP ov  Kirk Ruths        Previous Messages   ----- Message -----  From: Philmore Pali, NP  Sent: 12/03/2020  2:41 PM EDT  To: Lelon Perla, MD  Subject: worsening dyspnea with exertion          Dear Dr. Stanford Breed,   I have concern over the worsening health status of this patient. Mr. Valentina Lucks is having increased dypnea with just walking across the room. He must sleep in a recliner nightly. I was hoping you could get him in earler than scheduled to assess him.  Best,  Charlott Holler, NP

## 2020-12-08 ENCOUNTER — Encounter: Payer: Self-pay | Admitting: *Deleted

## 2020-12-08 NOTE — Telephone Encounter (Signed)
My chart message sent to patient to call and discuss what is going on.

## 2020-12-17 NOTE — Progress Notes (Deleted)
HPI: FUcoronary artery disease. Abdominal ultrasound in August of 2010 showed no aneurysm. Last cardiac catheterization September 2019 showed 30 to 40% first diagonal, 60 to 70% circumflex, 50% obtuse marginal, 50 to 60% RCA, 50 to 60% PDA and ejection fraction 60%. Left ventricular end-diastolic pressure 18. Medical therapy recommended. Echocardiogram September 2019 showed normal LV function, moderate diastolic dysfunction and moderate left ventricular hypertrophy.PYP scan January 2020 equivocal. Patient was admitted to [facility] January 2020 with atrial flutter. He had TEE guided cardioversion followed by ablation. Transthoracic echocardiogram showed normal LV function but was technically difficult. TEEshowed mild LV dysfunction.  Follow-up limited echocardiogram May 2020 technically difficult but LV function felt to be normal. Patient had atrial fibrillation ablation August 2020.  Since last seen,  Current Outpatient Medications  Medication Sig Dispense Refill  . amLODipine (NORVASC) 5 MG tablet Take 1 tablet (5 mg total) by mouth daily. 90 tablet 3  . Ascorbic Acid (VITAMIN C) 1000 MG tablet Take 1,000 mg by mouth daily.    Marland Kitchen aspirin EC 81 MG tablet Take 81 mg by mouth daily.    Marland Kitchen atorvastatin (LIPITOR) 80 MG tablet Take 80 mg by mouth daily.    . furosemide (LASIX) 40 MG tablet Take 2 tablets (80 mg total) by mouth 2 (two) times daily. 360 tablet 3  . Insulin Glargine (BASAGLAR KWIKPEN) 100 UNIT/ML Inject 5 Units into the skin daily.    Marland Kitchen losartan (COZAAR) 25 MG tablet TAKE 1 TABLET BY MOUTH EVERYDAY AT BEDTIME 90 tablet 1  . metoprolol tartrate (LOPRESSOR) 50 MG tablet Take 1 tablet (50 mg total) by mouth 2 (two) times daily. 180 tablet 3  . nitroGLYCERIN (NITROSTAT) 0.4 MG SL tablet Place 1 tablet (0.4 mg total) under the tongue every 5 (five) minutes x 3 doses as needed for chest pain. 25 tablet 3  . pantoprazole (PROTONIX) 40 MG tablet Take 1 tablet (40 mg total) by mouth daily.  30 tablet 11  . tiZANidine (ZANAFLEX) 4 MG tablet TAKE 1 TABLET BY MOUTH AT BEDTIME 30 tablet 1  . traMADol (ULTRAM) 50 MG tablet Take 1 tablet (50 mg total) by mouth every 6 (six) hours as needed. 75 tablet 3  . vitamin E 400 UNIT capsule Take 400 Units by mouth daily.    Marland Kitchen warfarin (COUMADIN) 3 MG tablet Take 3 mg by mouth daily.     No current facility-administered medications for this visit.     Past Medical History:  Diagnosis Date  . Arthritis   . CAD (coronary artery disease) 05/03/2018   moderate CAD, normal LVF- medical rx  . Frequent urination   . GERD (gastroesophageal reflux disease)   . Hyperlipidemia   . Hypertension 05/03/2018   normal LVF, moderate LVH, grade 2 DD  . Hypertrophy of prostate with urinary obstruction and other lower urinary tract symptoms (LUTS)   . Low back pain   . Low HDL (under 40)   . Shortness of breath dyspnea    with ambulation  . Skin cancer    removed from face    Past Surgical History:  Procedure Laterality Date  . ANTERIOR LAT LUMBAR FUSION  04/07/2012   Procedure: ANTERIOR LATERAL LUMBAR FUSION 2 LEVELS;  Surgeon: Eustace Moore, MD;  Location: [facility];  Service: Neurosurgery;  Laterality: Right;  Lumbar three-four and four-five extreme lumbar interbody fusion  . BACK SURGERY    . CARDIAC CATHETERIZATION    . COLONOSCOPY W/ POLYPECTOMY    .  EYE SURGERY     tightened muscles in right eye  . LEFT HEART CATH AND CORONARY ANGIOGRAPHY N/A 05/03/2018   Procedure: LEFT HEART CATH AND CORONARY ANGIOGRAPHY;  Surgeon: Belva Crome, MD;  Location: [facility];  Service: Cardiovascular;  Laterality: N/A;  . TONSILLECTOMY      Social History   Socioeconomic History  . Marital status: Married    Spouse name: Not on file  . Number of children: 4  . Years of education: Not on file  . Highest education level: Not on file  Occupational History  . Not on file  Tobacco Use  . Smoking status: Current Some Day Smoker    Types:  Cigarettes    Last attempt to quit: 08/16/2006    Years since quitting: 14.3  . Smokeless tobacco: Never Used  Substance and Sexual Activity  . Alcohol use: Yes    Comment: Rare  . Drug use: No  . Sexual activity: Yes  Other Topics Concern  . Not on file  Social History Narrative  . Not on file   Social Determinants of Health   Financial Resource Strain: Not on file  Food Insecurity: Not on file  Transportation Needs: Not on file  Physical Activity: Not on file  Stress: Not on file  Social Connections: Not on file  Intimate Partner Violence: Not on file    Family History  Problem Relation Age of Onset  . Alcohol abuse Other   . Drug abuse Other   . Arthritis Other   . Hypertension Other     ROS: no fevers or chills, productive cough, hemoptysis, dysphasia, odynophagia, melena, hematochezia, dysuria, hematuria, rash, seizure activity, orthopnea, PND, pedal edema, claudication. Remaining systems are negative.  Physical Exam: Well-developed well-nourished in no acute distress.  Skin is warm and dry.  HEENT is normal.  Neck is supple.  Chest is clear to auscultation with normal expansion.  Cardiovascular exam is regular rate and rhythm.  Abdominal exam nontender or distended. No masses palpated. Extremities show no edema. neuro grossly intact  ECG- personally reviewed  A/P  1 paroxysmal atrial fibrillation-patient is status post ablation and remains in sinus rhythm.  Continue Coumadin.  INR monitored by primary care.  2 coronary artery disease-patient denies chest pain.  Continue medical therapy.  Continue statin.  He is not on aspirin given need for Coumadin.  3 chronic diastolic congestive heart failure-volume status has improved with higher dose diuretic.  We will continue.  Check potassium and renal function.  4 hypertension-blood pressure controlled.  Continue present medications.  5 hyperlipidemia-continue statin.  6 chronic stage III kidney disease-patient  is monitored by nephrology.  Kirk Ruths, MD

## 2020-12-26 NOTE — Telephone Encounter (Signed)
Patient has rescheduled his appointment to June as he is out of town,.

## 2020-12-29 ENCOUNTER — Ambulatory Visit: Payer: Medicare Other | Admitting: Cardiology

## 2021-01-02 DIAGNOSIS — Z7901 Long term (current) use of anticoagulants: Secondary | ICD-10-CM | POA: Diagnosis not present

## 2021-01-02 DIAGNOSIS — H6121 Impacted cerumen, right ear: Secondary | ICD-10-CM | POA: Diagnosis not present

## 2021-01-02 DIAGNOSIS — E1165 Type 2 diabetes mellitus with hyperglycemia: Secondary | ICD-10-CM | POA: Diagnosis not present

## 2021-01-28 ENCOUNTER — Ambulatory Visit: Payer: Medicare Other | Admitting: Physician Assistant

## 2021-01-28 ENCOUNTER — Encounter: Payer: Self-pay | Admitting: Physician Assistant

## 2021-01-28 ENCOUNTER — Other Ambulatory Visit: Payer: Self-pay

## 2021-01-28 VITALS — BP 100/56 | HR 104 | Ht 74.0 in | Wt 272.0 lb

## 2021-01-28 DIAGNOSIS — I10 Essential (primary) hypertension: Secondary | ICD-10-CM | POA: Diagnosis not present

## 2021-01-28 DIAGNOSIS — I2510 Atherosclerotic heart disease of native coronary artery without angina pectoris: Secondary | ICD-10-CM

## 2021-01-28 DIAGNOSIS — R0600 Dyspnea, unspecified: Secondary | ICD-10-CM | POA: Diagnosis not present

## 2021-01-28 DIAGNOSIS — E785 Hyperlipidemia, unspecified: Secondary | ICD-10-CM

## 2021-01-28 DIAGNOSIS — I480 Paroxysmal atrial fibrillation: Secondary | ICD-10-CM | POA: Diagnosis not present

## 2021-01-28 DIAGNOSIS — N183 Chronic kidney disease, stage 3 unspecified: Secondary | ICD-10-CM | POA: Diagnosis not present

## 2021-01-28 NOTE — Progress Notes (Signed)
Cardiology Office Note:    Date:  01/30/2021   ID:  Jack Farmer, DOB Apr 14, 1941, MRN [id number]  PCP:  Philmore Pali, NP   [facility] Providers Cardiologist:  Kirk Ruths, MD     Referring MD: Philmore Pali, NP   Chief Complaint  Patient presents with   Follow-up    3 months.   Headache   Shortness of Breath   Edema    History of Present Illness:    Jack Farmer is a 80 y.o. male with a hx of CAD, CKD stage III, atrial flutter and atrial fibrillation s/p ablation 2020, hypertension, hyperlipidemia, and BPH.  Abdominal ultrasound in August 2010 showed no aneurysm.  Cardiac catheterization in September 2019 showed 30 to 40% disease in first diagonal, 60 to 70% disease in left circumflex artery, 30% OM, 50 to 60% RCA, 50 to 60% PDA, EF 60%.  Medical therapy was recommended.  Echocardiogram obtained at that time showed EF normal, moderate diastolic dysfunction, moderate LVH.  PYP scan in January 2020 was equivocal.  He was admitted at [facility] in January 2020 with atrial flutter and underwent TEE DCCV followed by ablation.  TTE showed normal EF but was technically difficult.  TEE showed mild LV dysfunction.  Follow-up limited echocardiogram in May 2020 was technically difficult but EF was found to be normal.  He underwent atrial fibrillation ablation in August 2020.  He was last seen by Dr. Stanford Breed on 09/23/2020 at which time his blood pressure was elevated.  Amlodipine 5 mg was added to his medical regimen.  He was also found to be volume overloaded on exam, Lasix was increased to 80 mg twice a day.  Repeat blood work obtained on 09/30/2020 shows stable renal function after medication adjustment.  We received message from the patient's PCP on 12/04/2020 concerning worsening dyspnea on exertion.  He was supposed to return for earlier follow-up, however since he was out of town, he presented for delayed follow-up today.  The biggest complaint today is increasing dyspnea on exertion  in the past several months.  This has been going on since prior to the last visit.  Increasing the diuretic did help with his lower extremity edema however did not help with the dyspnea on exertion.  He also noticed increased heart rate with minimal amount of exertion.  Most recent lab work obtained by PCP on 01/02/2021 showed stable creatinine of 1.62, potassium 4.0.  Hemoglobin A1c was uncontrolled at 11.2.  He will need better control of his diabetes.  Given his overall symptom I recommended a fasting lipid panel which he is overdue and also TSH given the increase in the heart rate.  I also recommend a repeat echocardiogram.  He denies any chest pain recently, I will hold off on stress test.  I will bring the patient back in 4 to 8 weeks, if the echocardiogram is abnormal, I would try to bring the patient back earlier than that.  Past Medical History:  Diagnosis Date   Arthritis    CAD (coronary artery disease) 05/03/2018   moderate CAD, normal LVF- medical rx   Frequent urination    GERD (gastroesophageal reflux disease)    Hyperlipidemia    Hypertension 05/03/2018   normal LVF, moderate LVH, grade 2 DD   Hypertrophy of prostate with urinary obstruction and other lower urinary tract symptoms (LUTS)    Low back pain    Low HDL (under 40)    Shortness of breath dyspnea  with ambulation   Skin cancer    removed from face    Past Surgical History:  Procedure Laterality Date   ANTERIOR LAT LUMBAR FUSION  04/07/2012   Procedure: ANTERIOR LATERAL LUMBAR FUSION 2 LEVELS;  Surgeon: Eustace Moore, MD;  Location: [facility];  Service: Neurosurgery;  Laterality: Right;  Lumbar three-four and four-five extreme lumbar interbody fusion   BACK SURGERY     CARDIAC CATHETERIZATION     COLONOSCOPY W/ POLYPECTOMY     EYE SURGERY     tightened muscles in right eye   LEFT HEART CATH AND CORONARY ANGIOGRAPHY N/A 05/03/2018   Procedure: LEFT HEART CATH AND CORONARY ANGIOGRAPHY;  Surgeon: Belva Crome,  MD;  Location: [facility];  Service: Cardiovascular;  Laterality: N/A;   TONSILLECTOMY      Current Medications: Current Meds  Medication Sig   amLODipine (NORVASC) 5 MG tablet Take 5 mg by mouth daily.   aspirin EC 81 MG tablet Take 81 mg by mouth daily.   atorvastatin (LIPITOR) 80 MG tablet Take 80 mg by mouth daily.   furosemide (LASIX) 40 MG tablet Take 2 tablets (80 mg total) by mouth 2 (two) times daily.   Insulin Glargine (BASAGLAR KWIKPEN) 100 UNIT/ML Inject 30 Units into the skin daily.   linagliptin (TRADJENTA) 5 MG TABS tablet Take 5 mg by mouth daily.   losartan (COZAAR) 100 MG tablet Take 100 mg by mouth daily.   metoprolol tartrate (LOPRESSOR) 50 MG tablet Take 1 tablet (50 mg total) by mouth 2 (two) times daily.   Multiple Vitamins-Minerals (ONE-A-DAY MENS 50+ PO) Take 1 tablet by mouth daily.   nitroGLYCERIN (NITROSTAT) 0.4 MG SL tablet Place 1 tablet (0.4 mg total) under the tongue every 5 (five) minutes x 3 doses as needed for chest pain.   pantoprazole (PROTONIX) 40 MG tablet Take 1 tablet (40 mg total) by mouth daily.   tiZANidine (ZANAFLEX) 4 MG tablet TAKE 1 TABLET BY MOUTH AT BEDTIME   warfarin (COUMADIN) 3 MG tablet Take 4 mg by mouth daily.   [DISCONTINUED] amLODipine (NORVASC) 5 MG tablet Take 1 tablet (5 mg total) by mouth daily.   [DISCONTINUED] Ascorbic Acid (VITAMIN C) 1000 MG tablet Take 1,000 mg by mouth daily.   [DISCONTINUED] losartan (COZAAR) 25 MG tablet TAKE 1 TABLET BY MOUTH EVERYDAY AT BEDTIME   [DISCONTINUED] traMADol (ULTRAM) 50 MG tablet Take 1 tablet (50 mg total) by mouth every 6 (six) hours as needed.   [DISCONTINUED] vitamin E 400 UNIT capsule Take 400 Units by mouth daily.     Allergies:   Cephalexin and Indocin [indomethacin]   Social History   Socioeconomic History   Marital status: Married    Spouse name: Not on file   Number of children: 4   Years of education: Not on file   Highest education level: Not on file   Occupational History   Not on file  Tobacco Use   Smoking status: Some Days    Pack years: 0.00    Types: Cigarettes    Last attempt to quit: 08/16/2006    Years since quitting: 14.4   Smokeless tobacco: Never  Substance and Sexual Activity   Alcohol use: Yes    Comment: Rare   Drug use: No   Sexual activity: Yes  Other Topics Concern   Not on file  Social History Narrative   Not on file   Social Determinants of Health   Financial Resource Strain: Not on  file  Food Insecurity: Not on file  Transportation Needs: Not on file  Physical Activity: Not on file  Stress: Not on file  Social Connections: Not on file     Family History: The patient's family history includes Alcohol abuse in an other family member; Arthritis in an other family member; Drug abuse in an other family member; Hypertension in an other family member.  ROS:   Please see the history of present illness.     All other systems reviewed and are negative.  EKGs/Labs/Other Studies Reviewed:    The following studies were reviewed today:  Echo 05/03/2018 LV EF: 60% -   65%   Study Conclusions   - Left ventricle: The cavity size was normal. Wall thickness was    increased in a pattern of moderate LVH. Systolic function was    normal. The estimated ejection fraction was in the range of 60%    to 65%. Wall motion was normal; there were no regional wall    motion abnormalities. Features are consistent with a pseudonormal    left ventricular filling pattern, with concomitant abnormal    relaxation and increased filling pressure (grade 2 diastolic    dysfunction).   EKG:  EKG is ordered today.  The ekg ordered today demonstrates sinus tachycardia, no significant ST-T wave changes  Recent Labs: 03/14/2020: Hemoglobin 12.6; Platelets 237 09/30/2020: BUN 22; Creatinine, Ser 1.56; NT-Pro BNP 158; Potassium 4.1; Sodium 143  Recent Lipid Panel    Component Value Date/Time   CHOL 100 05/03/2018 0235   TRIG 176  (H) 05/03/2018 0235   HDL 27 (L) 05/03/2018 0235   CHOLHDL 3.7 05/03/2018 0235   VLDL 35 05/03/2018 0235   LDLCALC 38 05/03/2018 0235   LDLDIRECT 46.0 10/26/2017 1532     Risk Assessment/Calculations:    CHA2DS2-VASc Score = 5  This indicates a 7.2% annual risk of stroke. The patient's score is based upon: CHF History: No HTN History: Yes Diabetes History: Yes Stroke History: No Vascular Disease History: Yes Age Score: 2 Gender Score: 0      Physical Exam:    VS:  BP (!) 100/56 (BP Location: Left Arm, Patient Position: Sitting, Cuff Size: Large)   Pulse (!) 104   Ht 6\' 2"  (1.88 m)   Wt 272 lb (123.4 kg)   BMI 34.92 kg/m     Wt Readings from Last 3 Encounters:  01/28/21 272 lb (123.4 kg)  09/23/20 282 lb 9.6 oz (128.2 kg)  07/31/20 276 lb 3.2 oz (125.3 kg)     GEN:  Well nourished, well developed in no acute distress HEENT: Normal NECK: No JVD; No carotid bruits LYMPHATICS: No lymphadenopathy CARDIAC: RRR, no murmurs, rubs, gallops RESPIRATORY:  Clear to auscultation without rales, wheezing or rhonchi  ABDOMEN: Soft, non-tender, non-distended MUSCULOSKELETAL:  No edema; No deformity  SKIN: Warm and dry NEUROLOGIC:  Alert and oriented x 3 PSYCHIATRIC:  Normal affect   ASSESSMENT:    1. Dyspnea, unspecified type   2. Hyperlipidemia with target LDL less than 70   3. Coronary artery disease involving native coronary artery of native heart without angina pectoris   4. Paroxysmal atrial fibrillation (HCC)   5. Stage 3 chronic kidney disease, unspecified whether stage 3a or 3b CKD ([place])   6. Essential hypertension    PLAN:    In order of problems listed above:  Dyspnea: Patient does not appear to be significantly volume overloaded based on exam.  EKG showed mild sinus tachycardia.  I recommended TSH and a lipid panel.  Recent basic metabolic panel showed stable renal function.  Obtain echocardiogram to assess ejection fraction.  Hyperlipidemia: Obtain  fasting lipid panel.  On Lipitor  CAD: Denies any recent chest pain.  Given lack of chest discomfort, I will hold off on ischemic work-up at this time unless echocardiogram come back abnormal  PAF: Underwent ablation at [facility].  No sign of recurrence  CKD stage III: Lab work shows stable renal function recently  Hypertension: Blood pressure stable.        Medication Adjustments/Labs and Tests Ordered: Current medicines are reviewed at length with the patient today.  Concerns regarding medicines are outlined above.  Orders Placed This Encounter  Procedures   Lipid panel   TSH   EKG 12-Lead   ECHOCARDIOGRAM COMPLETE   No orders of the defined types were placed in this encounter.   Patient Instructions  Medication Instructions:  Your physician recommends that you continue on your current medications as directed. Please refer to the Current Medication list given to you today.  *If you need a refill on your cardiac medications before your next appointment, please call your pharmacy*   Lab Work: Your physician recommends that you return for lab work in [place] 1 WEEK:  Fasting Lipid Panel-DO NOT EAT OR DRINK PAST MIDNIGHT. OKAY TO HAVE WATER. TSH If you have labs (blood work) drawn today and your tests are completely normal, you will receive your results only by: [place] (if you have MyChart) OR A paper copy in the mail If you have any lab test that is abnormal or we need to change your treatment, we will call you to review the results.  Testing/Procedures: Your physician has requested that you have an echocardiogram. Echocardiography is a painless test that uses sound waves to create images of your heart. It provides your doctor with information about the size and shape of your heart and how well your heart's chambers and valves are working. This procedure takes approximately one hour. There are no restrictions for this procedure. This test is performed at [address], [place] [address]  PLEASE SCHEDULE FOR 4-6 WEEKS   Follow-Up: At [facility], you and your health needs are our priority.  As part of our continuing mission to provide you with exceptional heart care, we have created designated Provider Care Teams.  These Care Teams include your primary Cardiologist (physician) and Advanced Practice Providers (APPs -  Physician Assistants and Nurse Practitioners) who all work together to provide you with the care you need, when you need it.  Your next appointment:   4-8 week(s)  The format for your next appointment:   In Person  Provider:   Kirk Ruths, MD or Almyra Deforest, PA-C  Other Instructions   Signed, Almyra Deforest, [place]  01/30/2021 10:36 PM    [place]

## 2021-01-28 NOTE — Patient Instructions (Signed)
Medication Instructions:  Your physician recommends that you continue on your current medications as directed. Please refer to the Current Medication list given to you today.  *If you need a refill on your cardiac medications before your next appointment, please call your pharmacy*   Lab Work: Your physician recommends that you return for lab work in [place] 1 WEEK:  Fasting Lipid Panel-DO NOT EAT OR DRINK PAST MIDNIGHT. OKAY TO HAVE WATER. TSH If you have labs (blood work) drawn today and your tests are completely normal, you will receive your results only by: [place] (if you have MyChart) OR A paper copy in the mail If you have any lab test that is abnormal or we need to change your treatment, we will call you to review the results.  Testing/Procedures: Your physician has requested that you have an echocardiogram. Echocardiography is a painless test that uses sound waves to create images of your heart. It provides your doctor with information about the size and shape of your heart and how well your heart's chambers and valves are working. This procedure takes approximately one hour. There are no restrictions for this procedure. This test is performed at [address], [place] [address]  PLEASE SCHEDULE FOR 4-6 WEEKS   Follow-Up: At [facility], you and your health needs are our priority.  As part of our continuing mission to provide you with exceptional heart care, we have created designated Provider Care Teams.  These Care Teams include your primary Cardiologist (physician) and Advanced Practice Providers (APPs -  Physician Assistants and Nurse Practitioners) who all work together to provide you with the care you need, when you need it.  Your next appointment:   4-8 week(s)  The format for your next appointment:   In Person  Provider:   Kirk Ruths, MD or Almyra Deforest, PA-C  Other Instructions

## 2021-02-02 DIAGNOSIS — R0600 Dyspnea, unspecified: Secondary | ICD-10-CM | POA: Diagnosis not present

## 2021-02-02 DIAGNOSIS — E785 Hyperlipidemia, unspecified: Secondary | ICD-10-CM | POA: Diagnosis not present

## 2021-02-02 LAB — LIPID PANEL
Chol/HDL Ratio: 4.2 ratio (ref 0.0–5.0)
Cholesterol, Total: 104 mg/dL (ref 100–199)
HDL: 25 mg/dL — ABNORMAL LOW (ref >39)
LDL Chol Calc (NIH): 32 mg/dL (ref 0–99)
Triglycerides: 313 mg/dL — ABNORMAL HIGH (ref 0–149)
VLDL Cholesterol Cal: 47 mg/dL — ABNORMAL HIGH (ref 5–40)

## 2021-02-02 LAB — TSH: TSH: 1.42 u[IU]/mL (ref 0.450–4.500)

## 2021-02-06 ENCOUNTER — Other Ambulatory Visit: Payer: Self-pay

## 2021-02-06 MED ORDER — OMEGA-3-ACID ETHYL ESTERS 1 G PO CAPS: 1.0000 g | ORAL_CAPSULE | Freq: Two times a day (BID) | ORAL | 3 refills | Status: AC

## 2021-02-12 DIAGNOSIS — E1165 Type 2 diabetes mellitus with hyperglycemia: Secondary | ICD-10-CM | POA: Diagnosis not present

## 2021-02-12 DIAGNOSIS — R600 Localized edema: Secondary | ICD-10-CM | POA: Diagnosis not present

## 2021-02-12 DIAGNOSIS — I10 Essential (primary) hypertension: Secondary | ICD-10-CM | POA: Diagnosis not present

## 2021-02-12 DIAGNOSIS — E785 Hyperlipidemia, unspecified: Secondary | ICD-10-CM | POA: Diagnosis not present

## 2021-02-12 DIAGNOSIS — I4892 Unspecified atrial flutter: Secondary | ICD-10-CM | POA: Diagnosis not present

## 2021-02-12 DIAGNOSIS — I25118 Atherosclerotic heart disease of native coronary artery with other forms of angina pectoris: Secondary | ICD-10-CM | POA: Diagnosis not present

## 2021-02-12 DIAGNOSIS — Z7901 Long term (current) use of anticoagulants: Secondary | ICD-10-CM | POA: Diagnosis not present

## 2021-02-12 DIAGNOSIS — M5440 Lumbago with sciatica, unspecified side: Secondary | ICD-10-CM | POA: Diagnosis not present

## 2021-02-12 DIAGNOSIS — D6869 Other thrombophilia: Secondary | ICD-10-CM | POA: Diagnosis not present

## 2021-02-12 DIAGNOSIS — G4700 Insomnia, unspecified: Secondary | ICD-10-CM | POA: Diagnosis not present

## 2021-02-12 DIAGNOSIS — N183 Chronic kidney disease, stage 3 unspecified: Secondary | ICD-10-CM | POA: Diagnosis not present

## 2021-02-26 ENCOUNTER — Other Ambulatory Visit: Payer: Self-pay

## 2021-02-26 MED ORDER — PANTOPRAZOLE SODIUM 40 MG PO TBEC: 40.0000 mg | DELAYED_RELEASE_TABLET | Freq: Every day | ORAL | 11 refills | Status: DC

## 2021-03-11 ENCOUNTER — Ambulatory Visit (HOSPITAL_COMMUNITY): Payer: Medicare Other | Attending: Physician Assistant

## 2021-03-11 ENCOUNTER — Other Ambulatory Visit: Payer: Self-pay

## 2021-03-11 VITALS — BP 166/84

## 2021-03-11 DIAGNOSIS — R0600 Dyspnea, unspecified: Secondary | ICD-10-CM | POA: Diagnosis not present

## 2021-03-11 DIAGNOSIS — I4891 Unspecified atrial fibrillation: Secondary | ICD-10-CM | POA: Insufficient documentation

## 2021-03-11 LAB — ECHOCARDIOGRAM COMPLETE
Area-P 1/2: 3.74 cm2
S' Lateral: 3.2 cm

## 2021-03-11 MED ORDER — PERFLUTREN LIPID MICROSPHERE
1.0000 mL | INTRAVENOUS | Status: AC | PRN
  Administered 2021-03-11: 1 mL via INTRAVENOUS

## 2021-03-11 NOTE — Progress Notes (Signed)
Normal pumping function of heart, right chamber pressure is normal, no sign of volume overload, no significant valve issue. Overall essentially normal echo with no finding to explain shortness of breath.

## 2021-03-20 ENCOUNTER — Ambulatory Visit: Payer: Medicare Other | Admitting: Physician Assistant

## 2021-03-20 ENCOUNTER — Other Ambulatory Visit: Payer: Self-pay

## 2021-03-20 ENCOUNTER — Encounter: Payer: Self-pay | Admitting: Physician Assistant

## 2021-03-20 VITALS — BP 140/78 | HR 86 | Ht 75.0 in | Wt 268.2 lb

## 2021-03-20 DIAGNOSIS — I10 Essential (primary) hypertension: Secondary | ICD-10-CM | POA: Diagnosis not present

## 2021-03-20 DIAGNOSIS — I4891 Unspecified atrial fibrillation: Secondary | ICD-10-CM

## 2021-03-20 DIAGNOSIS — I2510 Atherosclerotic heart disease of native coronary artery without angina pectoris: Secondary | ICD-10-CM

## 2021-03-20 DIAGNOSIS — E785 Hyperlipidemia, unspecified: Secondary | ICD-10-CM | POA: Diagnosis not present

## 2021-03-20 NOTE — Progress Notes (Signed)
Cardiology Office Note:    Date:  03/22/2021   ID:  Jack Farmer, DOB [birth year], MRN [id number]  PCP:  Jack Pali, NP   [facility] Providers Cardiologist:  Jack Ruths, MD     Referring MD: Jack Pali, NP   Chief Complaint  Patient presents with   Follow-up    Seen for Dr. Stanford Farmer    History of Present Illness:    Jack Farmer is a 80 y.o. male with a hx of CAD, CKD stage III, atrial flutter and atrial fibrillation s/p ablation 2020, hypertension, hyperlipidemia, and BPH.  Abdominal ultrasound in August 2010 showed no aneurysm.  Cardiac catheterization in September 2019 showed 30 to 40% disease in first diagonal, 60 to 70% disease in left circumflex artery, 30% OM, 50 to 60% RCA, 50 to 60% PDA, EF 60%.  Medical therapy was recommended.  Echocardiogram obtained at that time showed EF normal, moderate diastolic dysfunction, moderate LVH.  PYP scan in January 2020 was equivocal.  He was admitted at [facility] in January 2020 with atrial flutter and underwent TEE DCCV followed by ablation.  TTE showed normal EF but was technically difficult.  TEE showed mild LV dysfunction.  Follow-up limited echocardiogram in May 2020 was technically difficult but EF was found to be normal.  He underwent atrial fibrillation ablation in August 2020.   He was last seen by Dr. Stanford Farmer on 09/23/2020 at which time his blood pressure was elevated.  Amlodipine 5 mg was added to his medical regimen.  He was also found to be volume overloaded on exam, Lasix was increased to 80 mg twice a day.  Repeat blood work obtained on 09/30/2020 shows stable renal function after medication adjustment.  We received message from the patient's PCP on 12/04/2020 concerning worsening dyspnea on exertion.  I last saw the patient on 01/28/2021 at which time he complained of increasing dyspnea on exertion.  The increased diuretic did help with lower extremity edema however did not help with the dyspnea on exertion.  He also  noticed increased heart rate with minimal amount of exertion.  Lab work obtained by PCP showed stable renal function.  I recommend a repeat echocardiogram that shows normal EF.  TSH was normal.  Patient presents today for follow-up.  His shortness of breath has resolved.  He appears to be euvolemic on today's exam.  He denies any chest pain.  I reviewed the recent echocardiogram results with him.  He is not yet a very cheerful mood today.  He mentions he met a woman at the church about a month ago and they are getting married in 30 days.  He just returned from [place] after visiting the woman's family.  He is doing well from the cardiac perspective and can follow-up with Dr. Stanford Farmer in 5 to 6 months.   Past Medical History:  Diagnosis Date   Arthritis    CAD (coronary artery disease) 05/03/2018   moderate CAD, normal LVF- medical rx   Frequent urination    GERD (gastroesophageal reflux disease)    Hyperlipidemia    Hypertension 05/03/2018   normal LVF, moderate LVH, grade 2 DD   Hypertrophy of prostate with urinary obstruction and other lower urinary tract symptoms (LUTS)    Low back pain    Low HDL (under 40)    Shortness of breath dyspnea    with ambulation   Skin cancer    removed from face    Past Surgical  History:  Procedure Laterality Date   ANTERIOR LAT LUMBAR FUSION  04/07/2012   Procedure: ANTERIOR LATERAL LUMBAR FUSION 2 LEVELS;  Surgeon: David S Jones, MD;  Location: [facility];  Service: Neurosurgery;  Laterality: Right;  Lumbar three-four and four-five extreme lumbar interbody fusion   BACK SURGERY     CARDIAC CATHETERIZATION     COLONOSCOPY W/ POLYPECTOMY     EYE SURGERY     tightened muscles in right eye   LEFT HEART CATH AND CORONARY ANGIOGRAPHY N/A 05/03/2018   Procedure: LEFT HEART CATH AND CORONARY ANGIOGRAPHY;  Surgeon: Smith, Henry W, MD;  Location: [facility];  Service: Cardiovascular;  Laterality: N/A;   TONSILLECTOMY      Current  Medications: Current Meds  Medication Sig   amLODipine (NORVASC) 5 MG tablet Take 5 mg by mouth daily.   aspirin EC 81 MG tablet Take 81 mg by mouth daily.   atorvastatin (LIPITOR) 80 MG tablet Take 80 mg by mouth daily.   furosemide (LASIX) 40 MG tablet Take 2 tablets (80 mg total) by mouth 2 (two) times daily.   Insulin Glargine (BASAGLAR KWIKPEN) 100 UNIT/ML Inject 30 Units into the skin daily.   linagliptin (TRADJENTA) 5 MG TABS tablet Take 5 mg by mouth daily.   losartan (COZAAR) 100 MG tablet Take 100 mg by mouth daily.   metoprolol tartrate (LOPRESSOR) 50 MG tablet Take 1 tablet (50 mg total) by mouth 2 (two) times daily.   Multiple Vitamins-Minerals (ONE-A-DAY MENS 50+ PO) Take 1 tablet by mouth daily.   nitroGLYCERIN (NITROSTAT) 0.4 MG SL tablet Place 1 tablet (0.4 mg total) under the tongue every 5 (five) minutes x 3 doses as needed for chest pain.   omega-3 acid ethyl esters (LOVAZA) 1 g capsule Take 1 capsule (1 g total) by mouth 2 (two) times daily.   pantoprazole (PROTONIX) 40 MG tablet Take 1 tablet (40 mg total) by mouth daily.   tiZANidine (ZANAFLEX) 4 MG tablet TAKE 1 TABLET BY MOUTH AT BEDTIME   warfarin (COUMADIN) 3 MG tablet Take 4 mg by mouth daily.     Allergies:   Cephalexin and Indocin [indomethacin]   Social History   Socioeconomic History   Marital status: Married    Spouse name: Not on file   Number of children: 4   Years of education: Not on file   Highest education level: Not on file  Occupational History   Not on file  Tobacco Use   Smoking status: Some Days    Types: Cigarettes    Last attempt to quit: 08/16/2006    Years since quitting: 14.6   Smokeless tobacco: Never  Substance and Sexual Activity   Alcohol use: Yes    Comment: Rare   Drug use: No   Sexual activity: Yes  Other Topics Concern   Not on file  Social History Narrative   Not on file   Social Determinants of Health   Financial Resource Strain: Not on file  Food Insecurity:  Not on file  Transportation Needs: Not on file  Physical Activity: Not on file  Stress: Not on file  Social Connections: Not on file     Family History: The patient's family history includes Alcohol abuse in an other family member; Arthritis in an other family member; Drug abuse in an other family member; Hypertension in an other family member.  ROS:   Please see the history of present illness.     All other systems reviewed   and are negative.  EKGs/Labs/Other Studies Reviewed:    The following studies were reviewed today:  Echo 03/11/2021  1. Left ventricular ejection fraction, by estimation, is 60 to 65%. The  left ventricle has normal function. The left ventricle has no regional  wall motion abnormalities. There is mild concentric left ventricular  hypertrophy. Left ventricular diastolic  parameters are consistent with Grade I diastolic dysfunction (impaired  relaxation).   2. Right ventricular systolic function is normal. The right ventricular  size is normal. There is normal pulmonary artery systolic pressure.   3. The mitral valve is normal in structure. No evidence of mitral valve  regurgitation. No evidence of mitral stenosis.   4. The aortic valve is tricuspid. There is moderate calcification of the  aortic valve. Aortic valve regurgitation is not visualized. Mild aortic  valve sclerosis is present, with no evidence of aortic valve stenosis.   5. The inferior vena cava is normal in size with greater than 50%  respiratory variability, suggesting right atrial pressure of 3 mmHg.   Comparison(s): A prior study was performed on 05/03/2018. No significant  change from prior study.   EKG:  EKG is ordered today.  The ekg ordered today demonstrates normal sinus rhythm, no significant ST-T wave changes  Recent Labs: 09/30/2020: BUN 22; Creatinine, Ser 1.56; NT-Pro BNP 158; Potassium 4.1; Sodium 143 02/02/2021: TSH 1.420  Recent Lipid Panel    Component Value Date/Time   CHOL  104 02/02/2021 0814   TRIG 313 (H) 02/02/2021 0814   HDL 25 (L) 02/02/2021 0814   CHOLHDL 4.2 02/02/2021 0814   CHOLHDL 3.7 05/03/2018 0235   VLDL 35 05/03/2018 0235   LDLCALC 32 02/02/2021 0814   LDLDIRECT 46.0 10/26/2017 1532     Risk Assessment/Calculations:    CHA2DS2-VASc Score = 5  This indicates a 7.2% annual risk of stroke. The patient's score is based upon: CHF History: No HTN History: Yes Diabetes History: Yes Stroke History: No Vascular Disease History: Yes Age Score: 2 Gender Score: 0          Physical Exam:    VS:  BP 140/78   Pulse 86   Ht 6' 3" (1.905 m)   Wt 268 lb 3.2 oz (121.7 kg)   SpO2 94%   BMI 33.52 kg/m     Wt Readings from Last 3 Encounters:  03/20/21 268 lb 3.2 oz (121.7 kg)  01/28/21 272 lb (123.4 kg)  09/23/20 282 lb 9.6 oz (128.2 kg)     GEN:  Well nourished, well developed in no acute distress HEENT: Normal NECK: No JVD; No carotid bruits LYMPHATICS: No lymphadenopathy CARDIAC: RRR, no murmurs, rubs, gallops RESPIRATORY:  Clear to auscultation without rales, wheezing or rhonchi  ABDOMEN: Soft, non-tender, non-distended MUSCULOSKELETAL:  No edema; No deformity  SKIN: Warm and dry NEUROLOGIC:  Alert and oriented x 3 PSYCHIATRIC:  Normal affect   ASSESSMENT:    1. Atrial fibrillation, unspecified type ([place])   2. Coronary artery disease involving native coronary artery of native heart without angina pectoris   3. Essential hypertension   4. Hyperlipidemia with target LDL less than 70    PLAN:    In order of problems listed above:  History of atrial fibrillation/atrial flutter: Status post ablation at [facility].  No obvious recurrence.  Heart rate very well controlled.  CAD: Denies any recent chest discomfort.  Recent echocardiogram was reassuring, EF normal.  Hypertension: Blood pressure stable  Hyperlipidemia: On Lipitor  Medication Adjustments/Labs and Tests Ordered: Current medicines are reviewed at length  with the patient today.  Concerns regarding medicines are outlined above.  Orders Placed This Encounter  Procedures   EKG 12-Lead   No orders of the defined types were placed in this encounter.   Patient Instructions  Medication Instructions:  Your physician recommends that you continue on your current medications as directed. Please refer to the Current Medication list given to you today.  *If you need a refill on your cardiac medications before your next appointment, please call your pharmacy*  Lab Work: NONE ordered at this time of appointment   If you have labs (blood work) drawn today and your tests are completely normal, you will receive your results only by: MyChart Message (if you have MyChart) OR A paper copy in the mail If you have any lab test that is abnormal or we need to change your treatment, we will call you to review the results.  Testing/Procedures: NONE ordered at this time of appointment   Follow-Up: At [facility], you and your health needs are our priority.  As part of our continuing mission to provide you with exceptional heart care, we have created designated Provider Care Teams.  These Care Teams include your primary Cardiologist (physician) and Advanced Practice Providers (APPs -  Physician Assistants and Nurse Practitioners) who all work together to provide you with the care you need, when you need it.  Your next appointment:   6 month(s)  The format for your next appointment:   In Person  Provider:   Brian Crenshaw, MD   Other Instructions    Signed,  , PA  03/22/2021 11:23 PM    [facility]  

## 2021-03-20 NOTE — Patient Instructions (Addendum)
Medication Instructions:  Your physician recommends that you continue on your current medications as directed. Please refer to the Current Medication list given to you today.  *If you need a refill on your cardiac medications before your next appointment, please call your pharmacy*  Lab Work: NONE ordered at this time of appointment   If you have labs (blood work) drawn today and your tests are completely normal, you will receive your results only by: [place] (if you have MyChart) OR A paper copy in the mail If you have any lab test that is abnormal or we need to change your treatment, we will call you to review the results.  Testing/Procedures: NONE ordered at this time of appointment   Follow-Up: At [facility], you and your health needs are our priority.  As part of our continuing mission to provide you with exceptional heart care, we have created designated Provider Care Teams.  These Care Teams include your primary Cardiologist (physician) and Advanced Practice Providers (APPs -  Physician Assistants and Nurse Practitioners) who all work together to provide you with the care you need, when you need it.  Your next appointment:   6 month(s)  The format for your next appointment:   In Person  Provider:   Kirk Ruths, MD   Other Instructions

## 2021-03-22 ENCOUNTER — Encounter: Payer: Self-pay | Admitting: Physician Assistant

## 2021-03-30 DIAGNOSIS — D6869 Other thrombophilia: Secondary | ICD-10-CM | POA: Diagnosis not present

## 2021-03-30 DIAGNOSIS — I10 Essential (primary) hypertension: Secondary | ICD-10-CM | POA: Diagnosis not present

## 2021-03-30 DIAGNOSIS — I25118 Atherosclerotic heart disease of native coronary artery with other forms of angina pectoris: Secondary | ICD-10-CM | POA: Diagnosis not present

## 2021-03-30 DIAGNOSIS — N183 Chronic kidney disease, stage 3 unspecified: Secondary | ICD-10-CM | POA: Diagnosis not present

## 2021-03-30 DIAGNOSIS — E785 Hyperlipidemia, unspecified: Secondary | ICD-10-CM | POA: Diagnosis not present

## 2021-03-30 DIAGNOSIS — E1165 Type 2 diabetes mellitus with hyperglycemia: Secondary | ICD-10-CM | POA: Diagnosis not present

## 2021-03-30 DIAGNOSIS — I4892 Unspecified atrial flutter: Secondary | ICD-10-CM | POA: Diagnosis not present

## 2021-03-30 DIAGNOSIS — R600 Localized edema: Secondary | ICD-10-CM | POA: Diagnosis not present

## 2021-03-30 DIAGNOSIS — Z139 Encounter for screening, unspecified: Secondary | ICD-10-CM | POA: Diagnosis not present

## 2021-03-30 DIAGNOSIS — G4700 Insomnia, unspecified: Secondary | ICD-10-CM | POA: Diagnosis not present

## 2021-03-30 DIAGNOSIS — R791 Abnormal coagulation profile: Secondary | ICD-10-CM | POA: Diagnosis not present

## 2021-03-30 DIAGNOSIS — M5440 Lumbago with sciatica, unspecified side: Secondary | ICD-10-CM | POA: Diagnosis not present

## 2021-04-01 ENCOUNTER — Other Ambulatory Visit: Payer: Self-pay | Admitting: Endocrinology

## 2021-04-01 ENCOUNTER — Ambulatory Visit (INDEPENDENT_AMBULATORY_CARE_PROVIDER_SITE_OTHER): Payer: Medicare Other | Admitting: Endocrinology

## 2021-04-01 ENCOUNTER — Encounter: Payer: Self-pay | Admitting: Endocrinology

## 2021-04-01 ENCOUNTER — Other Ambulatory Visit: Payer: Self-pay

## 2021-04-01 VITALS — BP 150/79 | HR 100 | Ht 75.0 in | Wt 264.2 lb

## 2021-04-01 DIAGNOSIS — N183 Chronic kidney disease, stage 3 unspecified: Secondary | ICD-10-CM

## 2021-04-01 DIAGNOSIS — E1151 Type 2 diabetes mellitus with diabetic peripheral angiopathy without gangrene: Secondary | ICD-10-CM | POA: Diagnosis not present

## 2021-04-01 DIAGNOSIS — E1159 Type 2 diabetes mellitus with other circulatory complications: Secondary | ICD-10-CM | POA: Diagnosis not present

## 2021-04-01 DIAGNOSIS — E1165 Type 2 diabetes mellitus with hyperglycemia: Secondary | ICD-10-CM | POA: Diagnosis not present

## 2021-04-01 DIAGNOSIS — E119 Type 2 diabetes mellitus without complications: Secondary | ICD-10-CM

## 2021-04-01 DIAGNOSIS — E1140 Type 2 diabetes mellitus with diabetic neuropathy, unspecified: Secondary | ICD-10-CM | POA: Diagnosis not present

## 2021-04-01 DIAGNOSIS — E1122 Type 2 diabetes mellitus with diabetic chronic kidney disease: Secondary | ICD-10-CM | POA: Diagnosis not present

## 2021-04-01 DIAGNOSIS — E118 Type 2 diabetes mellitus with unspecified complications: Secondary | ICD-10-CM

## 2021-04-01 LAB — POCT GLYCOSYLATED HEMOGLOBIN (HGB A1C): Hemoglobin A1C: 10.4 % — AB (ref 4.0–5.6)

## 2021-04-01 MED ORDER — TIRZEPATIDE 2.5 MG/0.5ML ~~LOC~~ SOAJ: 2.5000 mg | SUBCUTANEOUS | 11 refills | Status: DC

## 2021-04-01 MED ORDER — LANTUS SOLOSTAR 100 UNIT/ML ~~LOC~~ SOPN: 30.0000 [IU] | PEN_INJECTOR | SUBCUTANEOUS | 99 refills | Status: DC

## 2021-04-01 NOTE — Progress Notes (Signed)
Subjective:    Patient ID: Jack Farmer, male    DOB: Jan 01, 1941, 80 y.o.   MRN: [id number]  HPI pt is referred by Charlott Holler, NP, for diabetes.  Pt states DM was dx'ed in [id number]; it is complicated by PN, CAD, PAD, and stage 3 CRI.  he has been on insulin since soon after dx; pt says his diet and exercise are good; he has never had pancreatitis, pancreatic surgery, severe hypoglycemia or DKA.  He has lost 16 lbs x 1 year--intentional.  He takes basaglar 30/d, and tradjenta.  He says cbg's are in the high-100's.   Past Medical History:  Diagnosis Date   Arthritis    CAD (coronary artery disease) 05/03/2018   moderate CAD, normal LVF- medical rx   Frequent urination    GERD (gastroesophageal reflux disease)    Hyperlipidemia    Hypertension 05/03/2018   normal LVF, moderate LVH, grade 2 DD   Hypertrophy of prostate with urinary obstruction and other lower urinary tract symptoms (LUTS)    Low back pain    Low HDL (under 40)    Shortness of breath dyspnea    with ambulation   Skin cancer    removed from face    Past Surgical History:  Procedure Laterality Date   ANTERIOR LAT LUMBAR FUSION  04/07/2012   Procedure: ANTERIOR LATERAL LUMBAR FUSION 2 LEVELS;  Surgeon: Eustace Moore, MD;  Location: [facility];  Service: Neurosurgery;  Laterality: Right;  Lumbar three-four and four-five extreme lumbar interbody fusion   BACK SURGERY     CARDIAC CATHETERIZATION     COLONOSCOPY W/ POLYPECTOMY     EYE SURGERY     tightened muscles in right eye   LEFT HEART CATH AND CORONARY ANGIOGRAPHY N/A 05/03/2018   Procedure: LEFT HEART CATH AND CORONARY ANGIOGRAPHY;  Surgeon: Belva Crome, MD;  Location: [place] CV [facility];  Service: Cardiovascular;  Laterality: N/A;   TONSILLECTOMY      Social History   Socioeconomic History   Marital status: Married    Spouse name: Not on file   Number of children: 4   Years of education: Not on file   Highest education level: Not on file  Occupational  History   Not on file  Tobacco Use   Smoking status: Some Days    Types: Cigarettes    Last attempt to quit: 08/16/2006    Years since quitting: 14.6   Smokeless tobacco: Never  Substance and Sexual Activity   Alcohol use: Yes    Comment: Rare   Drug use: No   Sexual activity: Yes  Other Topics Concern   Not on file  Social History Narrative   Not on file   Social Determinants of Health   Financial Resource Strain: Not on file  Food Insecurity: Not on file  Transportation Needs: Not on file  Physical Activity: Not on file  Stress: Not on file  Social Connections: Not on file  Intimate Partner Violence: Not on file    Current Outpatient Medications on File Prior to Visit  Medication Sig Dispense Refill   amLODipine (NORVASC) 5 MG tablet Take 5 mg by mouth daily.     aspirin EC 81 MG tablet Take 81 mg by mouth daily.     atorvastatin (LIPITOR) 80 MG tablet Take 80 mg by mouth daily.     furosemide (LASIX) 40 MG tablet Take 2 tablets (80 mg total) by mouth 2 (two) times daily. [place]  tablet 3   losartan (COZAAR) 100 MG tablet Take 100 mg by mouth daily.     metoprolol tartrate (LOPRESSOR) 50 MG tablet Take 1 tablet (50 mg total) by mouth 2 (two) times daily. 180 tablet 3   Multiple Vitamins-Minerals (ONE-A-DAY MENS 50+ PO) Take 1 tablet by mouth daily.     nitroGLYCERIN (NITROSTAT) 0.4 MG SL tablet Place 1 tablet (0.4 mg total) under the tongue every 5 (five) minutes x 3 doses as needed for chest pain. 25 tablet 3   omega-3 acid ethyl esters (LOVAZA) 1 g capsule Take 1 capsule (1 g total) by mouth 2 (two) times daily. 180 capsule 3   pantoprazole (PROTONIX) 40 MG tablet Take 1 tablet (40 mg total) by mouth daily. 30 tablet 11   tiZANidine (ZANAFLEX) 4 MG tablet TAKE 1 TABLET BY MOUTH AT BEDTIME 30 tablet 1   warfarin (COUMADIN) 3 MG tablet Take 4 mg by mouth daily.     No current facility-administered medications on file prior to visit.    Allergies  Allergen Reactions    Cephalexin Anaphylaxis   Indocin [Indomethacin] Other (See Comments)     Gi bleed    Family History  Problem Relation Age of Onset   Alcohol abuse Other    Drug abuse Other    Arthritis Other    Hypertension Other    Diabetes Neg Hx     BP (!) 150/79 (BP Location: Right Arm, Patient Position: Sitting, Cuff Size: Large)   Pulse 100   Ht '6\' 3"'$  (1.905 m)   Wt 264 lb 3.2 oz (119.8 kg)   SpO2 95%   BMI 33.02 kg/m     Review of Systems denies sob, n/v, memory loss, and depression.      Objective:   Physical Exam Pulses: dorsalis pedis intact bilat.   MSK: no deformity of the feet CV: 1+ bilat leg edema Skin:  no ulcer on the feet.  normal color and temp on the feet. Neuro: sensation is intact to touch on the feet, but decreased from normal Ext: there is bilateral onychomycosis of the toenails.    Lab Results  Component Value Date   HGBA1C 10.4 (A) 04/01/2021   Lab Results  Component Value Date   CREATININE 1.56 (H) 09/30/2020   BUN 22 09/30/2020   NA 143 09/30/2020   K 4.1 09/30/2020   CL 101 09/30/2020   CO2 23 09/30/2020   I have reviewed outside records, and summarized: Pt was noted to have elevated A1c, and referred here.  He was also seen by cardiol for CAD and AF     Assessment & Plan:  Insulin-requiring type 2 DM: uncontrolled   Patient Instructions  good diet and exercise significantly improve the control of your diabetes.  please let me know if you wish to be referred to a dietician.  high blood sugar is very risky to your health.  you should see an eye doctor and dentist every year.  It is very important to get all recommended vaccinations.  Controlling your blood pressure and cholesterol drastically reduces the damage diabetes does to your body.  Those who smoke should quit.  Please discuss these with your doctor.  check your blood sugar twice a day.  vary the time of day when you check, between before the 3 meals, and at bedtime.  also check if you  have symptoms of your blood sugar being too high or too low.  please keep a record of the readings  and bring it to your next appointment here (or you can bring the meter itself).  You can write it on any piece of paper.  please call us sooner if your blood sugar goes below 70, or if most of your readings are over 200. We will need to take this complex situation in stages For now, please continue the same Lantus, and: I have sent a prescription to your pharmacy, to start "Mounjaro." You can stop taking the [place]. Please call or message [place] in 2 weeks, to tell us how the blood sugar is doing, and if you have nausea/heartburn Our goal will be to increase the [facility].  Please come back for a follow-up appointment in 2 months.

## 2021-04-01 NOTE — Patient Instructions (Addendum)
good diet and exercise significantly improve the control of your diabetes.  please let me know if you wish to be referred to a dietician.  high blood sugar is very risky to your health.  you should see an eye doctor and dentist every year.  It is very important to get all recommended vaccinations.  Controlling your blood pressure and cholesterol drastically reduces the damage diabetes does to your body.  Those who smoke should quit.  Please discuss these with your doctor.  check your blood sugar twice a day.  vary the time of day when you check, between before the 3 meals, and at bedtime.  also check if you have symptoms of your blood sugar being too high or too low.  please keep a record of the readings and bring it to your next appointment here (or you can bring the meter itself).  You can write it on any piece of paper.  please call us sooner if your blood sugar goes below 70, or if most of your readings are over 200. We will need to take this complex situation in stages For now, please continue the same Lantus, and: I have sent a prescription to your pharmacy, to start "Mounjaro." You can stop taking the [place]. Please call or message [place] in 2 weeks, to tell us how the blood sugar is doing, and if you have nausea/heartburn Our goal will be to increase the [facility].  Please come back for a follow-up appointment in 2 months.

## 2021-04-03 NOTE — Telephone Encounter (Signed)
Pt is calling regarding lantus and stating that insurance does not cover it and would like to know what the alternatives would be   [id number] [place] [address]

## 2021-04-05 ENCOUNTER — Other Ambulatory Visit: Payer: Self-pay | Admitting: Endocrinology

## 2021-04-05 MED ORDER — OZEMPIC (0.25 OR 0.5 MG/DOSE) 2 MG/1.5ML ~~LOC~~ SOPN: 0.5000 mg | PEN_INJECTOR | SUBCUTANEOUS | 3 refills | Status: AC

## 2021-04-06 ENCOUNTER — Telehealth: Payer: Self-pay | Admitting: Endocrinology

## 2021-04-06 DIAGNOSIS — Z7901 Long term (current) use of anticoagulants: Secondary | ICD-10-CM | POA: Diagnosis not present

## 2021-04-06 NOTE — Telephone Encounter (Signed)
Pt states insurance won't cover Rx mounjaro and want's to know if something that the insurance will cover be called in.

## 2021-04-06 NOTE — Telephone Encounter (Signed)
Message sent thru MyChart 

## 2021-05-01 DIAGNOSIS — I10 Essential (primary) hypertension: Secondary | ICD-10-CM | POA: Diagnosis not present

## 2021-05-01 DIAGNOSIS — E1129 Type 2 diabetes mellitus with other diabetic kidney complication: Secondary | ICD-10-CM | POA: Diagnosis not present

## 2021-05-01 DIAGNOSIS — Z7901 Long term (current) use of anticoagulants: Secondary | ICD-10-CM | POA: Diagnosis not present

## 2021-05-01 DIAGNOSIS — I4891 Unspecified atrial fibrillation: Secondary | ICD-10-CM | POA: Diagnosis not present

## 2021-05-18 DIAGNOSIS — E785 Hyperlipidemia, unspecified: Secondary | ICD-10-CM | POA: Diagnosis not present

## 2021-05-18 DIAGNOSIS — I4891 Unspecified atrial fibrillation: Secondary | ICD-10-CM | POA: Diagnosis not present

## 2021-05-18 DIAGNOSIS — E1129 Type 2 diabetes mellitus with other diabetic kidney complication: Secondary | ICD-10-CM | POA: Diagnosis not present

## 2021-06-01 DIAGNOSIS — I4891 Unspecified atrial fibrillation: Secondary | ICD-10-CM | POA: Diagnosis not present

## 2021-06-01 DIAGNOSIS — Z7901 Long term (current) use of anticoagulants: Secondary | ICD-10-CM | POA: Diagnosis not present

## 2021-06-01 DIAGNOSIS — I10 Essential (primary) hypertension: Secondary | ICD-10-CM | POA: Diagnosis not present

## 2021-06-01 DIAGNOSIS — E1129 Type 2 diabetes mellitus with other diabetic kidney complication: Secondary | ICD-10-CM | POA: Diagnosis not present

## 2021-06-03 ENCOUNTER — Ambulatory Visit: Payer: Medicare Other | Admitting: Endocrinology

## 2021-06-25 DIAGNOSIS — E1129 Type 2 diabetes mellitus with other diabetic kidney complication: Secondary | ICD-10-CM | POA: Diagnosis not present

## 2021-06-29 DIAGNOSIS — Z7901 Long term (current) use of anticoagulants: Secondary | ICD-10-CM | POA: Diagnosis not present

## 2021-06-29 DIAGNOSIS — I482 Chronic atrial fibrillation, unspecified: Secondary | ICD-10-CM | POA: Diagnosis not present

## 2021-06-29 DIAGNOSIS — E1129 Type 2 diabetes mellitus with other diabetic kidney complication: Secondary | ICD-10-CM | POA: Diagnosis not present

## 2021-07-10 DIAGNOSIS — R0600 Dyspnea, unspecified: Secondary | ICD-10-CM | POA: Diagnosis not present

## 2021-07-10 DIAGNOSIS — R051 Acute cough: Secondary | ICD-10-CM | POA: Diagnosis not present

## 2021-07-10 DIAGNOSIS — R0981 Nasal congestion: Secondary | ICD-10-CM | POA: Diagnosis not present

## 2021-07-10 DIAGNOSIS — M791 Myalgia, unspecified site: Secondary | ICD-10-CM | POA: Diagnosis not present

## 2021-07-21 DIAGNOSIS — Z20822 Contact with and (suspected) exposure to covid-19: Secondary | ICD-10-CM | POA: Diagnosis not present

## 2021-07-21 DIAGNOSIS — J020 Streptococcal pharyngitis: Secondary | ICD-10-CM | POA: Diagnosis not present

## 2021-07-21 DIAGNOSIS — R059 Cough, unspecified: Secondary | ICD-10-CM | POA: Diagnosis not present

## 2021-07-29 DIAGNOSIS — E119 Type 2 diabetes mellitus without complications: Secondary | ICD-10-CM | POA: Diagnosis not present

## 2021-07-31 DIAGNOSIS — Z136 Encounter for screening for cardiovascular disorders: Secondary | ICD-10-CM | POA: Diagnosis not present

## 2021-07-31 DIAGNOSIS — F17210 Nicotine dependence, cigarettes, uncomplicated: Secondary | ICD-10-CM | POA: Diagnosis not present

## 2021-07-31 DIAGNOSIS — Z720 Tobacco use: Secondary | ICD-10-CM | POA: Diagnosis not present

## 2021-07-31 DIAGNOSIS — Z139 Encounter for screening, unspecified: Secondary | ICD-10-CM | POA: Diagnosis not present

## 2021-07-31 DIAGNOSIS — Z7901 Long term (current) use of anticoagulants: Secondary | ICD-10-CM | POA: Diagnosis not present

## 2021-07-31 DIAGNOSIS — Z0000 Encounter for general adult medical examination without abnormal findings: Secondary | ICD-10-CM | POA: Diagnosis not present

## 2021-07-31 DIAGNOSIS — I482 Chronic atrial fibrillation, unspecified: Secondary | ICD-10-CM | POA: Diagnosis not present

## 2021-08-03 DIAGNOSIS — R059 Cough, unspecified: Secondary | ICD-10-CM | POA: Diagnosis not present

## 2021-08-03 DIAGNOSIS — Z20822 Contact with and (suspected) exposure to covid-19: Secondary | ICD-10-CM | POA: Diagnosis not present

## 2021-08-28 DIAGNOSIS — E1129 Type 2 diabetes mellitus with other diabetic kidney complication: Secondary | ICD-10-CM | POA: Diagnosis not present

## 2021-08-28 DIAGNOSIS — E785 Hyperlipidemia, unspecified: Secondary | ICD-10-CM | POA: Diagnosis not present

## 2021-09-03 NOTE — Progress Notes (Deleted)
HPI:FU coronary artery disease. Abdominal ultrasound in August of 2010 showed no aneurysm.  Last cardiac catheterization September 2019 showed 30 to 40% first diagonal, 60 to 70% circumflex, 50% obtuse marginal, 50 to 60% RCA, 50 to 60% PDA and ejection fraction 60%.  Left ventricular end-diastolic pressure 18.  Medical therapy recommended. PYP scan January 2020 equivocal.  Patient was admitted to [facility] January 2020 with atrial flutter.  He had TEE guided cardioversion followed by ablation. Patient had atrial fibrillation ablation August 2020.  Echocardiogram July 2022 showed normal LV function, mild left ventricular hypertrophy, grade 1 diastolic dysfunction.  Since last seen,   Current Outpatient Medications  Medication Sig Dispense Refill   amLODipine (NORVASC) 5 MG tablet Take 5 mg by mouth daily.     aspirin EC 81 MG tablet Take 81 mg by mouth daily.     atorvastatin (LIPITOR) 80 MG tablet Take 80 mg by mouth daily.     furosemide (LASIX) 40 MG tablet Take 2 tablets (80 mg total) by mouth 2 (two) times daily. 360 tablet 3   insulin glargine (LANTUS SOLOSTAR) 100 UNIT/ML Solostar Pen Inject 30 Units into the skin every morning. And pen needles 1/day 30 mL PRN   losartan (COZAAR) 100 MG tablet Take 100 mg by mouth daily.     metoprolol tartrate (LOPRESSOR) 50 MG tablet Take 1 tablet (50 mg total) by mouth 2 (two) times daily. 180 tablet 3   Multiple Vitamins-Minerals (ONE-A-DAY MENS 50+ PO) Take 1 tablet by mouth daily.     nitroGLYCERIN (NITROSTAT) 0.4 MG SL tablet Place 1 tablet (0.4 mg total) under the tongue every 5 (five) minutes x 3 doses as needed for chest pain. 25 tablet 3   omega-3 acid ethyl esters (LOVAZA) 1 g capsule Take 1 capsule (1 g total) by mouth 2 (two) times daily. 180 capsule 3   pantoprazole (PROTONIX) 40 MG tablet Take 1 tablet (40 mg total) by mouth daily. 30 tablet 11   Semaglutide,0.25 or 0.5MG /DOS, (OZEMPIC, 0.25 OR 0.5 MG/DOSE,) 2 MG/1.5ML SOPN Inject 0.5 mg  into the skin once a week. 4.5 mL 3   tiZANidine (ZANAFLEX) 4 MG tablet TAKE 1 TABLET BY MOUTH AT BEDTIME 30 tablet 1   warfarin (COUMADIN) 3 MG tablet Take 4 mg by mouth daily.     No current facility-administered medications for this visit.     Past Medical History:  Diagnosis Date   Arthritis    CAD (coronary artery disease) 05/03/2018   moderate CAD, normal LVF- medical rx   Frequent urination    GERD (gastroesophageal reflux disease)    Hyperlipidemia    Hypertension 05/03/2018   normal LVF, moderate LVH, grade 2 DD   Hypertrophy of prostate with urinary obstruction and other lower urinary tract symptoms (LUTS)    Low back pain    Low HDL (under 40)    Shortness of breath dyspnea    with ambulation   Skin cancer    removed from face    Past Surgical History:  Procedure Laterality Date   ANTERIOR LAT LUMBAR FUSION  04/07/2012   Procedure: ANTERIOR LATERAL LUMBAR FUSION 2 LEVELS;  Surgeon: Eustace Moore, MD;  Location: [facility];  Service: Neurosurgery;  Laterality: Right;  Lumbar three-four and four-five extreme lumbar interbody fusion   BACK SURGERY     CARDIAC CATHETERIZATION     COLONOSCOPY W/ POLYPECTOMY     EYE SURGERY     tightened muscles in  right eye   LEFT HEART CATH AND CORONARY ANGIOGRAPHY N/A 05/03/2018   Procedure: LEFT HEART CATH AND CORONARY ANGIOGRAPHY;  Surgeon: Belva Crome, MD;  Location: [facility];  Service: Cardiovascular;  Laterality: N/A;   TONSILLECTOMY      Social History   Socioeconomic History   Marital status: Married    Spouse name: Not on file   Number of children: 4   Years of education: Not on file   Highest education level: Not on file  Occupational History   Not on file  Tobacco Use   Smoking status: Some Days    Types: Cigarettes    Last attempt to quit: 08/16/2006    Years since quitting: 15.0   Smokeless tobacco: Never  Substance and Sexual Activity   Alcohol use: Yes    Comment: Rare   Drug use: No    Sexual activity: Yes  Other Topics Concern   Not on file  Social History Narrative   Not on file   Social Determinants of Health   Financial Resource Strain: Not on file  Food Insecurity: Not on file  Transportation Needs: Not on file  Physical Activity: Not on file  Stress: Not on file  Social Connections: Not on file  Intimate Partner Violence: Not on file    Family History  Problem Relation Age of Onset   Alcohol abuse Other    Drug abuse Other    Arthritis Other    Hypertension Other    Diabetes Neg Hx     ROS: no fevers or chills, productive cough, hemoptysis, dysphasia, odynophagia, melena, hematochezia, dysuria, hematuria, rash, seizure activity, orthopnea, PND, pedal edema, claudication. Remaining systems are negative.  Physical Exam: Well-developed well-nourished in no acute distress.  Skin is warm and dry.  HEENT is normal.  Neck is supple.  Chest is clear to auscultation with normal expansion.  Cardiovascular exam is regular rate and rhythm.  Abdominal exam nontender or distended. No masses palpated. Extremities show no edema. neuro grossly intact  ECG- personally reviewed  A/P  1 paroxysmal atrial fibrillation/flutter-patient is status post ablation.  We will continue Coumadin with goal INR 2-3.  2 history of moderate coronary disease-patient doing well with no chest pain.  Continue statin.  He is not on aspirin given need for anticoagulation.  3 hypertension-patient's blood pressure is controlled.  Continue present medical regimen.  4 chronic diastolic congestive heart failure-appears to be euvolemic.  We will continue Lasix at present dose.  Check potassium and renal function.  5 hyperlipidemia-continue statin.  6 chronic stage III kidney disease-patient is followed by nephrology.  Kirk Ruths, MD

## 2021-09-04 DIAGNOSIS — I482 Chronic atrial fibrillation, unspecified: Secondary | ICD-10-CM | POA: Diagnosis not present

## 2021-09-04 DIAGNOSIS — Z139 Encounter for screening, unspecified: Secondary | ICD-10-CM | POA: Diagnosis not present

## 2021-09-04 DIAGNOSIS — I2510 Atherosclerotic heart disease of native coronary artery without angina pectoris: Secondary | ICD-10-CM | POA: Diagnosis not present

## 2021-09-04 DIAGNOSIS — E1159 Type 2 diabetes mellitus with other circulatory complications: Secondary | ICD-10-CM | POA: Diagnosis not present

## 2021-09-04 DIAGNOSIS — E785 Hyperlipidemia, unspecified: Secondary | ICD-10-CM | POA: Diagnosis not present

## 2021-09-04 DIAGNOSIS — I509 Heart failure, unspecified: Secondary | ICD-10-CM | POA: Diagnosis not present

## 2021-09-16 ENCOUNTER — Ambulatory Visit: Payer: Medicare Other | Admitting: Cardiology

## 2021-09-24 DIAGNOSIS — H02413 Mechanical ptosis of bilateral eyelids: Secondary | ICD-10-CM | POA: Diagnosis not present

## 2021-09-24 DIAGNOSIS — H57813 Brow ptosis, bilateral: Secondary | ICD-10-CM | POA: Diagnosis not present

## 2021-09-24 DIAGNOSIS — H02423 Myogenic ptosis of bilateral eyelids: Secondary | ICD-10-CM | POA: Diagnosis not present

## 2021-09-24 DIAGNOSIS — H53483 Generalized contraction of visual field, bilateral: Secondary | ICD-10-CM | POA: Diagnosis not present

## 2021-09-28 DIAGNOSIS — L578 Other skin changes due to chronic exposure to nonionizing radiation: Secondary | ICD-10-CM | POA: Diagnosis not present

## 2021-09-28 DIAGNOSIS — L821 Other seborrheic keratosis: Secondary | ICD-10-CM | POA: Diagnosis not present

## 2021-09-28 DIAGNOSIS — L570 Actinic keratosis: Secondary | ICD-10-CM | POA: Diagnosis not present

## 2021-09-30 DIAGNOSIS — R059 Cough, unspecified: Secondary | ICD-10-CM | POA: Diagnosis not present

## 2021-09-30 DIAGNOSIS — H73891 Other specified disorders of tympanic membrane, right ear: Secondary | ICD-10-CM | POA: Diagnosis not present

## 2021-10-01 DIAGNOSIS — R918 Other nonspecific abnormal finding of lung field: Secondary | ICD-10-CM | POA: Diagnosis not present

## 2021-10-01 DIAGNOSIS — R059 Cough, unspecified: Secondary | ICD-10-CM | POA: Diagnosis not present

## 2021-10-01 DIAGNOSIS — R0602 Shortness of breath: Secondary | ICD-10-CM | POA: Diagnosis not present

## 2021-10-06 DIAGNOSIS — N6489 Other specified disorders of breast: Secondary | ICD-10-CM | POA: Diagnosis not present

## 2021-10-06 DIAGNOSIS — N62 Hypertrophy of breast: Secondary | ICD-10-CM | POA: Diagnosis not present

## 2021-10-06 DIAGNOSIS — R928 Other abnormal and inconclusive findings on diagnostic imaging of breast: Secondary | ICD-10-CM | POA: Diagnosis not present

## 2021-10-09 DIAGNOSIS — I482 Chronic atrial fibrillation, unspecified: Secondary | ICD-10-CM | POA: Diagnosis not present

## 2021-10-14 DIAGNOSIS — H53483 Generalized contraction of visual field, bilateral: Secondary | ICD-10-CM | POA: Diagnosis not present

## 2021-10-15 DIAGNOSIS — N62 Hypertrophy of breast: Secondary | ICD-10-CM | POA: Diagnosis not present

## 2021-11-06 DIAGNOSIS — I482 Chronic atrial fibrillation, unspecified: Secondary | ICD-10-CM | POA: Diagnosis not present

## 2021-11-06 DIAGNOSIS — Z7901 Long term (current) use of anticoagulants: Secondary | ICD-10-CM | POA: Diagnosis not present

## 2021-11-06 DIAGNOSIS — Z789 Other specified health status: Secondary | ICD-10-CM | POA: Diagnosis not present

## 2021-11-14 DIAGNOSIS — E785 Hyperlipidemia, unspecified: Secondary | ICD-10-CM | POA: Diagnosis not present

## 2021-11-18 ENCOUNTER — Telehealth: Payer: Self-pay | Admitting: *Deleted

## 2021-11-18 ENCOUNTER — Telehealth: Payer: Self-pay

## 2021-11-18 NOTE — Telephone Encounter (Signed)
Patient with diagnosis of afib on warfarin for anticoagulation.   ? ?Procedure: bilateral upper eyelid blepharoplasty with mid forehead brow ptosis repair ?Date of procedure: TBD ? ?CHA2DS2-VASc Score = 5  ?This indicates a 7.2% annual risk of stroke. ?The patient's score is based upon: ?CHF History: 0 ?HTN History: 1 ?Diabetes History: 1 ?Stroke History: 0 ?Vascular Disease History: 1 ?Age Score: 2 ?Gender Score: 0 ?  ?CrCl 61m/min using adjusted body weight due to obesity ?Platelet count 215K ? ?Per office protocol, patient can hold warfarin for 5 days prior to procedure. Patient will not need bridging with Lovenox (enoxaparin) around procedure. ?

## 2021-11-18 NOTE — Telephone Encounter (Addendum)
? ?  Patient Name: Jack Farmer  ?DOB: 09-02-1940 ?MRN: [id number] ? ?Primary Cardiologist: Kirk Ruths, MD ? ?Chart reviewed as part of pre-operative protocol coverage.  ? ?81 year old male with a history of CAD, CKD stage III, atrial flutter and atrial fibrillation s/p ablation in 2020, hypertension, hyperlipidemia and BPH.  ? ?Cath in 2019 showed 30 to 40% disease in first diagonal, 60 to 70% disease in left circumflex artery, 30% OM, 50 to 60% RCA, 50 to 60% PDA, EF 60%.  Medical therapy was recommended.  EF normal on most recent echo in July 2022.  He was last seen in the office by Almyra Deforest, PA, on 03/20/2021 and was stable from a cardiac standpoint. ? ?Received surgical clearance request for upcoming bilateral upper eyelid blepharoplasty with mid forehead brow ptosis repair, date TBD.  ? ?Dr. Stanford Breed, please advise on holding aspirin prior to this procedure. ? ?Please route response to [facility] PREOP.  ? ?Thank you.  ?  ?Lenna Sciara, NP ?11/18/2021, 2:10 PM ? ?

## 2021-11-18 NOTE — Telephone Encounter (Signed)
? ?  Patient Name: Jack Farmer  ?DOB: [birth year] ?MRN: [id number] ? ?Primary Cardiologist: Kirk Ruths, MD ? ?Chart reviewed as part of pre-operative protocol coverage.  ? ?Per pharmacy recommendation, patient can hold warfarin for 5 days prior to procedure, patient will not need bridging with Lovenox around procedure. Additionally, per Dr. Stanford Breed, patient may hold for 5 to 7 days prior to procedure with recommendation to resume warfarin and aspirin as soon as possible postprocedure, at the discretion of the surgeon. ? ?Preoperative team, please contact this patient and set up a phone call appointment for further cardiac evaluation.  Thank you for your help. ? ? ?Lenna Sciara, NP ?11/18/2021, 3:54 PM ? ?

## 2021-11-18 NOTE — Telephone Encounter (Signed)
? ?  Pre-operative Risk Assessment  ?  ?Patient Name: Jack Farmer  ?DOB: 06-02-1941 ?MRN: [id number]  ? ?  ? ?Request for Surgical Clearance   ? ?Procedure:  bilateral upper eyelid blepharoplasty with mid forehead brow ptosis repair. ? ?Date of Surgery:  Clearance TBD                              ?   ?Surgeon:  Isidoro Donning  ?Surgeon's Group or Practice Name:  [facility]  ?Phone number:  [phone] ?Fax number:  [phone] ?  ?Type of Clearance Requested:   ?Aspirin and coumadin; stop for how many days before procedure? ?  ?Type of Anesthesia:  MAC ?  ?Additional requests/questions:   ? ?Signed, ?Kathreen Devoid   ?11/18/2021, 7:55 AM  ? ?

## 2021-11-18 NOTE — Telephone Encounter (Signed)
Pt is agreeable for tele pre op appt 11/27/21 @ 10 am. Pt also said he is due to see Dr. Stanford Breed and asked still for an in office appt. Pt has been scheduled to see Diona Browner, NP [facility] day Dr. Stanford Breed is in the office. Pt thankful for the tele appt for the pre op clearance though.  ?

## 2021-11-18 NOTE — Telephone Encounter (Signed)
Pt is agreeable for tele pre op appt 11/27/21 @ 10 am. Pt also said he is due to see Dr. Stanford Breed and asked still for an in office appt. Pt has been scheduled to see Diona Browner, NP sa [facility] day Dr. Stanford Breed is in the office. Pt thankful for the tele appt for the pre op clearance though.  ? ? ?  ?Patient Consent for Virtual Visit  ? ? ?   ? ?Jack Farmer has provided verbal consent on 11/18/2021 for a virtual visit (video or telephone). ? ? ?CONSENT FOR VIRTUAL VISIT FOR:  Jack Farmer  ?By participating in this virtual visit I agree to the following: ? ?I hereby voluntarily request, consent and authorize [facility] and its employed or contracted physicians, physician assistants, nurse practitioners or other licensed health care professionals (the Practitioner), to provide me with telemedicine health care services (the ?Services") as deemed necessary by the treating Practitioner. I acknowledge and consent to receive the Services by the Practitioner via telemedicine. I understand that the telemedicine visit will involve communicating with the Practitioner through live audiovisual communication technology and the disclosure of certain medical information by electronic transmission. I acknowledge that I have been given the opportunity to request an in-person assessment or other available alternative prior to the telemedicine visit and am voluntarily participating in the telemedicine visit. ? ?I understand that I have the right to withhold or withdraw my consent to the use of telemedicine in the course of my care at any time, without affecting my right to future care or treatment, and that the Practitioner or I may terminate the telemedicine visit at any time. I understand that I have the right to inspect all information obtained and/or recorded in the course of the telemedicine visit and may receive copies of available information for a reasonable fee.  I understand that some of the potential risks of  receiving the Services via telemedicine include:  ?Delay or interruption in medical evaluation due to technological equipment failure or disruption; ?Information transmitted may not be sufficient (e.g. poor resolution of images) to allow for appropriate medical decision making by the Practitioner; and/or  ?In rare instances, security protocols could fail, causing a breach of personal health information. ? ?Furthermore, I acknowledge that it is my responsibility to provide information about my medical history, conditions and care that is complete and accurate to the best of my ability. I acknowledge that Practitioner's advice, recommendations, and/or decision may be based on factors not within their control, such as incomplete or inaccurate data provided by me or distortions of diagnostic images or specimens that may result from electronic transmissions. I understand that the practice of medicine is not an exact science and that Practitioner makes no warranties or guarantees regarding treatment outcomes. I acknowledge that a copy of this consent can be made available to me via my patient portal ([facility]), or I can request a printed copy by calling the office of [facility].   ? ?I understand that my insurance will be billed for this visit.  ? ?I have read or had this consent read to me. ?I understand the contents of this consent, which adequately explains the benefits and risks of the Services being provided via telemedicine.  ?I have been provided ample opportunity to ask questions regarding this consent and the Services and have had my questions answered to my satisfaction. ?I give my informed consent for the services to be provided through the use of telemedicine  in my medical care ? ? ? ?

## 2021-11-27 ENCOUNTER — Ambulatory Visit (INDEPENDENT_AMBULATORY_CARE_PROVIDER_SITE_OTHER): Payer: Medicare Other | Admitting: Physician Assistant

## 2021-11-27 ENCOUNTER — Encounter: Payer: Self-pay | Admitting: Physician Assistant

## 2021-11-27 DIAGNOSIS — Z01810 Encounter for preprocedural cardiovascular examination: Secondary | ICD-10-CM

## 2021-11-27 NOTE — Progress Notes (Signed)
? ?Virtual Visit via Telephone Note  ? ?This visit type was conducted due to national recommendations for restrictions regarding the COVID-19 Pandemic (e.g. social distancing) in an effort to limit this patient's exposure and mitigate transmission in our community.  Due to his co-morbid illnesses, this patient is at least at moderate risk for complications without adequate follow up.  This format is felt to be most appropriate for this patient at this time.  The patient did not have access to video technology/had technical difficulties with video requiring transitioning to audio format only (telephone).  All issues noted in this document were discussed and addressed.  No physical exam could be performed with this format.  Please refer to the patient's chart for his  consent to telehealth for [facility] ? ?Evaluation Performed:  Preoperative cardiovascular risk assessment ?_____________  ? ?Date:  11/27/2021  ? ?Patient ID:  Jack Farmer, Jack Farmer [birth year], MRN [id number] ?Patient Location:  ?Home ?Provider location:   ?Office ? ?Primary Care Provider:  Philmore Pali, NP ?Primary Cardiologist:  Kirk Ruths, MD ? ?Chief Complaint  ?  ?81 y.o. y/o male with a h/o CAD, CKD stage III, Aflutter, Afib, HTN, HLD, and BPH, who is pending bilateral upper eyelid blepharoplasty with mid forehead brow ptosis repair, and presents today for telephonic preoperative cardiovascular risk assessment.  ? ?Past Medical History  ?  ?Past Medical History:  ?Diagnosis Date  ? Arthritis   ? CAD (coronary artery disease) 05/03/2018  ? moderate CAD, normal LVF- medical rx  ? Frequent urination   ? GERD (gastroesophageal reflux disease)   ? Hyperlipidemia   ? Hypertension 05/03/2018  ? normal LVF, moderate LVH, grade 2 DD  ? Hypertrophy of prostate with urinary obstruction and other lower urinary tract symptoms (LUTS)   ? Low back pain   ? Low HDL (under 40)   ? Shortness of breath dyspnea   ? with ambulation  ? Skin cancer   ?  removed from face  ? ?Past Surgical History:  ?Procedure Laterality Date  ? ANTERIOR LAT LUMBAR FUSION  04/07/2012  ? Procedure: ANTERIOR LATERAL LUMBAR FUSION 2 LEVELS;  Surgeon: Eustace Moore, MD;  Location: [facility];  Service: Neurosurgery;  Laterality: Right;  Lumbar three-four and four-five extreme lumbar interbody fusion  ? BACK SURGERY    ? CARDIAC CATHETERIZATION    ? COLONOSCOPY W/ POLYPECTOMY    ? EYE SURGERY    ? tightened muscles in right eye  ? LEFT HEART CATH AND CORONARY ANGIOGRAPHY N/A 05/03/2018  ? Procedure: LEFT HEART CATH AND CORONARY ANGIOGRAPHY;  Surgeon: Belva Crome, MD;  Location: [facility];  Service: Cardiovascular;  Laterality: N/A;  ? TONSILLECTOMY    ? ? ?Allergies ? ?Allergies  ?Allergen Reactions  ? Cephalexin Anaphylaxis  ? Indocin [Indomethacin] Other (See Comments)  ?   Gi bleed  ? ? ?History of Present Illness  ?  ?Jack Farmer is a 81 y.o. male who presents via audio/video conferencing for a telehealth visit today.  Pt was last seen in cardiology clinic on 03/20/21 by Almyra Deforest PA-C.  At that time Jack Farmer was doing well.  The patient is now pending bilateral upper eyelid blepharoplasty with mid forehead brow ptosis repair.  Since his last visit, he has done well. He denies chest pain and shortness of breath. He is able to climb a flight of stairs (did so yesterday at dentis office) and can walk about  0.25 miles without stopping. He is primarily limited by back pain.  ? ? ?Home Medications  ?  ?Prior to Admission medications   ?Medication Sig Start Date End Date Taking? Authorizing Provider  ?amLODipine (NORVASC) 5 MG tablet Take 5 mg by mouth daily.    [provider]  ?aspirin EC 81 MG tablet Take 81 mg by mouth daily.    [provider]  ?atorvastatin (LIPITOR) 80 MG tablet Take 80 mg by mouth daily.    [provider]  ?dapagliflozin propanediol (FARXIGA) 10 MG TABS tablet Take 1 tablet by mouth daily. 09/29/21    [provider]  ?docusate sodium (COLACE) 100 MG capsule Take 100 mg by mouth 2 (two) times daily.    [provider]  ?Flaxseed, Linseed, (FLAXSEED OIL PO) Take 1 Scoop by mouth daily.    [provider]  ?furosemide (LASIX) 40 MG tablet Take 2 tablets (80 mg total) by mouth 2 (two) times daily. 09/23/20   Lelon Perla, MD  ?insulin glargine (LANTUS SOLOSTAR) 100 UNIT/ML Solostar Pen Inject 30 Units into the skin every morning. And pen needles 1/day 04/01/21   Renato Shin, MD  ?losartan (COZAAR) 100 MG tablet Take 100 mg by mouth daily.    [provider]  ?metoprolol tartrate (LOPRESSOR) 50 MG tablet Take 1 tablet (50 mg total) by mouth 2 (two) times daily. 05/11/19   Kroeger, Lorelee Cover., PA-C  ?Multiple Vitamins-Minerals (ONE-A-DAY MENS 50+ PO) Take 1 tablet by mouth daily. ?Patient not taking: Reported on 11/18/2021    [provider]  ?multivitamin-lutein (OCUVITE-LUTEIN) CAPS capsule Take 1 capsule by mouth daily.    [provider]  ?nitroGLYCERIN (NITROSTAT) 0.4 MG SL tablet Place 1 tablet (0.4 mg total) under the tongue every 5 (five) minutes x 3 doses as needed for chest pain. ?Patient not taking: Reported on 11/18/2021 05/04/18   Abigail Butts., PA-C  ?omega-3 acid ethyl esters (LOVAZA) 1 g capsule Take 1 capsule (1 g total) by mouth 2 (two) times daily. 02/06/21   Almyra Deforest, PA  ?pantoprazole (PROTONIX) 40 MG tablet Take 1 tablet (40 mg total) by mouth daily. ?Patient not taking: Reported on 11/18/2021 02/26/21   Lelon Perla, MD  ?Semaglutide,0.25 or 0.'5MG'$ /DOS, (OZEMPIC, 0.25 OR 0.5 MG/DOSE,) 2 MG/1.5ML SOPN Inject 0.5 mg into the skin once a week. 04/05/21   Renato Shin, MD  ?tiZANidine (ZANAFLEX) 4 MG tablet TAKE 1 TABLET BY MOUTH AT BEDTIME ?Patient not taking: Reported on 11/18/2021 10/25/18   Janith Lima, MD  ?warfarin (COUMADIN) 3 MG tablet Take 4 mg by mouth daily.    [provider]  ? ? ?Physical Exam  ?  ?Vital Signs:  Jack Farmer does not have vital signs available for review today. ? ?Given telephonic nature of communication, physical exam is limited. ?AAOx3. NAD. Normal affect.  Speech and respirations are unlabored. ? ?Accessory Clinical Findings  ?  ?None ? ?Assessment & Plan  ?  ?1.  Preoperative Cardiovascular Risk Assessment: ? ?Heart cath 2019 and echo 2022.  ?He has nonobstructive CAD by heart cath, but no ischemic heart disease, and IDDM. He has documented HFpEF. His creatinine has been under 2.0. According to the RCRI, he has a 6.6% risk of MACE. I assigned one point for CHF since he has a history of requiring diuresis. I did not assign a point for ischemic heart disease, although we discussed he may be at higher risk given his known nonobstructive  heart disease.  He reports activity equivalent to 4.0 METS without chest pain. I do not think additional testing at this time will mitigate his risk.  ? ?Per pharmacy recommendation, patient can hold warfarin for 5 days prior to procedure, patient will not need bridging with Lovenox around procedure. Additionally, per Dr. Stanford Breed, patient may hold ASA for 5 to 7 days prior to procedure with recommendation to resume warfarin and aspirin as soon as possible postprocedure, at the discretion of the surgeon. ? ?Therefore, based on ACC/AHA guidelines, the patient would be at acceptable risk for the planned procedure without further cardiovascular testing.  ? ?The patient was advised that if she develops new symptoms prior to surgery to contact our office to arrange for a follow-up visit, and she verbalized understanding. ? ? ? ? ?A copy of this note will be routed to requesting surgeon. ? ?Time:   ?Today, I have spent 10 minutes with the patient with telehealth technology discussing medical history, symptoms, and management plan.   ? ? ?Ledora Bottcher, PA ? ?11/27/2021, 11:02 AM ?

## 2021-12-07 DIAGNOSIS — H57813 Brow ptosis, bilateral: Secondary | ICD-10-CM | POA: Diagnosis not present

## 2021-12-07 DIAGNOSIS — H02831 Dermatochalasis of right upper eyelid: Secondary | ICD-10-CM | POA: Diagnosis not present

## 2021-12-07 DIAGNOSIS — H02413 Mechanical ptosis of bilateral eyelids: Secondary | ICD-10-CM | POA: Diagnosis not present

## 2021-12-07 DIAGNOSIS — H53483 Generalized contraction of visual field, bilateral: Secondary | ICD-10-CM | POA: Diagnosis not present

## 2021-12-07 DIAGNOSIS — H02423 Myogenic ptosis of bilateral eyelids: Secondary | ICD-10-CM | POA: Diagnosis not present

## 2021-12-07 DIAGNOSIS — H53453 Other localized visual field defect, bilateral: Secondary | ICD-10-CM | POA: Diagnosis not present

## 2021-12-07 DIAGNOSIS — H02834 Dermatochalasis of left upper eyelid: Secondary | ICD-10-CM | POA: Diagnosis not present

## 2021-12-08 ENCOUNTER — Telehealth: Payer: Self-pay | Admitting: Cardiology

## 2021-12-08 NOTE — Telephone Encounter (Signed)
Patient just had surgery yesterday on his eyes lids yesterday.  He wants to know when he should start back taking his aspirin and warfarin again.  ?

## 2021-12-08 NOTE — Telephone Encounter (Signed)
Left message to call back ? ?Per pre op note:  ?Per pharmacy recommendation, patient can hold warfarin for 5 days prior to procedure, patient will not need bridging with Lovenox around procedure. Additionally, per Dr. Stanford Breed, patient may hold ASA for 5 to 7 days prior to procedure with recommendation to resume warfarin and aspirin as soon as possible postprocedure, at the discretion of the surgeon. ?  ? ? ?

## 2021-12-09 DIAGNOSIS — I482 Chronic atrial fibrillation, unspecified: Secondary | ICD-10-CM | POA: Diagnosis not present

## 2021-12-09 DIAGNOSIS — Z7901 Long term (current) use of anticoagulants: Secondary | ICD-10-CM | POA: Diagnosis not present

## 2021-12-09 DIAGNOSIS — Z5181 Encounter for therapeutic drug level monitoring: Secondary | ICD-10-CM | POA: Diagnosis not present

## 2021-12-10 NOTE — Telephone Encounter (Signed)
Lm to call back ./cy 

## 2021-12-11 NOTE — Telephone Encounter (Signed)
Left message for pt to call.

## 2021-12-14 DIAGNOSIS — Z5181 Encounter for therapeutic drug level monitoring: Secondary | ICD-10-CM | POA: Diagnosis not present

## 2021-12-14 DIAGNOSIS — E785 Hyperlipidemia, unspecified: Secondary | ICD-10-CM | POA: Diagnosis not present

## 2021-12-14 DIAGNOSIS — I482 Chronic atrial fibrillation, unspecified: Secondary | ICD-10-CM | POA: Diagnosis not present

## 2021-12-14 DIAGNOSIS — Z7901 Long term (current) use of anticoagulants: Secondary | ICD-10-CM | POA: Diagnosis not present

## 2021-12-15 NOTE — Telephone Encounter (Signed)
Left detailed message for the patient about restarting warfarin. ?

## 2021-12-18 DIAGNOSIS — Z7901 Long term (current) use of anticoagulants: Secondary | ICD-10-CM | POA: Diagnosis not present

## 2021-12-18 DIAGNOSIS — I482 Chronic atrial fibrillation, unspecified: Secondary | ICD-10-CM | POA: Diagnosis not present

## 2021-12-18 DIAGNOSIS — Z5181 Encounter for therapeutic drug level monitoring: Secondary | ICD-10-CM | POA: Diagnosis not present

## 2021-12-25 DIAGNOSIS — E1129 Type 2 diabetes mellitus with other diabetic kidney complication: Secondary | ICD-10-CM | POA: Diagnosis not present

## 2021-12-25 DIAGNOSIS — E785 Hyperlipidemia, unspecified: Secondary | ICD-10-CM | POA: Diagnosis not present

## 2021-12-31 DIAGNOSIS — L821 Other seborrheic keratosis: Secondary | ICD-10-CM | POA: Diagnosis not present

## 2021-12-31 DIAGNOSIS — L578 Other skin changes due to chronic exposure to nonionizing radiation: Secondary | ICD-10-CM | POA: Diagnosis not present

## 2021-12-31 DIAGNOSIS — L570 Actinic keratosis: Secondary | ICD-10-CM | POA: Diagnosis not present

## 2022-01-04 DIAGNOSIS — E1159 Type 2 diabetes mellitus with other circulatory complications: Secondary | ICD-10-CM | POA: Diagnosis not present

## 2022-01-04 DIAGNOSIS — E785 Hyperlipidemia, unspecified: Secondary | ICD-10-CM | POA: Diagnosis not present

## 2022-01-04 DIAGNOSIS — I482 Chronic atrial fibrillation, unspecified: Secondary | ICD-10-CM | POA: Diagnosis not present

## 2022-01-04 DIAGNOSIS — I10 Essential (primary) hypertension: Secondary | ICD-10-CM | POA: Diagnosis not present

## 2022-01-06 NOTE — Progress Notes (Unsigned)
Office Visit    Patient Name: Jack Farmer Date of Encounter: 01/07/2022  Primary Care Provider:  Philmore Pali, NP Primary Cardiologist:  Kirk Ruths, MD  Chief Complaint    81 year old male with a history of CAD, CKD stage III, atrial flutter and atrial fibrillation s/p ablation in 2020, hypertension, hyperlipidemia, type 2 diabetes and BPH who presents for follow-up related to CAD and atrial fibrillation.    Past Medical History    Past Medical History:  Diagnosis Date   Arthritis    CAD (coronary artery disease) 05/03/2018   moderate CAD, normal LVF- medical rx   Frequent urination    GERD (gastroesophageal reflux disease)    Hyperlipidemia    Hypertension 05/03/2018   normal LVF, moderate LVH, grade 2 DD   Hypertrophy of prostate with urinary obstruction and other lower urinary tract symptoms (LUTS)    Low back pain    Low HDL (under 40)    Shortness of breath dyspnea    with ambulation   Skin cancer    removed from face   Past Surgical History:  Procedure Laterality Date   ANTERIOR LAT LUMBAR FUSION  04/07/2012   Procedure: ANTERIOR LATERAL LUMBAR FUSION 2 LEVELS;  Surgeon: Eustace Moore, MD;  Location: [facility];  Service: Neurosurgery;  Laterality: Right;  Lumbar three-four and four-five extreme lumbar interbody fusion   BACK SURGERY     CARDIAC CATHETERIZATION     COLONOSCOPY W/ POLYPECTOMY     EYE SURGERY     tightened muscles in right eye   LEFT HEART CATH AND CORONARY ANGIOGRAPHY N/A 05/03/2018   Procedure: LEFT HEART CATH AND CORONARY ANGIOGRAPHY;  Surgeon: Belva Crome, MD;  Location: [place] [facility];  Service: Cardiovascular;  Laterality: N/A;   TONSILLECTOMY      Allergies  Allergies  Allergen Reactions   Cephalexin Anaphylaxis   Indocin [Indomethacin] Other (See Comments)     Gi bleed    History of Present Illness    81 year old male with the above past medical history including CAD, CKD stage III, atrial flutter and atrial  fibrillation s/p ablation in 2020, hypertension, hyperlipidemia, type 2 diabetes, and BPH.    Cardiac catheterization in 2019 showed 30 to 40% disease in first diagonal, 60 to 70% disease in left circumflex artery, 30% OM, 50 to 60% RCA, 50 to 60% PDA, EF 60%. Medical therapy was recommended.  Echocardiogram at that time showed normal EF, moderate diastolic dysfunction, moderate LVH.  PYP scan in January 2020 was equivocal.  He was hospitalized at [facility] in January 2020 with atrial flutter and underwent TEE DCCV followed by ablation.  Recent echocardiogram in July 2022 showed EF 60 to 65%, normal LV function, mild LVH, grade 1 diastolic dysfunction, mild aortic valve sclerosis without evidence of aortic valve stenosis. He was last seen in the office by Almyra Deforest, PA, on 03/20/2021 and was stable from a cardiac standpoint. He had a virtual visit for surgical clearance with Fabian Sharp, PA on 11/27/2021 and was doing well at the time.   He presents today for follow-up. Since his last visit he has done well from a cardiac standpoint.  He denies any symptoms concerning for angina, denies dyspnea, recent palpitations.  He did have an episode of atrial fibrillation that occurred 1 month ago when he was at a wedding. He forgot to take his metoprolol with him while traveling, and missed 5 days worth of medication. His symptoms resolved  spontaneously and with resumption of metoprolol he has not had any further episodes. He is walking daily for exercise, and has lost a significant amount of weight eating a heart healthy diet.  Overall, he reports feeling well and denies any concerns today.  Home Medications    Current Outpatient Medications  Medication Sig Dispense Refill   amLODipine (NORVASC) 5 MG tablet Take 5 mg by mouth daily.     aspirin EC 81 MG tablet Take 81 mg by mouth daily.     atorvastatin (LIPITOR) 80 MG tablet Take 80 mg by mouth daily.     dapagliflozin propanediol (FARXIGA) 10 MG TABS tablet Take 1  tablet by mouth daily.     docusate sodium (COLACE) 100 MG capsule Take 100 mg by mouth 2 (two) times daily.     ezetimibe (ZETIA) 10 MG tablet Take 1 tablet (10 mg total) by mouth daily. 90 tablet 3   Flaxseed, Linseed, (FLAXSEED OIL PO) Take 1 Scoop by mouth daily.     furosemide (LASIX) 40 MG tablet Take 2 tablets (80 mg total) by mouth 2 (two) times daily. 360 tablet 3   insulin glargine (LANTUS SOLOSTAR) 100 UNIT/ML Solostar Pen Inject 30 Units into the skin every morning. And pen needles 1/day 30 mL PRN   losartan (COZAAR) 100 MG tablet Take 100 mg by mouth daily.     metoprolol tartrate (LOPRESSOR) 50 MG tablet Take 1 tablet (50 mg total) by mouth 2 (two) times daily. 180 tablet 3   Multiple Vitamins-Minerals (ONE-A-DAY MENS 50+ PO) Take 1 tablet by mouth daily.     multivitamin-lutein (OCUVITE-LUTEIN) CAPS capsule Take 1 capsule by mouth daily.     nitroGLYCERIN (NITROSTAT) 0.4 MG SL tablet Place 1 tablet (0.4 mg total) under the tongue every 5 (five) minutes x 3 doses as needed for chest pain. 25 tablet 3   omega-3 acid ethyl esters (LOVAZA) 1 g capsule Take 1 capsule (1 g total) by mouth 2 (two) times daily. 180 capsule 3   Semaglutide,0.25 or 0.'5MG'$ /DOS, (OZEMPIC, 0.25 OR 0.5 MG/DOSE,) 2 MG/1.5ML SOPN Inject 0.5 mg into the skin once a week. 4.5 mL 3   warfarin (COUMADIN) 3 MG tablet Take 4 mg by mouth daily.     No current facility-administered medications for this visit.     Review of Systems    He denies chest pain, palpitations, dyspnea, pnd, orthopnea, n, v, dizziness, syncope, edema, weight gain, or early satiety. All other systems reviewed and are otherwise negative except as noted above.   Physical Exam    VS:  BP 124/62   Pulse 75   Ht '6\' 3"'$  (1.905 m)   Wt 247 lb 6.4 oz (112.2 kg)   SpO2 100%   BMI 30.92 kg/m   GEN: Well nourished, well developed, in no acute distress. HEENT: normal. Neck: Supple, no JVD, carotid bruits, or masses. Cardiac: RRR, no murmurs, rubs,  or gallops. No clubbing, cyanosis, edema.  Radials/DP/PT 2+ and equal bilaterally.  Respiratory: Respirations regular and unlabored, clear to auscultation bilaterally. GI: Soft, nontender, nondistended, BS + x 4. MS: no deformity or atrophy. Skin: warm and dry, no rash. Neuro:  Strength and sensation are intact. Psych: Normal affect.  Accessory Clinical Findings    ECG personally reviewed by me today -NSR, 75 bpm, sinus arrhythmia, nonspecific ST/T wave changes- no acute changes.  Lab Results  Component Value Date   WBC 6.1 03/14/2020   HGB 12.6 (L) 03/14/2020   HCT 40.6 03/14/2020  MCV 86 03/14/2020   PLT 237 03/14/2020   Lab Results  Component Value Date   CREATININE 1.56 (H) 09/30/2020   BUN 22 09/30/2020   NA 143 09/30/2020   K 4.1 09/30/2020   CL 101 09/30/2020   CO2 23 09/30/2020   Lab Results  Component Value Date   ALT 27 05/02/2018   AST 20 05/02/2018   ALKPHOS 78 05/02/2018   BILITOT 0.9 05/02/2018   Lab Results  Component Value Date   CHOL 104 02/02/2021   HDL 25 (L) 02/02/2021   LDLCALC 32 02/02/2021   LDLDIRECT 46.0 10/26/2017   TRIG 313 (H) 02/02/2021   CHOLHDL 4.2 02/02/2021    Lab Results  Component Value Date   HGBA1C 10.4 (A) 04/01/2021    Assessment & Plan    1. CAD: Cath in 2019 showed 30 to 40% disease in first diagonal, 60 to 70% disease in left circumflex artery, 30% OM, 50 to 60% RCA, 50 to 60% PDA, EF 60%.  Medical therapy was recommended. Most recent echo in July 2022 showed EF 60 to 65%, normal LV function, mild LVH, grade 1 diastolic dysfunction, mild aortic valve sclerosis without evidence of aortic valve stenosis. Stable with no anginal symptoms. No indication for ischemic evaluation. Euvolemic and well compensated on exam. Continue aspirin, losartan, metoprolol, amlodipine, Lasix, Lipitor, and Zetia as below.  2. Persistent atrial flutter/atrial fibrillation: S/p TEE/DCCV followed by ablation.  Reports 1 episode of breakthrough  atrial fibrillation GERD approximately 1 month ago, while he was out of town attending a wedding.  He forgot to take his metoprolol with him and missed 5 days worth of medication.  He has not had any episodes since.  EKG today shows sinus rhythm with sinus arrhythmia, 75 bpm.  Denies bleeding on Coumadin. Continue Coumadin, metoprolol.  3. Hypertension: BP well controlled. Continue current antihypertensive regimen.   4. Hyperlipidemia: LDL was 75 in 08/2021, slightly above goal <70. Will add Zetia. Repeat lipids, LFTs in 6-8 weeks.   5. CKD stage III: Creatinine was 1.070 in 08/2021. Stable.   6. Type 2 diabetes: A1C was 6.2 in 08/2021.  Monitored and managed per PCP.  Continue Farxiga.  7. Disposition: Follow- up in 1 year.   Lenna Sciara, NP 01/07/2022, 9:25 AM

## 2022-01-07 ENCOUNTER — Encounter: Payer: Self-pay | Admitting: Nurse Practitioner

## 2022-01-07 ENCOUNTER — Other Ambulatory Visit: Payer: Self-pay

## 2022-01-07 ENCOUNTER — Ambulatory Visit: Payer: Medicare Other | Admitting: Nurse Practitioner

## 2022-01-07 VITALS — BP 124/62 | HR 75 | Ht 75.0 in | Wt 247.4 lb

## 2022-01-07 DIAGNOSIS — I4819 Other persistent atrial fibrillation: Secondary | ICD-10-CM

## 2022-01-07 DIAGNOSIS — I10 Essential (primary) hypertension: Secondary | ICD-10-CM

## 2022-01-07 DIAGNOSIS — I2510 Atherosclerotic heart disease of native coronary artery without angina pectoris: Secondary | ICD-10-CM

## 2022-01-07 DIAGNOSIS — E785 Hyperlipidemia, unspecified: Secondary | ICD-10-CM

## 2022-01-07 DIAGNOSIS — Z794 Long term (current) use of insulin: Secondary | ICD-10-CM

## 2022-01-07 DIAGNOSIS — Z79899 Other long term (current) drug therapy: Secondary | ICD-10-CM

## 2022-01-07 DIAGNOSIS — E119 Type 2 diabetes mellitus without complications: Secondary | ICD-10-CM | POA: Diagnosis not present

## 2022-01-07 DIAGNOSIS — N183 Chronic kidney disease, stage 3 unspecified: Secondary | ICD-10-CM

## 2022-01-07 MED ORDER — EZETIMIBE 10 MG PO TABS: 10.0000 mg | ORAL_TABLET | Freq: Every day | ORAL | 3 refills | Status: DC

## 2022-01-07 NOTE — Patient Instructions (Signed)
Medication Instructions:  START Zetia 10 mg daily   *If you need a refill on your cardiac medications before your next appointment, please call your pharmacy*   Lab Work: Your physician recommends that you return for lab work in 6-8 weeks Lipid panel LFT  If you have labs (blood work) drawn today and your tests are completely normal, you will receive your results only by: [place] (if you have MyChart) OR A paper copy in the mail If you have any lab test that is abnormal or we need to change your treatment, we will call you to review the results.   Testing/Procedures: NONE ordered at this time of appointment     Follow-Up: At [facility], you and your health needs are our priority.  As part of our continuing mission to provide you with exceptional heart care, we have created designated Provider Care Teams.  These Care Teams include your primary Cardiologist (physician) and Advanced Practice Providers (APPs -  Physician Assistants and Nurse Practitioners) who all work together to provide you with the care you need, when you need it.  We recommend signing up for the patient portal called "MyChart".  Sign up information is provided on this After Visit Summary.  MyChart is used to connect with patients for Virtual Visits (Telemedicine).  Patients are able to view lab/test results, encounter notes, upcoming appointments, etc.  Non-urgent messages can be sent to your provider as well.   To learn more about what you can do with MyChart, go to [email or web address].    Your next appointment:   1 year(s)  The format for your next appointment:   In Person  Provider:   Kirk Ruths, MD     Other Instructions   Important Information About Sugar

## 2022-01-14 DIAGNOSIS — E785 Hyperlipidemia, unspecified: Secondary | ICD-10-CM | POA: Diagnosis not present

## 2022-01-20 DIAGNOSIS — A059 Bacterial foodborne intoxication, unspecified: Secondary | ICD-10-CM | POA: Diagnosis not present

## 2022-01-29 DIAGNOSIS — Z5181 Encounter for therapeutic drug level monitoring: Secondary | ICD-10-CM | POA: Diagnosis not present

## 2022-01-29 DIAGNOSIS — Z7901 Long term (current) use of anticoagulants: Secondary | ICD-10-CM | POA: Diagnosis not present

## 2022-01-29 DIAGNOSIS — I482 Chronic atrial fibrillation, unspecified: Secondary | ICD-10-CM | POA: Diagnosis not present

## 2022-02-01 DIAGNOSIS — Z7901 Long term (current) use of anticoagulants: Secondary | ICD-10-CM | POA: Diagnosis not present

## 2022-02-01 DIAGNOSIS — Z5181 Encounter for therapeutic drug level monitoring: Secondary | ICD-10-CM | POA: Diagnosis not present

## 2022-02-01 DIAGNOSIS — I482 Chronic atrial fibrillation, unspecified: Secondary | ICD-10-CM | POA: Diagnosis not present

## 2022-02-01 DIAGNOSIS — E1159 Type 2 diabetes mellitus with other circulatory complications: Secondary | ICD-10-CM | POA: Diagnosis not present

## 2022-02-13 DIAGNOSIS — E1129 Type 2 diabetes mellitus with other diabetic kidney complication: Secondary | ICD-10-CM | POA: Diagnosis not present

## 2022-02-13 DIAGNOSIS — I10 Essential (primary) hypertension: Secondary | ICD-10-CM | POA: Diagnosis not present

## 2022-02-18 DIAGNOSIS — Z7901 Long term (current) use of anticoagulants: Secondary | ICD-10-CM | POA: Diagnosis not present

## 2022-02-18 DIAGNOSIS — I482 Chronic atrial fibrillation, unspecified: Secondary | ICD-10-CM | POA: Diagnosis not present

## 2022-03-03 DIAGNOSIS — I482 Chronic atrial fibrillation, unspecified: Secondary | ICD-10-CM | POA: Diagnosis not present

## 2022-03-03 DIAGNOSIS — Z7901 Long term (current) use of anticoagulants: Secondary | ICD-10-CM | POA: Diagnosis not present

## 2022-03-16 DIAGNOSIS — I10 Essential (primary) hypertension: Secondary | ICD-10-CM | POA: Diagnosis not present

## 2022-03-16 DIAGNOSIS — E1159 Type 2 diabetes mellitus with other circulatory complications: Secondary | ICD-10-CM | POA: Diagnosis not present

## 2022-03-17 DIAGNOSIS — Z7901 Long term (current) use of anticoagulants: Secondary | ICD-10-CM | POA: Diagnosis not present

## 2022-03-17 DIAGNOSIS — Z2889 Immunization not carried out for other reason: Secondary | ICD-10-CM | POA: Diagnosis not present

## 2022-03-17 DIAGNOSIS — Z0000 Encounter for general adult medical examination without abnormal findings: Secondary | ICD-10-CM | POA: Diagnosis not present

## 2022-03-17 DIAGNOSIS — I482 Chronic atrial fibrillation, unspecified: Secondary | ICD-10-CM | POA: Diagnosis not present

## 2022-04-12 DIAGNOSIS — E785 Hyperlipidemia, unspecified: Secondary | ICD-10-CM | POA: Diagnosis not present

## 2022-04-12 DIAGNOSIS — I10 Essential (primary) hypertension: Secondary | ICD-10-CM | POA: Diagnosis not present

## 2022-04-12 DIAGNOSIS — E1159 Type 2 diabetes mellitus with other circulatory complications: Secondary | ICD-10-CM | POA: Diagnosis not present

## 2022-04-16 DIAGNOSIS — I10 Essential (primary) hypertension: Secondary | ICD-10-CM | POA: Diagnosis not present

## 2022-04-16 DIAGNOSIS — E1159 Type 2 diabetes mellitus with other circulatory complications: Secondary | ICD-10-CM | POA: Diagnosis not present

## 2022-04-22 DIAGNOSIS — I482 Chronic atrial fibrillation, unspecified: Secondary | ICD-10-CM | POA: Diagnosis not present

## 2022-04-22 DIAGNOSIS — E1122 Type 2 diabetes mellitus with diabetic chronic kidney disease: Secondary | ICD-10-CM | POA: Diagnosis not present

## 2022-04-22 DIAGNOSIS — E1159 Type 2 diabetes mellitus with other circulatory complications: Secondary | ICD-10-CM | POA: Diagnosis not present

## 2022-04-22 DIAGNOSIS — E785 Hyperlipidemia, unspecified: Secondary | ICD-10-CM | POA: Diagnosis not present

## 2022-04-22 DIAGNOSIS — I509 Heart failure, unspecified: Secondary | ICD-10-CM | POA: Diagnosis not present

## 2022-05-03 DIAGNOSIS — E1159 Type 2 diabetes mellitus with other circulatory complications: Secondary | ICD-10-CM | POA: Diagnosis not present

## 2022-05-16 DIAGNOSIS — E1159 Type 2 diabetes mellitus with other circulatory complications: Secondary | ICD-10-CM | POA: Diagnosis not present

## 2022-05-16 DIAGNOSIS — I10 Essential (primary) hypertension: Secondary | ICD-10-CM | POA: Diagnosis not present

## 2022-05-20 DIAGNOSIS — I482 Chronic atrial fibrillation, unspecified: Secondary | ICD-10-CM | POA: Diagnosis not present

## 2022-05-20 DIAGNOSIS — Z5181 Encounter for therapeutic drug level monitoring: Secondary | ICD-10-CM | POA: Diagnosis not present

## 2022-05-31 DIAGNOSIS — Z20822 Contact with and (suspected) exposure to covid-19: Secondary | ICD-10-CM | POA: Diagnosis not present

## 2022-05-31 DIAGNOSIS — R053 Chronic cough: Secondary | ICD-10-CM | POA: Diagnosis not present

## 2022-06-16 DIAGNOSIS — E1122 Type 2 diabetes mellitus with diabetic chronic kidney disease: Secondary | ICD-10-CM | POA: Diagnosis not present

## 2022-06-16 DIAGNOSIS — I10 Essential (primary) hypertension: Secondary | ICD-10-CM | POA: Diagnosis not present

## 2022-06-21 DIAGNOSIS — I482 Chronic atrial fibrillation, unspecified: Secondary | ICD-10-CM | POA: Diagnosis not present

## 2022-07-16 DIAGNOSIS — E1122 Type 2 diabetes mellitus with diabetic chronic kidney disease: Secondary | ICD-10-CM | POA: Diagnosis not present

## 2022-07-16 DIAGNOSIS — I10 Essential (primary) hypertension: Secondary | ICD-10-CM | POA: Diagnosis not present

## 2022-07-22 DIAGNOSIS — I482 Chronic atrial fibrillation, unspecified: Secondary | ICD-10-CM | POA: Diagnosis not present

## 2022-07-22 DIAGNOSIS — E1159 Type 2 diabetes mellitus with other circulatory complications: Secondary | ICD-10-CM | POA: Diagnosis not present

## 2022-07-22 DIAGNOSIS — Z7901 Long term (current) use of anticoagulants: Secondary | ICD-10-CM | POA: Diagnosis not present

## 2022-07-22 DIAGNOSIS — Z5181 Encounter for therapeutic drug level monitoring: Secondary | ICD-10-CM | POA: Diagnosis not present

## 2022-07-22 DIAGNOSIS — I10 Essential (primary) hypertension: Secondary | ICD-10-CM | POA: Diagnosis not present

## 2022-07-22 DIAGNOSIS — E785 Hyperlipidemia, unspecified: Secondary | ICD-10-CM | POA: Diagnosis not present

## 2022-07-29 DIAGNOSIS — N183 Chronic kidney disease, stage 3 unspecified: Secondary | ICD-10-CM | POA: Diagnosis not present

## 2022-07-29 DIAGNOSIS — E1122 Type 2 diabetes mellitus with diabetic chronic kidney disease: Secondary | ICD-10-CM | POA: Diagnosis not present

## 2022-07-29 DIAGNOSIS — Z794 Long term (current) use of insulin: Secondary | ICD-10-CM | POA: Diagnosis not present

## 2022-07-29 DIAGNOSIS — E785 Hyperlipidemia, unspecified: Secondary | ICD-10-CM | POA: Diagnosis not present

## 2022-07-29 DIAGNOSIS — I10 Essential (primary) hypertension: Secondary | ICD-10-CM | POA: Diagnosis not present

## 2022-08-16 DIAGNOSIS — E785 Hyperlipidemia, unspecified: Secondary | ICD-10-CM | POA: Diagnosis not present

## 2022-08-16 DIAGNOSIS — E1159 Type 2 diabetes mellitus with other circulatory complications: Secondary | ICD-10-CM | POA: Diagnosis not present

## 2022-08-24 DIAGNOSIS — E119 Type 2 diabetes mellitus without complications: Secondary | ICD-10-CM | POA: Diagnosis not present

## 2022-08-24 DIAGNOSIS — Z0000 Encounter for general adult medical examination without abnormal findings: Secondary | ICD-10-CM | POA: Diagnosis not present

## 2022-08-24 DIAGNOSIS — Z139 Encounter for screening, unspecified: Secondary | ICD-10-CM | POA: Diagnosis not present

## 2022-08-24 DIAGNOSIS — Z136 Encounter for screening for cardiovascular disorders: Secondary | ICD-10-CM | POA: Diagnosis not present

## 2022-08-24 DIAGNOSIS — Z794 Long term (current) use of insulin: Secondary | ICD-10-CM | POA: Diagnosis not present

## 2022-08-24 DIAGNOSIS — Z7901 Long term (current) use of anticoagulants: Secondary | ICD-10-CM | POA: Diagnosis not present

## 2022-08-24 DIAGNOSIS — Z1339 Encounter for screening examination for other mental health and behavioral disorders: Secondary | ICD-10-CM | POA: Diagnosis not present

## 2022-08-24 DIAGNOSIS — Z1389 Encounter for screening for other disorder: Secondary | ICD-10-CM | POA: Diagnosis not present

## 2022-08-24 DIAGNOSIS — I482 Chronic atrial fibrillation, unspecified: Secondary | ICD-10-CM | POA: Diagnosis not present

## 2022-08-24 DIAGNOSIS — Z720 Tobacco use: Secondary | ICD-10-CM | POA: Diagnosis not present

## 2022-08-24 DIAGNOSIS — Z1331 Encounter for screening for depression: Secondary | ICD-10-CM | POA: Diagnosis not present

## 2022-08-24 DIAGNOSIS — F17210 Nicotine dependence, cigarettes, uncomplicated: Secondary | ICD-10-CM | POA: Diagnosis not present

## 2022-09-21 DIAGNOSIS — Z7901 Long term (current) use of anticoagulants: Secondary | ICD-10-CM | POA: Diagnosis not present

## 2022-09-21 DIAGNOSIS — Z5181 Encounter for therapeutic drug level monitoring: Secondary | ICD-10-CM | POA: Diagnosis not present

## 2022-09-21 DIAGNOSIS — Z6832 Body mass index (BMI) 32.0-32.9, adult: Secondary | ICD-10-CM | POA: Diagnosis not present

## 2022-09-21 DIAGNOSIS — I482 Chronic atrial fibrillation, unspecified: Secondary | ICD-10-CM | POA: Diagnosis not present

## 2022-09-21 DIAGNOSIS — R3912 Poor urinary stream: Secondary | ICD-10-CM | POA: Diagnosis not present

## 2022-10-05 DIAGNOSIS — Z7901 Long term (current) use of anticoagulants: Secondary | ICD-10-CM | POA: Diagnosis not present

## 2022-10-05 DIAGNOSIS — I482 Chronic atrial fibrillation, unspecified: Secondary | ICD-10-CM | POA: Diagnosis not present

## 2022-10-05 DIAGNOSIS — Z5181 Encounter for therapeutic drug level monitoring: Secondary | ICD-10-CM | POA: Diagnosis not present

## 2022-10-05 DIAGNOSIS — Z6832 Body mass index (BMI) 32.0-32.9, adult: Secondary | ICD-10-CM | POA: Diagnosis not present

## 2022-10-15 DIAGNOSIS — I482 Chronic atrial fibrillation, unspecified: Secondary | ICD-10-CM | POA: Diagnosis not present

## 2022-10-15 DIAGNOSIS — I10 Essential (primary) hypertension: Secondary | ICD-10-CM | POA: Diagnosis not present

## 2022-10-19 DIAGNOSIS — Z794 Long term (current) use of insulin: Secondary | ICD-10-CM | POA: Diagnosis not present

## 2022-10-19 DIAGNOSIS — E1159 Type 2 diabetes mellitus with other circulatory complications: Secondary | ICD-10-CM | POA: Diagnosis not present

## 2022-10-19 DIAGNOSIS — I10 Essential (primary) hypertension: Secondary | ICD-10-CM | POA: Diagnosis not present

## 2022-10-19 DIAGNOSIS — Z6831 Body mass index (BMI) 31.0-31.9, adult: Secondary | ICD-10-CM | POA: Diagnosis not present

## 2022-10-19 DIAGNOSIS — I482 Chronic atrial fibrillation, unspecified: Secondary | ICD-10-CM | POA: Diagnosis not present

## 2022-10-25 DIAGNOSIS — Z1331 Encounter for screening for depression: Secondary | ICD-10-CM | POA: Diagnosis not present

## 2022-10-25 DIAGNOSIS — R5383 Other fatigue: Secondary | ICD-10-CM | POA: Diagnosis not present

## 2022-10-25 DIAGNOSIS — Z6832 Body mass index (BMI) 32.0-32.9, adult: Secondary | ICD-10-CM | POA: Diagnosis not present

## 2022-10-25 DIAGNOSIS — E11649 Type 2 diabetes mellitus with hypoglycemia without coma: Secondary | ICD-10-CM | POA: Diagnosis not present

## 2022-10-26 DIAGNOSIS — I10 Essential (primary) hypertension: Secondary | ICD-10-CM | POA: Diagnosis not present

## 2022-10-26 DIAGNOSIS — I491 Atrial premature depolarization: Secondary | ICD-10-CM | POA: Diagnosis not present

## 2022-10-26 DIAGNOSIS — R000 Tachycardia, unspecified: Secondary | ICD-10-CM | POA: Diagnosis not present

## 2022-10-26 DIAGNOSIS — Z6832 Body mass index (BMI) 32.0-32.9, adult: Secondary | ICD-10-CM | POA: Diagnosis not present

## 2022-10-26 DIAGNOSIS — N179 Acute kidney failure, unspecified: Secondary | ICD-10-CM | POA: Diagnosis not present

## 2022-10-26 DIAGNOSIS — I482 Chronic atrial fibrillation, unspecified: Secondary | ICD-10-CM | POA: Diagnosis not present

## 2022-10-26 DIAGNOSIS — I509 Heart failure, unspecified: Secondary | ICD-10-CM | POA: Diagnosis not present

## 2022-10-26 DIAGNOSIS — I444 Left anterior fascicular block: Secondary | ICD-10-CM | POA: Diagnosis not present

## 2022-10-26 DIAGNOSIS — R9431 Abnormal electrocardiogram [ECG] [EKG]: Secondary | ICD-10-CM | POA: Diagnosis not present

## 2022-10-26 LAB — LAB REPORT - SCANNED: EGFR: 41

## 2022-10-27 DIAGNOSIS — Z7984 Long term (current) use of oral hypoglycemic drugs: Secondary | ICD-10-CM | POA: Diagnosis not present

## 2022-10-27 DIAGNOSIS — I130 Hypertensive heart and chronic kidney disease with heart failure and stage 1 through stage 4 chronic kidney disease, or unspecified chronic kidney disease: Secondary | ICD-10-CM | POA: Diagnosis not present

## 2022-10-27 DIAGNOSIS — I491 Atrial premature depolarization: Secondary | ICD-10-CM | POA: Diagnosis not present

## 2022-10-27 DIAGNOSIS — Z79899 Other long term (current) drug therapy: Secondary | ICD-10-CM | POA: Diagnosis not present

## 2022-10-27 DIAGNOSIS — Z7985 Long-term (current) use of injectable non-insulin antidiabetic drugs: Secondary | ICD-10-CM | POA: Diagnosis not present

## 2022-10-27 DIAGNOSIS — I509 Heart failure, unspecified: Secondary | ICD-10-CM | POA: Diagnosis not present

## 2022-10-27 DIAGNOSIS — I4892 Unspecified atrial flutter: Secondary | ICD-10-CM | POA: Diagnosis not present

## 2022-10-27 DIAGNOSIS — R000 Tachycardia, unspecified: Secondary | ICD-10-CM | POA: Diagnosis not present

## 2022-10-27 DIAGNOSIS — I4891 Unspecified atrial fibrillation: Secondary | ICD-10-CM | POA: Diagnosis not present

## 2022-10-27 DIAGNOSIS — I10 Essential (primary) hypertension: Secondary | ICD-10-CM | POA: Diagnosis not present

## 2022-10-27 DIAGNOSIS — F17210 Nicotine dependence, cigarettes, uncomplicated: Secondary | ICD-10-CM | POA: Diagnosis not present

## 2022-10-27 DIAGNOSIS — E785 Hyperlipidemia, unspecified: Secondary | ICD-10-CM | POA: Diagnosis not present

## 2022-10-27 DIAGNOSIS — N179 Acute kidney failure, unspecified: Secondary | ICD-10-CM | POA: Diagnosis not present

## 2022-10-27 DIAGNOSIS — R9431 Abnormal electrocardiogram [ECG] [EKG]: Secondary | ICD-10-CM | POA: Diagnosis not present

## 2022-10-27 DIAGNOSIS — E1122 Type 2 diabetes mellitus with diabetic chronic kidney disease: Secondary | ICD-10-CM | POA: Diagnosis not present

## 2022-10-27 DIAGNOSIS — N189 Chronic kidney disease, unspecified: Secondary | ICD-10-CM | POA: Diagnosis not present

## 2022-10-27 DIAGNOSIS — I444 Left anterior fascicular block: Secondary | ICD-10-CM | POA: Diagnosis not present

## 2022-10-27 DIAGNOSIS — Z7901 Long term (current) use of anticoagulants: Secondary | ICD-10-CM | POA: Diagnosis not present

## 2022-11-01 NOTE — Progress Notes (Unsigned)
HPI: FU coronary artery disease. Abdominal ultrasound in August of 2010 showed no aneurysm.  Last cardiac catheterization September 2019 showed 30 to 40% first diagonal, 60 to 70% circumflex, 50% obtuse marginal, 50 to 60% RCA, 50 to 60% PDA and ejection fraction 60%.  Left ventricular end-diastolic pressure 18.  Medical therapy recommended. PYP scan January 2020 equivocal.  Patient was admitted to [facility] January 2020 with atrial flutter.  He had TEE guided cardioversion followed by ablation.  Transthoracic echocardiogram showed normal LV function but was technically difficult.  TEE showed mild LV dysfunction.  Follow-up limited echocardiogram May 2020 technically difficult but LV function felt to be normal.   Patient had atrial fibrillation ablation August 2020.  Echocardiogram July 2022 showed normal LV function, mild left ventricular hypertrophy, grade 1 diastolic dysfunction.  Since last seen,   Current Outpatient Medications  Medication Sig Dispense Refill   amLODipine (NORVASC) 5 MG tablet Take 5 mg by mouth daily.     aspirin EC 81 MG tablet Take 81 mg by mouth daily.     atorvastatin (LIPITOR) 80 MG tablet Take 80 mg by mouth daily.     dapagliflozin propanediol (FARXIGA) 10 MG TABS tablet Take 1 tablet by mouth daily.     docusate sodium (COLACE) 100 MG capsule Take 100 mg by mouth 2 (two) times daily.     ezetimibe (ZETIA) 10 MG tablet Take 1 tablet (10 mg total) by mouth daily. 90 tablet 3   Flaxseed, Linseed, (FLAXSEED OIL PO) Take 1 Scoop by mouth daily.     furosemide (LASIX) 40 MG tablet Take 2 tablets (80 mg total) by mouth 2 (two) times daily. 360 tablet 3   insulin glargine (LANTUS SOLOSTAR) 100 UNIT/ML Solostar Pen Inject 30 Units into the skin every morning. And pen needles 1/day 30 mL PRN   losartan (COZAAR) 100 MG tablet Take 100 mg by mouth daily.     metoprolol tartrate (LOPRESSOR) 50 MG tablet Take 1 tablet (50 mg total) by mouth 2 (two) times daily. 180 tablet 3    Multiple Vitamins-Minerals (ONE-A-DAY MENS 50+ PO) Take 1 tablet by mouth daily.     multivitamin-lutein (OCUVITE-LUTEIN) CAPS capsule Take 1 capsule by mouth daily.     nitroGLYCERIN (NITROSTAT) 0.4 MG SL tablet Place 1 tablet (0.4 mg total) under the tongue every 5 (five) minutes x 3 doses as needed for chest pain. 25 tablet 3   omega-3 acid ethyl esters (LOVAZA) 1 g capsule Take 1 capsule (1 g total) by mouth 2 (two) times daily. 180 capsule 3   Semaglutide,0.25 or 0.5MG /DOS, (OZEMPIC, 0.25 OR 0.5 MG/DOSE,) 2 MG/1.5ML SOPN Inject 0.5 mg into the skin once a week. 4.5 mL 3   warfarin (COUMADIN) 3 MG tablet Take 4 mg by mouth daily.     No current facility-administered medications for this visit.     Past Medical History:  Diagnosis Date   Arthritis    CAD (coronary artery disease) 05/03/2018   moderate CAD, normal LVF- medical rx   Frequent urination    GERD (gastroesophageal reflux disease)    Hyperlipidemia    Hypertension 05/03/2018   normal LVF, moderate LVH, grade 2 DD   Hypertrophy of prostate with urinary obstruction and other lower urinary tract symptoms (LUTS)    Low back pain    Low HDL (under 40)    Shortness of breath dyspnea    with ambulation   Skin cancer    removed from face  Past Surgical History:  Procedure Laterality Date   ANTERIOR LAT LUMBAR FUSION  04/07/2012   Procedure: ANTERIOR LATERAL LUMBAR FUSION 2 LEVELS;  Surgeon: Eustace Moore, MD;  Location: [place] [facility];  Service: Neurosurgery;  Laterality: Right;  Lumbar three-four and four-five extreme lumbar interbody fusion   BACK SURGERY     CARDIAC CATHETERIZATION     COLONOSCOPY W/ POLYPECTOMY     EYE SURGERY     tightened muscles in right eye   LEFT HEART CATH AND CORONARY ANGIOGRAPHY N/A 05/03/2018   Procedure: LEFT HEART CATH AND CORONARY ANGIOGRAPHY;  Surgeon: Belva Crome, MD;  Location: [place] [facility];  Service: Cardiovascular;  Laterality: N/A;   TONSILLECTOMY      Social History    Socioeconomic History   Marital status: Married    Spouse name: Not on file   Number of children: 4   Years of education: Not on file   Highest education level: Not on file  Occupational History   Not on file  Tobacco Use   Smoking status: Some Days    Types: Cigarettes    Last attempt to quit: 08/16/2006    Years since quitting: 16.2   Smokeless tobacco: Never  Substance and Sexual Activity   Alcohol use: Yes    Comment: Rare   Drug use: No   Sexual activity: Yes  Other Topics Concern   Not on file  Social History Narrative   Not on file   Social Determinants of Health   Financial Resource Strain: Not on file  Food Insecurity: Not on file  Transportation Needs: Not on file  Physical Activity: Not on file  Stress: Not on file  Social Connections: Not on file  Intimate Partner Violence: Not on file    Family History  Problem Relation Age of Onset   Alcohol abuse Other    Drug abuse Other    Arthritis Other    Hypertension Other    Diabetes Neg Hx     ROS: no fevers or chills, productive cough, hemoptysis, dysphasia, odynophagia, melena, hematochezia, dysuria, hematuria, rash, seizure activity, orthopnea, PND, pedal edema, claudication. Remaining systems are negative.  Physical Exam: Well-developed well-nourished in no acute distress.  Skin is warm and dry.  HEENT is normal.  Neck is supple.  Chest is clear to auscultation with normal expansion.  Cardiovascular exam is regular rate and rhythm.  Abdominal exam nontender or distended. No masses palpated. Extremities show no edema. neuro grossly intact  ECG- personally reviewed  A/P  1 coronary artery disease-moderate on previous evaluation.  Continue statin.  2 paroxysmal atrial fibrillation/flutter-status post ablation-continue Coumadin with goal INR 2-3.  3 chronic diastolic congestive heart failure-continue Lasix at present dose.  Continue Farxiga.  4 hypertension-blood pressure controlled.   Continue present medications.  5 hyperlipidemia-continue statin.  6 chronic stage III kidney disease-Per nephrology.  Kirk Ruths, MD

## 2022-11-02 DIAGNOSIS — I482 Chronic atrial fibrillation, unspecified: Secondary | ICD-10-CM | POA: Diagnosis not present

## 2022-11-02 DIAGNOSIS — Z6831 Body mass index (BMI) 31.0-31.9, adult: Secondary | ICD-10-CM | POA: Diagnosis not present

## 2022-11-02 DIAGNOSIS — Z7689 Persons encountering health services in other specified circumstances: Secondary | ICD-10-CM | POA: Diagnosis not present

## 2022-11-02 DIAGNOSIS — I10 Essential (primary) hypertension: Secondary | ICD-10-CM | POA: Diagnosis not present

## 2022-11-03 ENCOUNTER — Ambulatory Visit: Payer: PPO | Attending: Cardiology | Admitting: Cardiology

## 2022-11-03 ENCOUNTER — Encounter: Payer: Self-pay | Admitting: Cardiology

## 2022-11-03 VITALS — BP 136/68 | HR 73 | Ht 75.0 in | Wt 235.0 lb

## 2022-11-03 DIAGNOSIS — I10 Essential (primary) hypertension: Secondary | ICD-10-CM | POA: Diagnosis not present

## 2022-11-03 DIAGNOSIS — I2510 Atherosclerotic heart disease of native coronary artery without angina pectoris: Secondary | ICD-10-CM

## 2022-11-03 DIAGNOSIS — I4819 Other persistent atrial fibrillation: Secondary | ICD-10-CM

## 2022-11-03 DIAGNOSIS — E785 Hyperlipidemia, unspecified: Secondary | ICD-10-CM

## 2022-11-03 MED ORDER — APIXABAN 2.5 MG PO TABS: 2.5000 mg | ORAL_TABLET | Freq: Two times a day (BID) | ORAL | 11 refills | Status: DC

## 2022-11-03 NOTE — Patient Instructions (Signed)
Medication Instructions:   STOP ASPIRIN  STOP WARFARIN IN 3 DAYS START ELIQUIS 2.5 MG ONE TABLET TWICE DAILY  *If you need a refill on your cardiac medications before your next appointment, please call your pharmacy*   Testing/Procedures:  Your physician has requested that you have an echocardiogram. Echocardiography is a painless test that uses sound waves to create images of your heart. It provides your doctor with information about the size and shape of your heart and how well your heart's chambers and valves are working. This procedure takes approximately one hour. There are no restrictions for this procedure. Please do NOT wear cologne, perfume, aftershave, or lotions (deodorant is allowed). Please arrive 15 minutes prior to your appointment time. [facility]-1ST FLOOR IMAGING DEPARTMENT  Follow-Up: At [facility], you and your health needs are our priority.  As part of our continuing mission to provide you with exceptional heart care, we have created designated Provider Care Teams.  These Care Teams include your primary Cardiologist (physician) and Advanced Practice Providers (APPs -  Physician Assistants and Nurse Practitioners) who all work together to provide you with the care you need, when you need it.  We recommend signing up for the patient portal called "MyChart".  Sign up information is provided on this After Visit Summary.  MyChart is used to connect with patients for Virtual Visits (Telemedicine).  Patients are able to view lab/test results, encounter notes, upcoming appointments, etc.  Non-urgent messages can be sent to your provider as well.   To learn more about what you can do with MyChart, go to [email or web address].    Your next appointment:   3 month(s)  Provider:   Kirk Ruths, MD

## 2022-11-15 DIAGNOSIS — I10 Essential (primary) hypertension: Secondary | ICD-10-CM | POA: Diagnosis not present

## 2022-11-15 DIAGNOSIS — I482 Chronic atrial fibrillation, unspecified: Secondary | ICD-10-CM | POA: Diagnosis not present

## 2022-11-15 DIAGNOSIS — E11649 Type 2 diabetes mellitus with hypoglycemia without coma: Secondary | ICD-10-CM | POA: Diagnosis not present

## 2022-11-16 DIAGNOSIS — E785 Hyperlipidemia, unspecified: Secondary | ICD-10-CM | POA: Diagnosis not present

## 2022-11-16 DIAGNOSIS — E11649 Type 2 diabetes mellitus with hypoglycemia without coma: Secondary | ICD-10-CM | POA: Diagnosis not present

## 2022-11-16 DIAGNOSIS — I10 Essential (primary) hypertension: Secondary | ICD-10-CM | POA: Diagnosis not present

## 2022-11-18 ENCOUNTER — Ambulatory Visit (HOSPITAL_BASED_OUTPATIENT_CLINIC_OR_DEPARTMENT_OTHER)
Admission: RE | Admit: 2022-11-18 | Discharge: 2022-11-18 | Disposition: A | Discharge status: Home / Self Care | Payer: PPO | Source: Ambulatory Visit | Attending: Cardiology | Admitting: Cardiology

## 2022-11-18 DIAGNOSIS — I4819 Other persistent atrial fibrillation: Secondary | ICD-10-CM | POA: Insufficient documentation

## 2022-11-18 LAB — ECHOCARDIOGRAM COMPLETE
Area-P 1/2: 4.77 cm2
S' Lateral: 3.7 cm

## 2022-11-22 ENCOUNTER — Encounter: Payer: Self-pay | Admitting: *Deleted

## 2022-11-23 DIAGNOSIS — I10 Essential (primary) hypertension: Secondary | ICD-10-CM | POA: Diagnosis not present

## 2022-11-23 DIAGNOSIS — Z6831 Body mass index (BMI) 31.0-31.9, adult: Secondary | ICD-10-CM | POA: Diagnosis not present

## 2022-11-23 DIAGNOSIS — N183 Chronic kidney disease, stage 3 unspecified: Secondary | ICD-10-CM | POA: Diagnosis not present

## 2022-11-23 DIAGNOSIS — E1122 Type 2 diabetes mellitus with diabetic chronic kidney disease: Secondary | ICD-10-CM | POA: Diagnosis not present

## 2022-11-23 DIAGNOSIS — E1159 Type 2 diabetes mellitus with other circulatory complications: Secondary | ICD-10-CM | POA: Diagnosis not present

## 2022-11-23 DIAGNOSIS — D6869 Other thrombophilia: Secondary | ICD-10-CM | POA: Diagnosis not present

## 2022-11-23 DIAGNOSIS — E785 Hyperlipidemia, unspecified: Secondary | ICD-10-CM | POA: Diagnosis not present

## 2022-11-24 ENCOUNTER — Telehealth: Payer: Self-pay | Admitting: Cardiology

## 2022-11-24 NOTE — Telephone Encounter (Signed)
Patient is calling for his echo results 

## 2022-11-24 NOTE — Telephone Encounter (Signed)
Left message of results for pt  

## 2022-12-14 DIAGNOSIS — E119 Type 2 diabetes mellitus without complications: Secondary | ICD-10-CM | POA: Diagnosis not present

## 2022-12-15 DIAGNOSIS — I482 Chronic atrial fibrillation, unspecified: Secondary | ICD-10-CM | POA: Diagnosis not present

## 2022-12-15 DIAGNOSIS — E11649 Type 2 diabetes mellitus with hypoglycemia without coma: Secondary | ICD-10-CM | POA: Diagnosis not present

## 2022-12-15 DIAGNOSIS — I10 Essential (primary) hypertension: Secondary | ICD-10-CM | POA: Diagnosis not present

## 2023-01-03 ENCOUNTER — Other Ambulatory Visit: Payer: Self-pay | Admitting: *Deleted

## 2023-01-03 DIAGNOSIS — L821 Other seborrheic keratosis: Secondary | ICD-10-CM | POA: Diagnosis not present

## 2023-01-03 DIAGNOSIS — L578 Other skin changes due to chronic exposure to nonionizing radiation: Secondary | ICD-10-CM | POA: Diagnosis not present

## 2023-01-03 MED ORDER — EZETIMIBE 10 MG PO TABS: 10.0000 mg | ORAL_TABLET | Freq: Every day | ORAL | 3 refills | Status: DC

## 2023-01-12 ENCOUNTER — Ambulatory Visit: Payer: PPO | Admitting: Cardiology

## 2023-01-15 DIAGNOSIS — E11649 Type 2 diabetes mellitus with hypoglycemia without coma: Secondary | ICD-10-CM | POA: Diagnosis not present

## 2023-01-15 DIAGNOSIS — I10 Essential (primary) hypertension: Secondary | ICD-10-CM | POA: Diagnosis not present

## 2023-01-15 DIAGNOSIS — I482 Chronic atrial fibrillation, unspecified: Secondary | ICD-10-CM | POA: Diagnosis not present

## 2023-02-04 DIAGNOSIS — M79606 Pain in leg, unspecified: Secondary | ICD-10-CM | POA: Diagnosis not present

## 2023-02-04 DIAGNOSIS — E1159 Type 2 diabetes mellitus with other circulatory complications: Secondary | ICD-10-CM | POA: Diagnosis not present

## 2023-02-14 DIAGNOSIS — I482 Chronic atrial fibrillation, unspecified: Secondary | ICD-10-CM | POA: Diagnosis not present

## 2023-02-14 DIAGNOSIS — E1159 Type 2 diabetes mellitus with other circulatory complications: Secondary | ICD-10-CM | POA: Diagnosis not present

## 2023-02-14 DIAGNOSIS — I10 Essential (primary) hypertension: Secondary | ICD-10-CM | POA: Diagnosis not present

## 2023-02-14 NOTE — Progress Notes (Signed)
HPI: FU coronary artery disease, atrial fibrillation/flutter and CHF. Abdominal ultrasound in August of 2010 showed no aneurysm.  Last cardiac catheterization September 2019 showed 30 to 40% first diagonal, 60 to 70% circumflex, 50% obtuse marginal, 50 to 60% RCA, 50 to 60% PDA and ejection fraction 60%.  Left ventricular end-diastolic pressure 18.  Medical therapy recommended. PYP scan January 2020 equivocal.  Patient had atrial flutter ablation at [facility] January 2020. Patient had atrial fibrillation ablation August 2020.  Echocardiogram July 2022 showed normal LV function, mild left ventricular hypertrophy, grade 1 diastolic dysfunction.  Patient admitted to [facility] March 2024 with recurrent atrial flutter and congestive heart failure.  Patient was treated with amiodarone and converted to sinus rhythm.  He did have chest tightness but troponins were normal and tightness was felt secondary to atrial flutter with rapid ventricular response.  TSH was 2.16.  Echocardiogram April 2024 showed ejection fraction 60 to 65%, grade 1 diastolic dysfunction.  Since last seen, the patient denies any dyspnea on exertion, orthopnea, PND, pedal edema, palpitations, syncope or chest pain.   Current Outpatient Medications  Medication Sig Dispense Refill   apixaban (ELIQUIS) 2.5 MG TABS tablet Take 1 tablet (2.5 mg total) by mouth 2 (two) times daily. 60 tablet 11   atorvastatin (LIPITOR) 80 MG tablet Take 80 mg by mouth daily.     dapagliflozin propanediol (FARXIGA) 10 MG TABS tablet Take 1 tablet by mouth daily.     diltiazem (CARDIZEM LA) 120 MG 24 hr tablet Take 120 mg by mouth daily.     docusate sodium (COLACE) 100 MG capsule Take 100 mg by mouth 2 (two) times daily.     ezetimibe (ZETIA) 10 MG tablet Take 1 tablet (10 mg total) by mouth daily. 90 tablet 3   Flaxseed, Linseed, (FLAXSEED OIL PO) Take 1 Scoop by mouth daily.     furosemide (LASIX) 40 MG tablet Take 2 tablets (80 mg total) by mouth 2  (two) times daily. 360 tablet 3   Lactobacillus (PROBIOTIC ACIDOPHILUS PO) Take 1 capsule by mouth daily.     losartan (COZAAR) 50 MG tablet Take 50 mg by mouth daily.     Metoprolol Tartrate 75 MG TABS Take 75 mg by mouth daily.     Multiple Vitamins-Minerals (ONE-A-DAY MENS 50+ PO) Take 1 tablet by mouth daily.     multivitamin-lutein (OCUVITE-LUTEIN) CAPS capsule Take 1 capsule by mouth daily.     nitroGLYCERIN (NITROSTAT) 0.4 MG SL tablet Place 1 tablet (0.4 mg total) under the tongue every 5 (five) minutes x 3 doses as needed for chest pain. 25 tablet 3   omega-3 acid ethyl esters (LOVAZA) 1 g capsule Take 1 capsule (1 g total) by mouth 2 (two) times daily. 180 capsule 3   Omega-3 Fatty Acids (OMEGA 3 PO) Take 1 capsule by mouth daily.     OVER THE COUNTER MEDICATION Balance of nature whole produce veggies 3 capsules in the AM     OVER THE COUNTER MEDICATION Balance of nature whole produce fruits 3 capsules in the AM     polyethylene glycol (MIRALAX / GLYCOLAX) 17 g packet Take 17 g by mouth daily. 1/2 cap full in the AM     Semaglutide,0.25 or 0.5MG /DOS, (OZEMPIC, 0.25 OR 0.5 MG/DOSE,) 2 MG/1.5ML SOPN Inject 0.5 mg into the skin once a week. 4.5 mL 3   No current facility-administered medications for this visit.     Past Medical History:  Diagnosis Date  Arthritis    CAD (coronary artery disease) 05/03/2018   moderate CAD, normal LVF- medical rx   Frequent urination    GERD (gastroesophageal reflux disease)    Hyperlipidemia    Hypertension 05/03/2018   normal LVF, moderate LVH, grade 2 DD   Hypertrophy of prostate with urinary obstruction and other lower urinary tract symptoms (LUTS)    Low back pain    Low HDL (under 40)    Shortness of breath dyspnea    with ambulation   Skin cancer    removed from face    Past Surgical History:  Procedure Laterality Date   ANTERIOR LAT LUMBAR FUSION  04/07/2012   Procedure: ANTERIOR LATERAL LUMBAR FUSION 2 LEVELS;  Surgeon: Tia Alert, MD;  Location: [facility];  Service: Neurosurgery;  Laterality: Right;  Lumbar three-four and four-five extreme lumbar interbody fusion   BACK SURGERY     CARDIAC CATHETERIZATION     COLONOSCOPY W/ POLYPECTOMY     EYE SURGERY     tightened muscles in right eye   LEFT HEART CATH AND CORONARY ANGIOGRAPHY N/A 05/03/2018   Procedure: LEFT HEART CATH AND CORONARY ANGIOGRAPHY;  Surgeon: Lyn Records, MD;  Location: [facility] INVASIVE CV LAB;  Service: Cardiovascular;  Laterality: N/A;   TONSILLECTOMY      Social History   Socioeconomic History   Marital status: Married    Spouse name: Not on file   Number of children: 4   Years of education: Not on file   Highest education level: Not on file  Occupational History   Not on file  Tobacco Use   Smoking status: Some Days    Types: Cigarettes    Last attempt to quit: 08/16/2006    Years since quitting: 16.5   Smokeless tobacco: Never  Substance and Sexual Activity   Alcohol use: Yes    Comment: Rare   Drug use: No   Sexual activity: Yes  Other Topics Concern   Not on file  Social History Narrative   Not on file   Social Determinants of Health   Financial Resource Strain: Not on file  Food Insecurity: Not on file  Transportation Needs: Not on file  Physical Activity: Not on file  Stress: Not on file  Social Connections: Not on file  Intimate Partner Violence: Not on file    Family History  Problem Relation Age of Onset   Alcohol abuse Other    Drug abuse Other    Arthritis Other    Hypertension Other    Diabetes Neg Hx     ROS: no fevers or chills, productive cough, hemoptysis, dysphasia, odynophagia, melena, hematochezia, dysuria, hematuria, rash, seizure activity, orthopnea, PND, pedal edema, claudication. Remaining systems are negative.  Physical Exam: Well-developed well-nourished in no acute distress.  Skin is warm and dry.  HEENT is normal.  Neck is supple.  Chest is clear to auscultation with normal  expansion.  Cardiovascular exam is regular rate and rhythm.  Abdominal exam nontender or distended. No masses palpated. Extremities show no edema. neuro grossly intact  A/P  1 coronary artery disease-noted to be moderate on previous evaluation.  Continue statin.  No aspirin given need for anticoagulation.  2 paroxysmal atrial fibrillation/flutter-patient has had previous ablation.  No recurrences since last office visit.  If he has more frequent episodes in the future we will consider antiarrhythmic such as amiodarone.  Continue apixaban.  3 hypertension-blood pressure controlled.  Continue present medical regimen.  4 hyperlipidemia-continue  statin.  5 chronic diastolic congestive heart failure-will continue Lasix and Farxiga at present dose.    6 chronic stage III kidney disease-managed by nephrology.  Olga Millers, MD

## 2023-02-23 ENCOUNTER — Encounter: Payer: Self-pay | Admitting: Cardiology

## 2023-02-23 ENCOUNTER — Ambulatory Visit: Payer: PPO | Attending: Cardiology | Admitting: Cardiology

## 2023-02-23 VITALS — BP 116/67 | HR 78 | Ht 75.0 in | Wt 236.0 lb

## 2023-02-23 DIAGNOSIS — I2510 Atherosclerotic heart disease of native coronary artery without angina pectoris: Secondary | ICD-10-CM

## 2023-02-23 NOTE — Patient Instructions (Signed)
    Follow-Up: At [facility], you and your health needs are our priority.  As part of our continuing mission to provide you with exceptional heart care, we have created designated Provider Care Teams.  These Care Teams include your primary Cardiologist (physician) and Advanced Practice Providers (APPs -  Physician Assistants and Nurse Practitioners) who all work together to provide you with the care you need, when you need it.  We recommend signing up for the patient portal called "MyChart".  Sign up information is provided on this After Visit Summary.  MyChart is used to connect with patients for Virtual Visits (Telemedicine).  Patients are able to view lab/test results, encounter notes, upcoming appointments, etc.  Non-urgent messages can be sent to your provider as well.   To learn more about what you can do with MyChart, go to [email or web address].    Your next appointment:   6 month(s)  Provider:   Brian Crenshaw, MD      

## 2023-03-17 DIAGNOSIS — E1159 Type 2 diabetes mellitus with other circulatory complications: Secondary | ICD-10-CM | POA: Diagnosis not present

## 2023-03-17 DIAGNOSIS — I10 Essential (primary) hypertension: Secondary | ICD-10-CM | POA: Diagnosis not present

## 2023-03-22 DIAGNOSIS — I10 Essential (primary) hypertension: Secondary | ICD-10-CM | POA: Diagnosis not present

## 2023-03-22 DIAGNOSIS — E785 Hyperlipidemia, unspecified: Secondary | ICD-10-CM | POA: Diagnosis not present

## 2023-03-22 DIAGNOSIS — E11649 Type 2 diabetes mellitus with hypoglycemia without coma: Secondary | ICD-10-CM | POA: Diagnosis not present

## 2023-03-31 DIAGNOSIS — E1122 Type 2 diabetes mellitus with diabetic chronic kidney disease: Secondary | ICD-10-CM | POA: Diagnosis not present

## 2023-03-31 DIAGNOSIS — E1159 Type 2 diabetes mellitus with other circulatory complications: Secondary | ICD-10-CM | POA: Diagnosis not present

## 2023-03-31 DIAGNOSIS — E785 Hyperlipidemia, unspecified: Secondary | ICD-10-CM | POA: Diagnosis not present

## 2023-03-31 DIAGNOSIS — I10 Essential (primary) hypertension: Secondary | ICD-10-CM | POA: Diagnosis not present

## 2023-04-17 DIAGNOSIS — I10 Essential (primary) hypertension: Secondary | ICD-10-CM | POA: Diagnosis not present

## 2023-04-17 DIAGNOSIS — E1159 Type 2 diabetes mellitus with other circulatory complications: Secondary | ICD-10-CM | POA: Diagnosis not present

## 2023-05-17 DIAGNOSIS — E785 Hyperlipidemia, unspecified: Secondary | ICD-10-CM | POA: Diagnosis not present

## 2023-05-17 DIAGNOSIS — I10 Essential (primary) hypertension: Secondary | ICD-10-CM | POA: Diagnosis not present

## 2023-05-17 DIAGNOSIS — E1159 Type 2 diabetes mellitus with other circulatory complications: Secondary | ICD-10-CM | POA: Diagnosis not present

## 2023-06-13 DIAGNOSIS — Z6831 Body mass index (BMI) 31.0-31.9, adult: Secondary | ICD-10-CM | POA: Diagnosis not present

## 2023-06-13 DIAGNOSIS — E11649 Type 2 diabetes mellitus with hypoglycemia without coma: Secondary | ICD-10-CM | POA: Diagnosis not present

## 2023-06-13 DIAGNOSIS — I509 Heart failure, unspecified: Secondary | ICD-10-CM | POA: Diagnosis not present

## 2023-06-13 DIAGNOSIS — I700 Atherosclerosis of aorta: Secondary | ICD-10-CM | POA: Diagnosis not present

## 2023-06-17 DIAGNOSIS — E785 Hyperlipidemia, unspecified: Secondary | ICD-10-CM | POA: Diagnosis not present

## 2023-06-17 DIAGNOSIS — I10 Essential (primary) hypertension: Secondary | ICD-10-CM | POA: Diagnosis not present

## 2023-06-17 DIAGNOSIS — E1159 Type 2 diabetes mellitus with other circulatory complications: Secondary | ICD-10-CM | POA: Diagnosis not present

## 2023-07-17 DIAGNOSIS — E785 Hyperlipidemia, unspecified: Secondary | ICD-10-CM | POA: Diagnosis not present

## 2023-07-17 DIAGNOSIS — E1159 Type 2 diabetes mellitus with other circulatory complications: Secondary | ICD-10-CM | POA: Diagnosis not present

## 2023-07-28 DIAGNOSIS — E11649 Type 2 diabetes mellitus with hypoglycemia without coma: Secondary | ICD-10-CM | POA: Diagnosis not present

## 2023-07-28 DIAGNOSIS — I10 Essential (primary) hypertension: Secondary | ICD-10-CM | POA: Diagnosis not present

## 2023-07-28 DIAGNOSIS — E785 Hyperlipidemia, unspecified: Secondary | ICD-10-CM | POA: Diagnosis not present

## 2023-08-08 DIAGNOSIS — I10 Essential (primary) hypertension: Secondary | ICD-10-CM | POA: Diagnosis not present

## 2023-08-08 DIAGNOSIS — Z6832 Body mass index (BMI) 32.0-32.9, adult: Secondary | ICD-10-CM | POA: Diagnosis not present

## 2023-08-08 DIAGNOSIS — E1122 Type 2 diabetes mellitus with diabetic chronic kidney disease: Secondary | ICD-10-CM | POA: Diagnosis not present

## 2023-08-08 DIAGNOSIS — N183 Chronic kidney disease, stage 3 unspecified: Secondary | ICD-10-CM | POA: Diagnosis not present

## 2023-08-08 DIAGNOSIS — E785 Hyperlipidemia, unspecified: Secondary | ICD-10-CM | POA: Diagnosis not present

## 2023-08-08 DIAGNOSIS — E1159 Type 2 diabetes mellitus with other circulatory complications: Secondary | ICD-10-CM | POA: Diagnosis not present

## 2023-08-17 DIAGNOSIS — E785 Hyperlipidemia, unspecified: Secondary | ICD-10-CM | POA: Diagnosis not present

## 2023-08-17 DIAGNOSIS — E1159 Type 2 diabetes mellitus with other circulatory complications: Secondary | ICD-10-CM | POA: Diagnosis not present

## 2023-09-08 ENCOUNTER — Telehealth: Payer: Self-pay | Admitting: Cardiology

## 2023-09-08 NOTE — Telephone Encounter (Signed)
Patient has move to  and would like to start seeing Dr. Dulce Sellar. Please advise

## 2023-09-17 DIAGNOSIS — E1159 Type 2 diabetes mellitus with other circulatory complications: Secondary | ICD-10-CM | POA: Diagnosis not present

## 2023-09-17 DIAGNOSIS — E785 Hyperlipidemia, unspecified: Secondary | ICD-10-CM | POA: Diagnosis not present

## 2023-09-24 ENCOUNTER — Other Ambulatory Visit: Payer: Self-pay | Admitting: Cardiology

## 2023-09-26 NOTE — Telephone Encounter (Signed)
 Prescription refill request for Eliquis  received. Indication:afib Last office visit:7/24 Scr:1.65  3/24 Age: 83 Weight:107  kg  Prescription refilled

## 2023-10-03 DIAGNOSIS — R0981 Nasal congestion: Secondary | ICD-10-CM | POA: Diagnosis not present

## 2023-10-03 DIAGNOSIS — Z20822 Contact with and (suspected) exposure to covid-19: Secondary | ICD-10-CM | POA: Diagnosis not present

## 2023-10-03 DIAGNOSIS — I509 Heart failure, unspecified: Secondary | ICD-10-CM | POA: Diagnosis not present

## 2023-10-03 DIAGNOSIS — J40 Bronchitis, not specified as acute or chronic: Secondary | ICD-10-CM | POA: Diagnosis not present

## 2023-10-03 DIAGNOSIS — Z6832 Body mass index (BMI) 32.0-32.9, adult: Secondary | ICD-10-CM | POA: Diagnosis not present

## 2023-11-15 DIAGNOSIS — E1159 Type 2 diabetes mellitus with other circulatory complications: Secondary | ICD-10-CM | POA: Diagnosis not present

## 2023-11-15 DIAGNOSIS — I482 Chronic atrial fibrillation, unspecified: Secondary | ICD-10-CM | POA: Diagnosis not present

## 2023-11-17 ENCOUNTER — Ambulatory Visit: Payer: PPO | Admitting: Cardiology

## 2023-12-01 DIAGNOSIS — L578 Other skin changes due to chronic exposure to nonionizing radiation: Secondary | ICD-10-CM | POA: Diagnosis not present

## 2023-12-01 DIAGNOSIS — L570 Actinic keratosis: Secondary | ICD-10-CM | POA: Diagnosis not present

## 2023-12-01 DIAGNOSIS — L821 Other seborrheic keratosis: Secondary | ICD-10-CM | POA: Diagnosis not present

## 2023-12-01 DIAGNOSIS — C44729 Squamous cell carcinoma of skin of left lower limb, including hip: Secondary | ICD-10-CM | POA: Diagnosis not present

## 2023-12-08 DIAGNOSIS — I10 Essential (primary) hypertension: Secondary | ICD-10-CM | POA: Diagnosis not present

## 2023-12-08 DIAGNOSIS — E785 Hyperlipidemia, unspecified: Secondary | ICD-10-CM | POA: Diagnosis not present

## 2023-12-08 DIAGNOSIS — L02416 Cutaneous abscess of left lower limb: Secondary | ICD-10-CM | POA: Diagnosis not present

## 2023-12-08 DIAGNOSIS — E11649 Type 2 diabetes mellitus with hypoglycemia without coma: Secondary | ICD-10-CM | POA: Diagnosis not present

## 2023-12-14 DIAGNOSIS — C44729 Squamous cell carcinoma of skin of left lower limb, including hip: Secondary | ICD-10-CM | POA: Diagnosis not present

## 2023-12-15 DIAGNOSIS — Z1331 Encounter for screening for depression: Secondary | ICD-10-CM | POA: Diagnosis not present

## 2023-12-15 DIAGNOSIS — Z6833 Body mass index (BMI) 33.0-33.9, adult: Secondary | ICD-10-CM | POA: Diagnosis not present

## 2023-12-15 DIAGNOSIS — D6869 Other thrombophilia: Secondary | ICD-10-CM | POA: Diagnosis not present

## 2023-12-15 DIAGNOSIS — Z139 Encounter for screening, unspecified: Secondary | ICD-10-CM | POA: Diagnosis not present

## 2023-12-15 DIAGNOSIS — E1159 Type 2 diabetes mellitus with other circulatory complications: Secondary | ICD-10-CM | POA: Diagnosis not present

## 2023-12-15 DIAGNOSIS — Z1339 Encounter for screening examination for other mental health and behavioral disorders: Secondary | ICD-10-CM | POA: Diagnosis not present

## 2023-12-15 DIAGNOSIS — E669 Obesity, unspecified: Secondary | ICD-10-CM | POA: Diagnosis not present

## 2023-12-15 DIAGNOSIS — N1832 Chronic kidney disease, stage 3b: Secondary | ICD-10-CM | POA: Diagnosis not present

## 2023-12-15 DIAGNOSIS — Z136 Encounter for screening for cardiovascular disorders: Secondary | ICD-10-CM | POA: Diagnosis not present

## 2023-12-15 DIAGNOSIS — Z0000 Encounter for general adult medical examination without abnormal findings: Secondary | ICD-10-CM | POA: Diagnosis not present

## 2023-12-15 DIAGNOSIS — I482 Chronic atrial fibrillation, unspecified: Secondary | ICD-10-CM | POA: Diagnosis not present

## 2023-12-24 ENCOUNTER — Other Ambulatory Visit: Payer: Self-pay | Admitting: Cardiology

## 2024-01-15 DIAGNOSIS — I482 Chronic atrial fibrillation, unspecified: Secondary | ICD-10-CM | POA: Diagnosis not present

## 2024-01-15 DIAGNOSIS — E1159 Type 2 diabetes mellitus with other circulatory complications: Secondary | ICD-10-CM | POA: Diagnosis not present

## 2024-02-01 ENCOUNTER — Other Ambulatory Visit: Payer: Self-pay

## 2024-02-01 DIAGNOSIS — E785 Hyperlipidemia, unspecified: Secondary | ICD-10-CM | POA: Insufficient documentation

## 2024-02-01 DIAGNOSIS — M1990 Unspecified osteoarthritis, unspecified site: Secondary | ICD-10-CM | POA: Insufficient documentation

## 2024-02-01 DIAGNOSIS — E786 Lipoprotein deficiency: Secondary | ICD-10-CM | POA: Insufficient documentation

## 2024-02-01 DIAGNOSIS — C4490 Unspecified malignant neoplasm of skin, unspecified: Secondary | ICD-10-CM | POA: Insufficient documentation

## 2024-02-01 DIAGNOSIS — R350 Frequency of micturition: Secondary | ICD-10-CM | POA: Insufficient documentation

## 2024-02-03 ENCOUNTER — Ambulatory Visit: Attending: Cardiology

## 2024-02-03 VITALS — BP 114/70 | HR 74 | Ht 74.0 in | Wt 248.4 lb

## 2024-02-03 DIAGNOSIS — I5032 Chronic diastolic (congestive) heart failure: Secondary | ICD-10-CM | POA: Insufficient documentation

## 2024-02-03 DIAGNOSIS — I2510 Atherosclerotic heart disease of native coronary artery without angina pectoris: Secondary | ICD-10-CM | POA: Diagnosis not present

## 2024-02-03 DIAGNOSIS — I4819 Other persistent atrial fibrillation: Secondary | ICD-10-CM

## 2024-02-03 DIAGNOSIS — E782 Mixed hyperlipidemia: Secondary | ICD-10-CM

## 2024-02-03 DIAGNOSIS — I10 Essential (primary) hypertension: Secondary | ICD-10-CM

## 2024-02-03 HISTORY — DX: Chronic diastolic (congestive) heart failure: I50.32

## 2024-02-03 NOTE — Assessment & Plan Note (Signed)
 History of a flutter ablation January 2020 and atrial fibrillation ablation August 2020 at [facility]. Remains on anticoagulation with Eliquis  2.5 mg twice daily given his CKD history and age. Tolerating well.  Continue the same.  Not on any antiarrhythmics at this time. Remains in sinus rhythm today at the office.

## 2024-02-03 NOTE — Assessment & Plan Note (Signed)
 Well-controlled on current regimen with diltiazem, metoprolol , losartan . Continue the same. Target blood pressure below 130/80 mmHg.

## 2024-02-03 NOTE — Assessment & Plan Note (Signed)
 Last lipid panel from 12-08-2023 total cholesterol 66, HDL 24, LDL 71, triglycerides 142. Excellent control. Continue with his current lipid-lowering therapy with Lipitor 80 mg once daily and Zetia  10 mg once daily.

## 2024-02-03 NOTE — Assessment & Plan Note (Addendum)
 compensated, euvolemic. Last echocardiogram April 2024 with LVEF 60 to 65%, grade 1 diastolic dysfunction. Functional status limited due to back pain appears NYHA class II.  Continue with salt restriction to below 2 g/day. Continue with furosemide  80 mg in the morning and 40 mg in the afternoon. Continue to monitor daily weights at home. Weight today at the office visit to 48 pounds.  Continue guideline directed medical therapy remains on metoprolol  tartrate 75 mg twice daily Diltiazem 120 mg once daily Farxiga 10 mg once daily Losartan  50 mg once daily.  [Given his renal function we will hold off on Entresto].  Continue to follow-up closely with nephrologist with regards to his underlying history of CKD stage III.

## 2024-02-03 NOTE — Patient Instructions (Signed)

## 2024-02-03 NOTE — Progress Notes (Signed)
 Cardiology Consultation:    Date:  02/03/2024   ID:  Jack Farmer, DOB [birth year], MRN [id number]  PCP:  Barbar Levine, MD  Cardiologist:  Daymon Evans Mame Twombly, MD   Referring MD: Barbar Levine, MD   No chief complaint on file.    ASSESSMENT AND PLAN:   [name] 83 year old male has history of CAD last cardiac cath September 2019 moderate disease in LCx and RCA medical therapy recommended, with preserved biventricular function on echocardiogram, atrial flutter s/p ablation at [facility] in January 2020 and s/p atrial fibrillation ablation August 2020, CHF with last echocardiogram April 2024  EF 60 to 65% grade 1 diastolic dysfunction, CKD stage III, hyperlipidemia, hypertension, chronic back pain that limits his mobility, obesity [lost 50 pounds in the past 2 and half years being on Ozempic ].   Last atrial flutter and chronic diastolic CHF related admission to the hospital at [facility] was in March 2024 converted after IV amiodarone but did not require any antiarrhythmics at discharge and has not had any further recurrence.  Is here for a routine follow-up visit and to establish care with me after relocating to [facility].   Problem List Items Addressed This Visit     Persistent atrial fibrillation [facility]   History of a flutter ablation January 2020 and atrial fibrillation ablation August 2020 at [facility]. Remains on anticoagulation with Eliquis  2.5 mg twice daily given his CKD history and age. Tolerating well.  Continue the same.  Not on any antiarrhythmics at this time. Remains in sinus rhythm today at the office.       CAD (coronary artery disease) - Primary   Moderate nonobstructive disease based on cardiac cath from September 2019. No significant symptoms of angina. Functional status is somewhat limited due to chronic back pain.  Not on aspirin  as he continues on low-dose Eliquis  for his A-fib. Remains on Lipitor and Zetia  for lipid  management.  Continue diltiazem 120 mg once daily  Continue metoprolol  tartrate 75 mg twice daily.       Relevant Orders   EKG 12-Lead (Completed)   Hyperlipidemia   Last lipid panel from 12-08-2023 total cholesterol 66, HDL 24, LDL 71, triglycerides 142. Excellent control. Continue with his current lipid-lowering therapy with Lipitor 80 mg once daily and Zetia  10 mg once daily.       Hypertension   Well-controlled on current regimen with diltiazem, metoprolol , losartan . Continue the same. Target blood pressure below 130/80 mmHg.       Chronic diastolic heart failure (HCC)   compensated, euvolemic. Last echocardiogram April 2024 with LVEF 60 to 65%, grade 1 diastolic dysfunction. Functional status limited due to back pain appears NYHA class II.  Continue with salt restriction to below 2 g/day. Continue with furosemide  80 mg in the morning and 40 mg in the afternoon. Continue to monitor daily weights at home. Weight today at the office visit to 48 pounds.  Continue guideline directed medical therapy remains on metoprolol  tartrate 75 mg twice daily Diltiazem 120 mg once daily Farxiga 10 mg once daily Losartan  50 mg once daily.  [Given his renal function we will hold off on Entresto].  Continue to follow-up closely with nephrologist with regards to his underlying history of CKD stage III.        Return to clinic tentatively in 6 months.  History of Present Illness:    Jack Farmer is a 83 y.o. male who is being seen today for follow-up visit.  PCP is Barbar Levine, MD. Last visit with cardiologist was 02/23/2023 with Dr. Audery Blazing.  Pleasant man here for the visit today accompanied by his wife and they both had visits with me this morning.  Been married for 2-1/2 years and he relocated to [facility] and now here to establish care with me   Has history of CAD last cardiac cath September 2019 moderate disease in LCx and RCA medical therapy recommended, with  preserved biventricular function on echocardiogram, atrial flutter s/p ablation at [facility] in January 2020 and s/p atrial fibrillation ablation August 2020, CHF with last echocardiogram April 2024  EF 60 to 65% grade 1 diastolic dysfunction, CKD stage III, hyperlipidemia, hypertension, chronic back pain that limits his mobility.   Last atrial flutter related admission to the hospital at [facility] was in March 2024 converted after IV amiodarone but did not require any antiarrhythmics at discharge and has not had any further recurrence. Obesity and has lost 50 pounds in the past 2 and half years.  [place] overall has been doing well. They keep busy with household day-to-day activities, walks limited amount due to chronic back pain.  Balance is good.  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  Good compliance with all his medications. Continues to follow-up closely with his PCP with regards to kidney function monitoring.  EKG in the clinic today shows sinus rhythm heart rate 74/min, PR interval normal 180 ms, QRS duration 92 ms, T wave inversions in lateral leads.  Blood work from [facility] reviewed 12-08-2023 total cholesterol 66, HDL 24, LDL 70, triglycerides 142   Past Medical History:  Diagnosis Date   Abnormal electrocardiogram (ECG) (EKG) 04/11/2017   SEPT 2018      Nuclear stress EF: 51%. Consider echo to further define wall motion.    Inferior, inferolateral defect consistent with probable soft tissue attenuation. No evidence of ischemia    This is a low risk study.     Arthritis    Atherosclerosis of aorta (HCC) 06/25/2013   BPH (benign prostatic hyperplasia) 10/17/2014   CAD (coronary artery disease) 05/03/2018   moderate CAD, normal LVF- medical rx   CAD (coronary artery disease), native coronary artery 12/26/2008   Cath 2008- medical Rx.  Cath for chest pain 05/03/18-  30-40% Dx1, 60-70% OM2, 50-60% RCA  Normal LVF- normal LVEDP- medical Rx     Chronic  anticoagulation 03/14/2020   Warfarin per PCP (cost) as of July 2021- CHADS VASC=4     Cough 07/20/2018   CRI (chronic renal insufficiency), stage 3 (moderate) (HCC) 06/06/2018   SCr 1.79 July 2020- PCP following     Eczema 04/11/2017   Fatigue 06/06/2018   Pt's main complaint is exertional fatigue and dyspnea.      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        PAF (paroxysmal atrial fibrillation) (HCC)  03/14/2020   Atrial flutter Jan 2020- admitted to [facility].  Rx'd with DCCV and RFA.     Pre-operative clearance 07/30/2020   Primary insomnia 11/22/2016   Routine general medical examination at a health care facility 08/29/2011   Skin cancer    removed from face   Tobacco abuse 10/26/2017   Trochanteric bursitis of both hips 10/26/2017   Type 2 diabetes mellitus with complication, without long-term current use of insulin (HCC) 10/26/2017   Estimated Creatinine Clearance: 54.6 mL/min (A) (by C-G formula based on SCr of 1.56 mg/dL (H)).     Viral URI with cough 07/20/2018    Past Surgical History:  Procedure Laterality Date   ANTERIOR LAT LUMBAR FUSION  04/07/2012   Procedure: ANTERIOR LATERAL LUMBAR FUSION 2 LEVELS;  Surgeon: Isadora Mar, MD;  Location: [facility] NEUR[facility];  Service: Neurosurgery;  Laterality: Right;  Lumbar three-four and four-five extreme lumbar interbody fusion   BACK SURGERY     CARDIAC CATHETERIZATION     COLONOSCOPY W/ POLYPECTOMY     EYE SURGERY     tightened muscles in right eye   LEFT HEART  CATH AND CORONARY ANGIOGRAPHY N/A 05/03/2018   Procedure: LEFT HEART CATH AND CORONARY ANGIOGRAPHY;  Surgeon: Arty Binning, MD;  Location: [facility] INVAS[facility] CV LAB;  Service: Cardiovascular;  Laterality: N/A;   TONSILLECTOMY      Current Medications: Current Meds  Medication Sig   atorvastatin  (LIPITOR) 80 MG tablet Take 80 mg by mouth daily.   dapagliflozin propanediol (FARXIGA) 10 MG TABS tablet Take 1 tablet by mouth daily.   diltiazem (CARDIZEM LA) 120 MG 24 hr tablet Take 120 mg by mouth daily.   docusate sodium  (COLACE) 100 MG capsule Take 100 mg by mouth 2 (two) times daily.   ELIQUIS  2.5 MG TABS tablet Take 1 tablet (2.5 mg total) by mouth 2 (two) times daily.   ezetimibe  (ZETIA ) 10 MG tablet Take 1 tablet (10 mg total) by mouth daily.   Flaxseed, Linseed, (FLAXSEED OIL PO) Take 1 Scoop by mouth daily.   furosemide  (LASIX ) 40 MG tablet Take 2 tablets (80 mg total) by mouth 2 (two) times daily.   Lactobacillus (PROBIOTIC ACIDOPHILUS PO) Take 1 capsule by mouth daily.   losartan  (COZAAR ) 50 MG tablet Take 50 mg by mouth daily.   Metoprolol  Tartrate 75 MG TABS Take 75 mg by mouth in the morning and at bedtime.   Multiple Vitamins-Minerals (ONE-A-DAY MENS 50+ PO) Take 1 tablet by mouth daily.   multivitamin-lutein (OCUVITE-LUTEIN) CAPS capsule Take 1 capsule by mouth daily.   nitroGLYCERIN  (NITROSTAT ) 0.4 MG SL tablet Place 1 tablet (0.4 mg total) under the tongue every 5 (five) minutes x 3 doses as needed for chest pain.   omega-3 acid ethyl esters (LOVAZA ) 1 g capsule Take 1 capsule (1 g total) by mouth 2 (two) times daily.   Omega-3 Fatty Acids (OMEGA 3 PO) Take 1 capsule by mouth daily.   OVER THE COUNTER MEDICATION Balance of nature whole produce veggies 3 capsules in the AM   OVER THE COUNTER MEDICATION Balance of nature whole produce fruits 3 capsules in the AM   polyethylene glycol (MIRALAX  / GLYCOLAX ) 17 g packet Take 17 g by mouth daily. 1/2 cap full in the AM   Semaglutide ,0.25  or 0.5MG /DOS, (OZEMPIC , 0.25 OR 0.5 MG/DOSE,) 2 MG/1.5ML SOPN Inject 0.5 mg into the skin once a week.   [DISCONTINUED] diltiazem (TIAZAC) 120 MG 24 hr capsule Take 120 mg by mouth  daily.     Allergies:   Cephalexin and Indocin [indomethacin]   Social History   Socioeconomic History   Marital status: Married    Spouse name: Not on file   Number of children: 4   Years of education: Not on file   Highest education level: Not on file  Occupational History   Not on file  Tobacco Use   Smoking status: Some Days    Current packs/day: 0.00    Types: Cigarettes    Last attempt to quit: 08/16/2006    Years since quitting: 17.4   Smokeless tobacco: Never  Substance and Sexual Activity   Alcohol  use: Yes    Comment: Rare   Drug use: No   Sexual activity: Yes  Other Topics Concern   Not on file  Social History Narrative   Not on file   Social Drivers of Health   Financial Resource Strain: Not on file  Food Insecurity: Not on file  Transportation Needs: Not on file  Physical Activity: Not on file  Stress: Not on file  Social Connections: Not on file     Family History: The patient's family history includes Alcohol  abuse in an other family member; Arthritis in an other family member; Drug abuse in an other family member; Hypertension in an other family member. There is no history of Diabetes. ROS:   Please see the history of present illness.    All 14 point review of systems negative except as described per history of present illness.  EKGs/Labs/Other Studies Reviewed:    The following studies were reviewed today:   EKG:  EKG Interpretation Date/Time:  Friday February 03 2024 11:26:33 EDT Ventricular Rate:  74 PR Interval:  180 QRS Duration:  92 QT Interval:  396 QTC Calculation: 439 R Axis:   -28  Text Interpretation: Normal sinus rhythm Abnormal QRS-T angle, consider primary T wave abnormality Abnormal ECG When compared with ECG of 02-May-2018 15:29, QRS axis Shifted left  Inverted T waves have replaced nonspecific T wave abnormality in Lateral leads Confirmed by Bertha Broad reddy [phone]) on 02/03/2024 11:44:01 AM    Recent Labs: No results found for requested labs within last 365 days.  Recent Lipid Panel    Component Value Date/Time   CHOL 104 02/02/2021 0814   TRIG 313 (H) 02/02/2021 0814   HDL 25 (L) 02/02/2021 0814   CHOLHDL 4.2 02/02/2021 0814   CHOLHDL 3.7 05/03/2018 0235   VLDL 35 05/03/2018 0235   LDLCALC 32 02/02/2021 0814   LDLDIRECT 46.0 10/26/2017 1532    Physical Exam:    VS:  BP 114/70   Pulse 74   Ht 6' 2 (1.88 m)   Wt 248 lb 6.4 oz (112.7 kg)   SpO2 93%   BMI 31.89 kg/m     Wt Readings from Last 3 Encounters:  02/03/24 248 lb 6.4 oz (112.7 kg)  02/23/23 236 lb (107 kg)  11/03/22 235 lb (106.6 kg)     GENERAL:  Well nourished, well developed in no acute distress NECK: No JVD; No carotid bruits CARDIAC: RRR, S1 and S2 present, no murmurs, no rubs, no gallops CHEST:  Clear to auscultation without rales, wheezing or rhonchi  Extremities: No pitting pedal edema. Pulses bilaterally symmetric with radial 2+ and dorsalis pedis 2+ NEUROLOGIC:  Alert and oriented x 3  Medication Adjustments/Labs and Tests Ordered: Current medicines are reviewed at length with the patient today.  Concerns regarding medicines are outlined above.  Orders Placed This Encounter  Procedures   EKG 12-Lead   No orders of the defined types were placed in this encounter.   Signed, Lura Sallies, MD, MPH, [facility]. 02/03/2024 12:33 PM     [facility]

## 2024-02-03 NOTE — Assessment & Plan Note (Addendum)
 Moderate nonobstructive disease based on cardiac cath from September 2019. No significant symptoms of angina. Functional status is somewhat limited due to chronic back pain.  Not on aspirin  as he continues on low-dose Eliquis  for his A-fib. Remains on Lipitor and Zetia  for lipid management.  Continue diltiazem 120 mg once daily  Continue metoprolol  tartrate 75 mg twice daily.

## 2024-02-14 DIAGNOSIS — I482 Chronic atrial fibrillation, unspecified: Secondary | ICD-10-CM | POA: Diagnosis not present

## 2024-02-14 DIAGNOSIS — E1159 Type 2 diabetes mellitus with other circulatory complications: Secondary | ICD-10-CM | POA: Diagnosis not present

## 2024-03-15 DIAGNOSIS — I4891 Unspecified atrial fibrillation: Secondary | ICD-10-CM | POA: Diagnosis not present

## 2024-03-15 DIAGNOSIS — Z7901 Long term (current) use of anticoagulants: Secondary | ICD-10-CM | POA: Diagnosis not present

## 2024-03-15 DIAGNOSIS — Z87891 Personal history of nicotine dependence: Secondary | ICD-10-CM | POA: Diagnosis not present

## 2024-03-15 DIAGNOSIS — I499 Cardiac arrhythmia, unspecified: Secondary | ICD-10-CM | POA: Diagnosis not present

## 2024-03-15 DIAGNOSIS — N189 Chronic kidney disease, unspecified: Secondary | ICD-10-CM | POA: Diagnosis not present

## 2024-03-15 DIAGNOSIS — I4892 Unspecified atrial flutter: Secondary | ICD-10-CM | POA: Diagnosis not present

## 2024-03-15 LAB — LAB REPORT - SCANNED: EGFR: 32

## 2024-03-16 DIAGNOSIS — I482 Chronic atrial fibrillation, unspecified: Secondary | ICD-10-CM | POA: Diagnosis not present

## 2024-03-16 DIAGNOSIS — E1159 Type 2 diabetes mellitus with other circulatory complications: Secondary | ICD-10-CM | POA: Diagnosis not present

## 2024-03-19 DIAGNOSIS — Z9989 Dependence on other enabling machines and devices: Secondary | ICD-10-CM | POA: Diagnosis not present

## 2024-03-19 DIAGNOSIS — I483 Typical atrial flutter: Secondary | ICD-10-CM | POA: Diagnosis not present

## 2024-03-19 DIAGNOSIS — R002 Palpitations: Secondary | ICD-10-CM | POA: Diagnosis not present

## 2024-03-22 DIAGNOSIS — Z1331 Encounter for screening for depression: Secondary | ICD-10-CM | POA: Diagnosis not present

## 2024-03-22 DIAGNOSIS — I482 Chronic atrial fibrillation, unspecified: Secondary | ICD-10-CM | POA: Diagnosis not present

## 2024-03-22 DIAGNOSIS — E1122 Type 2 diabetes mellitus with diabetic chronic kidney disease: Secondary | ICD-10-CM | POA: Diagnosis not present

## 2024-03-22 DIAGNOSIS — Z6833 Body mass index (BMI) 33.0-33.9, adult: Secondary | ICD-10-CM | POA: Diagnosis not present

## 2024-03-22 DIAGNOSIS — N183 Chronic kidney disease, stage 3 unspecified: Secondary | ICD-10-CM | POA: Diagnosis not present

## 2024-03-22 DIAGNOSIS — Z7689 Persons encountering health services in other specified circumstances: Secondary | ICD-10-CM | POA: Diagnosis not present

## 2024-04-02 ENCOUNTER — Other Ambulatory Visit: Payer: Self-pay

## 2024-04-02 NOTE — Progress Notes (Unsigned)
 Cardiology Office Note   Date:  04/03/2024  ID:  Jack Farmer, Jack Farmer 1941/03/10, MRN [id number] PCP: Trinidad Glisson, MD   [facility] Prov[facility] Cardiologist:  Alean SAUNDERS Madireddy, MD Cardiology APP:  Carlin Delon BROCKS, NP     History of Present Illness Jack Farmer is a 83 y.o. male with a past medical history of moderate but non-obstructive CAD, HFpEF, hypertension, atrial fibrillation, DM 2, BPH, overactive bladder, dyslipidemia, history of tobacco abuse.  03/19/2024 DCCV x 1 >> successful outlying hospital 03/15/2024 DCCX x 1 >> successful outlying hospital 11/18/2022 echo EF 60 to 65%, grade 1 DD 03/11/2021 echo EF 60 to 65%, mild concentric LVH, grade 1 DD 03/21/2019 atrial fibrillation ablation 09/12/2018 A-flutter ablation 08/25/2018 myocardial amyloid imaging was a nondiagnostic study but equivocal 05/03/2018 cardiac cath moderate nonobstructive three-vessel CAD, no intervention 2008 left heart cath nonobstructive but moderate CAD  [facility] with [facility] in 2014 for the management of hypertension and CAD.  Followed by [facility] cardiology for his EP needs.  In 2019 he was bothered by shortness of breath and a PYP scan was arranged to evaluate for amyloidosis, which resulted as equivocal for ATTR amyloidosis however a cardiac MRI was not pursued secondary to renal dysfunction, not clear if any further evaluation was completed for this.  In January 2020 he underwent atrial flutter ablation at [facility]  >> later that year in August 2020 he had a repeat atrial fibrillation ablation.  He transferred his care to Dr. Liborio in June 2025, he was maintaining sinus rhythm no changes were made to his medications or plan of care and he was advised he could follow-up in 6 months.  Most recently evaluated in the emergency department on 03/19/2024, was noted to be in atrial flutter with RVR, had apparently been recently cardioverted the Thursday prior to this, he did undergo a repeat  cardioversion in the emergency department x 1 with conversion to normal sinus rhythm.  He presents today accompanied by his wife for follow up of his atrial fibrillation. He monitors his EKG on his smart watch, is keenly aware when he is out of rhythm. Unsure if he has been on antiarrhythmics in the past or not, but I do see notation of amiodarone in the past. He is bothered by shortness of breath that is present with minimal exertion. We discussed prior workup for his shortness of breath, he had a PYP scan in 2020 which was equivocal but MRI was not pursued secondary to kidney dysfunction. He denies chest pain, palpitations, dyspnea, pnd, orthopnea, n, v, dizziness, syncope, edema, weight gain, or early satiety.   ROS: Review of Systems  Constitutional:  Positive for malaise/fatigue.  Respiratory:  Positive for shortness of breath (with minimal exertion).        Studies Reviewed      Cardiac Studies & Procedures   ______________________________________________________________________________________________ CARDIAC CATHETERIZATION  CARDIAC CATHETERIZATION 05/03/2018  Conclusion  Moderate, nonobstructive three-vessel CAD  Normal left main  LAD does not wraparound the left ventricular apex.  The LAD gives origin to a large first diagonal.  The first diagonal contains mid segment 30 to 40% narrowing.  The LAD territory is otherwise widely patent.  Circumflex gives origin to 3 obtuse marginals, the second of which is the larger of the branches.  Beyond the origin of the second marginal there is concentric 60 to 70% stenosis unchanged from angiography in 2008.  The third obtuse marginal contains segmental 50% narrowing unchanged from prior angiography.  The right coronary is dominant.  Moderate calcification is noted throughout the proximal and mid vessel.  Eccentric 50 to 60% mid stenosis is noted.  Diffuse 50 to 60% distal stenosis.  PDA contains 50 to 60% mid stenosis.  Left ventricular  function is normal.  EF is estimated to be 60%.  LVEDP is upper normal at 18 mmHg.  RECOMMENDATIONS:   Aggressive risk factor modification.  Consider alternative explanations for chest discomfort and dyspnea.   Recommend Aspirin  81mg  daily for moderate CAD.  Findings Coronary Findings Diagnostic  Dominance: Right  Left Anterior Descending  First Diagonal Branch Ost 1st Diag to 1st Diag lesion is 35% stenosed.  Second Diagonal Branch Vessel is small in size.  Third Diagonal Branch Vessel is small in size.  Left Circumflex Mid Cx lesion is 60% stenosed.  First Obtuse Marginal Branch Vessel is small in size.  Second Obtuse Marginal Branch Vessel is large in size.  Third Obtuse Marginal Branch Ost 3rd Mrg to 3rd Mrg lesion is 50% stenosed.  Right Coronary Artery Prox RCA to Mid RCA lesion is 60% stenosed. Mid RCA to Dist RCA lesion is 55% stenosed.  Right Posterior Descending Artery Ost RPDA to RPDA lesion is 50% stenosed.  First Right Posterolateral Branch Vessel is small in size.  Second Right Posterolateral Branch Vessel is large in size.  Intervention  No interventions have been documented.   STRESS TESTS  MYOCARDIAL PERFUSION IMAGING 04/25/2017  Interpretation Summary  Nuclear stress EF: 51%. Consider echo to further define wall motion.  Inferior, inferolateral defect consistent with probable soft tissue attenuation. No evidence of ischemia  This is a low risk study.   ECHOCARDIOGRAM  ECHOCARDIOGRAM COMPLETE 11/18/2022  Narrative ECHOCARDIOGRAM REPORT    Patient Name:   Jack Farmer Date of Exam: 11/18/2022 Medical Rec #:  [id number]            Height:       75.0 in Accession #:    [id number]           Weight:       235.0 lb Date of Birth:  Nov 14, 1940            BSA:          2.350 m Patient Age:    82 years             BP:           136/68 mmHg Patient Gender: M                    HR:           74 bpm. Exam Location:  [place]  Procedure: 2D Echo, Cardiac Doppler and Color Doppler  Indications:    Persistent atrial fibrillation [I48.19 (ICD-10-CM)]  History:        Patient has prior history of Echocardiogram examinations, most recent 03/11/2021. CHF, CAD, Arrythmias:Atrial Fibrillation, Signs/Symptoms:Dyspnea; Risk Factors:Hypertension and Dyslipidemia.  Sonographer:    Charlie Jointer RDCS Referring Phys: [id number] BRIAN S CRENSHAW   Sonographer Comments: Global longitudinal strain was attempted. IMPRESSIONS   1. Left ventricular ejection fraction, by estimation, is 60 to 65%. The left ventricle has normal function. The left ventricle has no regional wall motion abnormalities. Left ventricular diastolic parameters are consistent with Grade I diastolic dysfunction (impaired relaxation). 2. Right ventricular systolic function is normal. The right ventricular size is normal. There is normal pulmonary artery systolic pressure. 3. The mitral valve is normal in  structure. No evidence of mitral valve regurgitation. No evidence of mitral stenosis. 4. The aortic valve is normal in structure. Aortic valve regurgitation is not visualized. No aortic stenosis is present. 5. The inferior vena cava is normal in size with greater than 50% respiratory variability, suggesting right atrial pressure of 3 mmHg.  FINDINGS Left Ventricle: Left ventricular ejection fraction, by estimation, is 60 to 65%. The left ventricle has normal function. The left ventricle has no regional wall motion abnormalities. The left ventricular internal cavity size was normal in size. There is no left ventricular hypertrophy. Left ventricular diastolic parameters are consistent with Grade I diastolic dysfunction (impaired relaxation).  Right Ventricle: The right ventricular size is normal. No increase in right ventricular wall thickness. Right ventricular systolic function is normal. There is normal pulmonary artery systolic pressure. The tricuspid  regurgitant velocity is 2.52 m/s, and with an assumed right atrial pressure of 3 mmHg, the estimated right ventricular systolic pressure is 28.4 mmHg.  Left Atrium: Left atrial size was normal in size.  Right Atrium: Right atrial size was normal in size.  Pericardium: There is no evidence of pericardial effusion.  Mitral Valve: The mitral valve is normal in structure. No evidence of mitral valve regurgitation. No evidence of mitral valve stenosis.  Tricuspid Valve: The tricuspid valve is normal in structure. Tricuspid valve regurgitation is not demonstrated. No evidence of tricuspid stenosis.  Aortic Valve: The aortic valve is normal in structure. Aortic valve regurgitation is not visualized. No aortic stenosis is present.  Pulmonic Valve: The pulmonic valve was normal in structure. Pulmonic valve regurgitation is not visualized. No evidence of pulmonic stenosis.  Aorta: The aortic root is normal in size and structure.  Venous: The inferior vena cava is normal in size with greater than 50% respiratory variability, suggesting right atrial pressure of 3 mmHg.  IAS/Shunts: No atrial level shunt detected by color flow Doppler.   LEFT VENTRICLE PLAX 2D LVIDd:         4.60 cm   Diastology LVIDs:         3.70 cm   LV e' medial:    8.16 cm/s LV PW:         1.10 cm   LV E/e' medial:  7.0 LV IVS:        1.60 cm   LV e' lateral:   6.96 cm/s LVOT diam:     2.20 cm   LV E/e' lateral: 8.2 LV SV:         56 LV SV Index:   24 LVOT Area:     3.80 cm   RIGHT VENTRICLE             IVC RV Basal diam:  3.90 cm     IVC diam: 2.00 cm RV Mid diam:    2.70 cm RV S prime:     17.00 cm/s TAPSE (M-mode): 1.8 cm  LEFT ATRIUM             Index        RIGHT ATRIUM           Index LA diam:        3.80 cm 1.62 cm/m   RA Area:     21.10 cm LA Vol (A2C):   31.5 ml 13.40 ml/m  RA Volume:   52.10 ml  22.17 ml/m LA Vol (A4C):   48.0 ml 20.43 ml/m LA Biplane Vol: 38.7 ml 16.47 ml/m AORTIC VALVE LVOT  Vmax:   77.50 cm/s  LVOT Vmean:  54.400 cm/s LVOT VTI:    0.147 m  AORTA Ao Root diam: 3.50 cm Ao Asc diam:  3.60 cm Ao Desc diam: 2.30 cm  MITRAL VALVE               TRICUSPID VALVE MV Area (PHT): 4.77 cm    TR Peak grad:   25.4 mmHg MV Decel Time: 159 msec    TR Vmax:        252.00 cm/s MV E velocity: 56.90 cm/s MV A velocity: 73.80 cm/s  SHUNTS MV E/A ratio:  0.77        Systemic VTI:  0.15 m Systemic Diam: 2.20 cm  Rosser Collington Crape MD Electronically signed by Janaria Mccammon Crape MD Signature Date/Time: 11/18/2022/3:56:37 PM    Final         PYP SCAN  MYOCARDIAL AMYLOID PLANAR AND SPECT 08/25/2018  Interpretation Summary  Heart to lung ratio of 1.2 (equivocal), and visual score 1 (equivocal). Findings are equivocal for TTR amyloidosis.  Equivocal results could represent AL amyloid or early TTR amyloid.  ______________________________________________________________________________________________      Risk Assessment/Calculations  CHA2DS2-VASc Score = 6   This indicates a 9.7% annual risk of stroke. The patient's score is based upon: CHF History: 1 HTN History: 1 Diabetes History: 1 Stroke History: 0 Vascular Disease History: 1 Age Score: 2 Gender Score: 0            Physical Exam VS:  BP 100/60   Pulse 73   Ht 6' 2 (1.88 m)   SpO2 95%   BMI 31.89 kg/m        Wt Readings from Last 3 Encounters:  02/03/24 248 lb 6.4 oz (112.7 kg)  02/23/23 236 lb (107 kg)  11/03/22 235 lb (106.6 kg)    GEN: Well nourished, well developed in no acute distress NECK: No JVD; No carotid bruits CARDIAC: RRR, no murmurs, rubs, gallops RESPIRATORY:  Clear to auscultation without rales, wheezing or rhonchi  ABDOMEN: Soft, non-tender, non-distended EXTREMITIES:  No edema; No deformity   ASSESSMENT AND PLAN Paroxysmal atrial fibrillation/atrial flutter/hypercoagulable state- ablations and DCCV as outlined above. Was previously followed by [facility] EP. Will refer to our EP team  for further management of his AF. He is not sure if he has been on antiarrhytmics in the past but in his history it looks like he was on amiodarone for a short period of time. He has known CAD, so would not be a flecainide candidate.  Continue Cardizem 120 mg daily, Eliquis  2.5 mg twice daily--dose reduction based on age and creatinine.  CAD-moderate but nonobstructive per most recent left heart cath. Stable with no anginal symptoms. No indication for ischemic evaluation.  Currently on Eliquis  therefore he is not on aspirin .  HFpEF-NYHA class II for DOE, continue Farxiga 10 mg daily.  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 Will check labs.         Dispo: Refer to EP, Multiple myeloma panel, immunofixation, urine , kappa lambda light chain SPEP, IFE . Follow up based on testing.   Signed, Delon JAYSON Hoover, NP

## 2024-04-03 ENCOUNTER — Encounter: Payer: Self-pay | Admitting: Cardiology

## 2024-04-03 ENCOUNTER — Telehealth: Payer: Self-pay | Admitting: Cardiology

## 2024-04-03 ENCOUNTER — Ambulatory Visit: Attending: Cardiology | Admitting: Cardiology

## 2024-04-03 VITALS — BP 100/60 | HR 73 | Ht 74.0 in

## 2024-04-03 DIAGNOSIS — I5032 Chronic diastolic (congestive) heart failure: Secondary | ICD-10-CM | POA: Diagnosis not present

## 2024-04-03 DIAGNOSIS — I2510 Atherosclerotic heart disease of native coronary artery without angina pectoris: Secondary | ICD-10-CM | POA: Diagnosis not present

## 2024-04-03 DIAGNOSIS — I480 Paroxysmal atrial fibrillation: Secondary | ICD-10-CM

## 2024-04-03 DIAGNOSIS — D6859 Other primary thrombophilia: Secondary | ICD-10-CM

## 2024-04-03 DIAGNOSIS — E8582 Wild-type transthyretin-related (ATTR) amyloidosis: Secondary | ICD-10-CM

## 2024-04-03 DIAGNOSIS — I10 Essential (primary) hypertension: Secondary | ICD-10-CM | POA: Diagnosis not present

## 2024-04-03 NOTE — Telephone Encounter (Signed)
 Please let him know I have placed the lab orders.   Have him come back in to get 24 urine test, and then when he is done with the urine test--when he brings that back we will check blood work on that day.

## 2024-04-03 NOTE — Telephone Encounter (Signed)
 Called the patient and informed him of Jennifer's message below:  Please let him know I have placed the lab orders.    Have him come back in to get 24 urine test, and then when he is done with the urine test--when he brings that back we will check blood work on that day.  Patient verbalized understanding and had no further questions at this time.

## 2024-04-03 NOTE — Patient Instructions (Addendum)
 Medication Instructions:   Continue current meds   *If you need a refill on your cardiac medications before your next appointment, please call your pharmacy*  Lab Work:  None today   If you have labs (blood work) drawn today and your tests are completely normal, you will receive your results only by: MyChart Message (if you have MyChart) OR A paper copy in the mail If you have any lab test that is abnormal or we need to change your treatment, we will call you to review the results.  Testing/Procedures:  To be determined  Follow-Up: At [facility], you and your health needs are our priority.  As part of our continuing mission to provide you with exceptional heart care, our providers are all part of one team.  This team includes your primary Cardiologist (physician) and Advanced Practice Providers or APPs (Physician Assistants and Nurse Practitioners) who all work together to provide you with the care you need, when you need it.  Your next appointment:     Provider:   We are going to have you see Dr. Inocencio, first available either in [place] or here in [place]  We recommend signing up for the patient portal called MyChart.  Sign up information is provided on this After Visit Summary.  MyChart is used to connect with patients for Virtual Visits (Telemedicine).  Patients are able to view lab/test results, encounter notes, upcoming appointments, etc.  Non-urgent messages can be sent to your provider as well.   To learn more about what you can do with MyChart, go to [email or web address].   Other Instructions  Echo 11/2022 EF 60-65%, grade I DD -- essentially normal 2020 PYP scan:  Heart to lung ratio of 1.2 (equivocal), and visual score 1 (equivocal). Findings are equivocal for TTR amyloidosis. Equivocal results could represent AL amyloid or early TTR amyloid.   I am speaking with the Heart Failure team about what further testing we need to do and we will be in touch.

## 2024-04-04 DIAGNOSIS — E119 Type 2 diabetes mellitus without complications: Secondary | ICD-10-CM | POA: Diagnosis not present

## 2024-04-06 DIAGNOSIS — I10 Essential (primary) hypertension: Secondary | ICD-10-CM | POA: Diagnosis not present

## 2024-04-06 DIAGNOSIS — E8582 Wild-type transthyretin-related (ATTR) amyloidosis: Secondary | ICD-10-CM | POA: Diagnosis not present

## 2024-04-06 DIAGNOSIS — I2510 Atherosclerotic heart disease of native coronary artery without angina pectoris: Secondary | ICD-10-CM | POA: Diagnosis not present

## 2024-04-06 DIAGNOSIS — I480 Paroxysmal atrial fibrillation: Secondary | ICD-10-CM | POA: Diagnosis not present

## 2024-04-06 DIAGNOSIS — D6859 Other primary thrombophilia: Secondary | ICD-10-CM | POA: Diagnosis not present

## 2024-04-06 DIAGNOSIS — I5032 Chronic diastolic (congestive) heart failure: Secondary | ICD-10-CM | POA: Diagnosis not present

## 2024-04-10 LAB — UPEP/UIFE/LIGHT CHAINS/TP, 24-HR UR
% BETA, Urine: 15.7 %
ALBUMIN, U: 63.5 %
ALPHA 1 URINE: 2.7 %
ALPHA-2-GLOBULIN, U: 5.9 %
Free Kappa Lt Chains,Ur: 17.1 mg/L (ref 1.17–86.46)
Free Lambda Lt Chains,Ur: 3.25 mg/L (ref 0.27–15.21)
GAMMA GLOBULIN URINE: 12.2 %
Kappa/Lambda Ratio,U: 5.26 (ref 1.83–14.26)
Protein, 24H Urine: 132 mg/(24.h) (ref 30–150)
Protein, Ur: 6 mg/dL

## 2024-04-10 LAB — PROTEIN ELECTROPHORESIS, SERUM
A/G Ratio: 1.4 (ref 0.7–1.7)
Albumin ELP: 3.8 g/dL (ref 2.9–4.4)
Alpha 1: 0.2 g/dL (ref 0.0–0.4)
Alpha 2: 0.8 g/dL (ref 0.4–1.0)
Beta: 1.1 g/dL (ref 0.7–1.3)
Gamma Globulin: 0.8 g/dL (ref 0.4–1.8)
Globulin, Total: 2.8 g/dL (ref 2.2–3.9)
Total Protein: 6.6 g/dL (ref 6.0–8.5)

## 2024-04-12 ENCOUNTER — Ambulatory Visit: Payer: Self-pay | Admitting: Cardiology

## 2024-04-16 DIAGNOSIS — I482 Chronic atrial fibrillation, unspecified: Secondary | ICD-10-CM | POA: Diagnosis not present

## 2024-04-16 DIAGNOSIS — E1122 Type 2 diabetes mellitus with diabetic chronic kidney disease: Secondary | ICD-10-CM | POA: Diagnosis not present

## 2024-05-01 ENCOUNTER — Encounter: Payer: Self-pay | Admitting: Cardiology

## 2024-05-01 ENCOUNTER — Ambulatory Visit: Attending: Cardiology | Admitting: Cardiology

## 2024-05-01 VITALS — BP 133/75 | HR 81 | Ht 74.0 in | Wt 243.0 lb

## 2024-05-01 DIAGNOSIS — Z01812 Encounter for preprocedural laboratory examination: Secondary | ICD-10-CM

## 2024-05-01 DIAGNOSIS — D6869 Other thrombophilia: Secondary | ICD-10-CM

## 2024-05-01 DIAGNOSIS — I4819 Other persistent atrial fibrillation: Secondary | ICD-10-CM | POA: Diagnosis not present

## 2024-05-01 DIAGNOSIS — I5032 Chronic diastolic (congestive) heart failure: Secondary | ICD-10-CM

## 2024-05-01 MED ORDER — AMIODARONE HCL 200 MG PO TABS: ORAL_TABLET | ORAL | 3 refills | Status: DC

## 2024-05-01 NOTE — Patient Instructions (Signed)
 Medication Instructions:  Your physician has recommended you make the following change in your medication:  START Amiodarone   - take 2 tablets (400 mg total) TWICE a day for 2 weeks, then  - take 1 tablet (200 mg total) TWICE a day for 2 weeks, then  - take 1 tablet (200 mg total) ONCE a day   *If you need a refill on your cardiac medications before your next appointment, please call your pharmacy*   Lab Work: Pre procedure labs -- we will call you to schedule:  BMP & CBC  If you have a lab test that is abnormal and we need to change your treatment, we will call you to review the results -- otherwise no news is good news.    Testing/Procedures: Your physician has requested that you have cardiac CT 3 weeks PRIOR to your ablation. Cardiac computed tomography (CT) is a painless test that uses an x-ray machine to take clear, detailed pictures of your heart. We will contact you if the result is abnormal. We will call you to schedule.  Your physician has recommended that you have an ablation. Catheter ablation is a medical procedure used to treat some cardiac arrhythmias (irregular heartbeats). During catheter ablation, a long, thin, flexible tube is put into a blood vessel in your groin (upper thigh), or neck. This tube is called an ablation catheter. It is then guided to your heart through the blood vessel. Radio frequency waves destroy small areas of heart tissue where abnormal heartbeats may cause an arrhythmia to start. Please review the information below on ablation and after care.  Your ablation is scheduled for 07/27/2024. Please arrive at [facility] at 8:00 am.  We will call/send instructions at a later date.   Follow-Up: At [facility], you and your health needs are our priority.  As part of our continuing mission to provide you with exceptional heart care, we have created designated Provider Care Teams.  These Care Teams include your primary Cardiologist (physician) and  Advanced Practice Providers (APPs -  Physician Assistants and Nurse Practitioners) who all work together to provide you with the care you need, when you need it.  Your next appointment:   1 month(s) after your ablation  The format for your next appointment:   In Person  Provider:   AFib clinic   Thank you for choosing [facility]!!   Maeola Domino, RN [phone]    Other Instructions   Cardiac Ablation Cardiac ablation is a procedure to destroy (ablate) some heart tissue that is sending bad signals. These bad signals cause problems in heart rhythm. The heart has many areas that make these signals. If there are problems in these areas, they can make the heart beat in a way that is not normal. Destroying some tissues can help make the heart rhythm normal. Tell your doctor about: Any allergies you have. All medicines you are taking. These include vitamins, herbs, eye drops, creams, and over-the-counter medicines. Any problems you or family members have had with medicines that make you fall asleep (anesthetics). Any blood disorders you have. Any surgeries you have had. Any medical conditions you have, such as kidney failure. Whether you are pregnant or may be pregnant. What are the risks? This is a safe procedure. But problems may occur, including: Infection. Bruising and bleeding. Bleeding into the chest. Stroke or blood clots. Damage to nearby areas of your body. Allergies to medicines or dyes. The need for a pacemaker if the normal system  is damaged. Failure of the procedure to treat the problem. What happens before the procedure? Medicines Ask your doctor about: Changing or stopping your normal medicines. This is important. Taking aspirin  and ibuprofen. Do not take these medicines unless your doctor tells you to take them. Taking other medicines, vitamins, herbs, and supplements. General instructions Follow instructions from your doctor about what you cannot  eat or drink. Plan to have someone take you home from the hospital or clinic. If you will be going home right after the procedure, plan to have someone with you for 24 hours. Ask your doctor what steps will be taken to prevent infection. What happens during the procedure?  An IV tube will be put into one of your veins. You will be given a medicine to help you relax. The skin on your neck or groin will be numbed. A cut (incision) will be made in your neck or groin. A needle will be put through your cut and into a large vein. A tube (catheter) will be put into the needle. The tube will be moved to your heart. Dye may be put through the tube. This helps your doctor see your heart. Small devices (electrodes) on the tube will send out signals. A type of energy will be used to destroy some heart tissue. The tube will be taken out. Pressure will be held on your cut. This helps stop bleeding. A bandage will be put over your cut. The exact procedure may vary among doctors and hospitals. What happens after the procedure? You will be watched until you leave the hospital or clinic. This includes checking your heart rate, breathing rate, oxygen, and blood pressure. Your cut will be watched for bleeding. You will need to lie still for a few hours. Do not drive for 24 hours or as long as your doctor tells you. Summary Cardiac ablation is a procedure to destroy some heart tissue. This is done to treat heart rhythm problems. Tell your doctor about any medical conditions you may have. Tell him or her about all medicines you are taking to treat them. This is a safe procedure. But problems may occur. These include infection, bruising, bleeding, and damage to nearby areas of your body. Follow what your doctor tells you about food and drink. You may also be told to change or stop some of your medicines. After the procedure, do not drive for 24 hours or as long as your doctor tells you. This information is not  intended to replace advice given to you by your health care provider. Make sure you discuss any questions you have with your health care provider. Document Revised: 10/23/2021 Document Reviewed: 07/05/2019 Elsevier Patient Education  2023 Elsevier Inc.   Cardiac Ablation, Care After  This sheet gives you information about how to care for yourself after your procedure. Your health care provider may also give you more specific instructions. If you have problems or questions, contact your health care provider. What can I expect after the procedure? After the procedure, it is common to have: Bruising around your puncture site. Tenderness around your puncture site. Skipped heartbeats. If you had an atrial fibrillation ablation, you may have atrial fibrillation during the first several months after your procedure.  Tiredness (fatigue).  Follow these instructions at home: Puncture site care  Follow instructions from your health care provider about how to take care of your puncture site. Make sure you: If present, leave stitches (sutures), skin glue, or adhesive strips in place. These skin  closures may need to stay in place for up to 2 weeks. If adhesive strip edges start to loosen and curl up, you may trim the loose edges. Do not remove adhesive strips completely unless your health care provider tells you to do that. If a large square bandage is present, this may be removed 24 hours after surgery.  Check your puncture site every day for signs of infection. Check for: Redness, swelling, or pain. Fluid or blood. If your puncture site starts to bleed, lie down on your back, apply firm pressure to the area, and contact your health care provider. Warmth. Pus or a bad smell. A pea or marble sized lump/knot at the site is normal and can take up to three months to resolve.  Driving Do not drive for at least 4 days after your procedure or however long your health care provider recommends. (Do not resume  driving if you have previously been instructed not to drive for other health reasons.) Do not drive or use heavy machinery while taking prescription pain medicine. Activity Avoid activities that take a lot of effort for at least 7 days after your procedure. Do not lift anything that is heavier than 5 lb (4.5 kg) for one week.  No sexual activity for 1 week.  Return to your normal activities as told by your health care provider. Ask your health care provider what activities are safe for you. General instructions Take over-the-counter and prescription medicines only as told by your health care provider. Do not use any products that contain nicotine or tobacco, such as cigarettes and e-cigarettes. If you need help quitting, ask your health care provider. You may shower after 24 hours, but Do not take baths, swim, or use a hot tub for 1 week.  Do not drink alcohol  for 24 hours after your procedure. Keep all follow-up visits as told by your health care provider. This is important. Contact a health care provider if: You have redness, mild swelling, or pain around your puncture site. You have fluid or blood coming from your puncture site that stops after applying firm pressure to the area. Your puncture site feels warm to the touch. You have pus or a bad smell coming from your puncture site. You have a fever. You have chest pain or discomfort that spreads to your neck, jaw, or arm. You have chest pain that is worse with lying on your back or taking a deep breath. You are sweating a lot. You feel nauseous. You have a fast or irregular heartbeat. You have shortness of breath. You are dizzy or light-headed and feel the need to lie down. You have pain or numbness in the arm or leg closest to your puncture site. Get help right away if: Your puncture site suddenly swells. Your puncture site is bleeding and the bleeding does not stop after applying firm pressure to the area. These symptoms may  represent a serious problem that is an emergency. Do not wait to see if the symptoms will go away. Get medical help right away. Call your local emergency services (911 in the [place] Do not drive yourself to the hospital. Summary After the procedure, it is normal to have bruising and tenderness at the puncture site in your groin, neck, or forearm. Check your puncture site every day for signs of infection. Get help right away if your puncture site is bleeding and the bleeding does not stop after applying firm pressure to the area. This is a medical emergency. This information  is not intended to replace advice given to you by your health care provider. Make sure you discuss any questions you have with your health care provider.

## 2024-05-01 NOTE — Progress Notes (Signed)
 Electrophysiology Office Note:   Date:  05/01/2024  ID:  Devell Parkerson, DOB 29-May-1941, MRN [id number]  Primary Cardiologist: Alean SAUNDERS Madireddy, MD Primary Heart Failure: None Electrophysiologist: Archer Vise Gladis Norton, MD      History of Present Illness:   Jack Farmer is a 83 y.o. male with h/o nonobstructive coronary artery disease, diastolic heart failure, hypertension, atrial fibrillation, diabetes seen today for  for Electrophysiology evaluation of atrial fibrillation at the request of Delon Hoover.    January 2020 he went underwent atrial flutter ablation at [facility].  Later that year, he had a repeat atrial fibrillation ablation, August 2020.  He was evaluated in the emergency room 03/19/2024 and was noted to be in rapid atrial flutter.  He underwent cardioversion, but presented to cardiology clinic back in atrial fibrillation.  He has shortness of breath and fatigue.  He has had a few episodes of atrial fibrillation without obvious trigger.  He would prefer rhythm control at this point.  Review of systems complete and found to be negative unless listed in HPI.   EP Information / Studies Reviewed:    EKG is ordered today. Personal review as below.  EKG Interpretation Date/Time:  Tuesday May 01 2024 15:42:20 EDT Ventricular Rate:  81 PR Interval:  158 QRS Duration:  88 QT Interval:  384 QTC Calculation: 446 R Axis:   -22  Text Interpretation: Normal sinus rhythm Nonspecific ST and T wave abnormality When compared with ECG of 03-Feb-2024 11:26, No significant change was found Confirmed by Shauntay Brunelli ([id number]) on 05/01/2024 3:54:38 PM   Risk Assessment/Calculations:    CHA2DS2-VASc Score = 6   This indicates a 9.7% annual risk of stroke. The patient's score is based upon: CHF History: 1 HTN History: 1 Diabetes History: 1 Stroke History: 0 Vascular Disease History: 1 Age Score: 2 Gender Score: 0            Physical Exam:   VS:  BP 133/75 (BP  Location: Right Arm, Patient Position: Sitting, Cuff Size: Large)   Pulse 81   Ht 6' 2 (1.88 m)   Wt 243 lb (110.2 kg)   SpO2 93%   BMI 31.20 kg/m    Wt Readings from Last 3 Encounters:  05/01/24 243 lb (110.2 kg)  02/03/24 248 lb 6.4 oz (112.7 kg)  02/23/23 236 lb (107 kg)     GEN: Well nourished, well developed in no acute distress NECK: No JVD; No carotid bruits CARDIAC: Regular rate and rhythm, no murmurs, rubs, gallops RESPIRATORY:  Clear to auscultation without rales, wheezing or rhonchi  ABDOMEN: Soft, non-tender, non-distended EXTREMITIES:  No edema; No deformity   ASSESSMENT AND PLAN:    1.  Paroxysmal atrial fibrillation/flutter: Post ablation in 2020.  He has continued to have atrial fibrillation and atrial flutter.  He would prefer to avoid long-term antiarrhythmics.  Due to that, we Carynn Felling plan for ablation.  Yannely Kintzel start amiodarone  prior to ablation.  Risk, benefits, and alternatives to EP study and radiofrequency/pulse field ablation for afib were also discussed in detail today. These risks include but are not limited to stroke, bleeding, vascular damage, tamponade, perforation, damage to the esophagus, lungs, and other structures, pulmonary vein stenosis, worsening renal function, and death. The patient understands these risk and wishes to proceed.  We Shawn Carattini therefore proceed with catheter ablation at the next available time.  Carto, ICE, anesthesia are requested for the procedure.  This patient Albaraa Swingle require CT prior to ablation. To be scheduled.  2.  Coronary artery disease: Moderate on recent catheterization.  No current chest pain.  Plan per primary cardiology.  3.  Chronic diastolic heart failure: No dyspnea on exertion unrelated to atrial fibrillation.  No obvious volume overload.  4.  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

## 2024-05-01 NOTE — Addendum Note (Signed)
 Addended by: GRETEL MAEOLA CROME on: 05/01/2024 04:29 PM   Modules accepted: Orders

## 2024-05-08 DIAGNOSIS — H35372 Puckering of macula, left eye: Secondary | ICD-10-CM | POA: Diagnosis not present

## 2024-05-08 DIAGNOSIS — H26491 Other secondary cataract, right eye: Secondary | ICD-10-CM | POA: Diagnosis not present

## 2024-05-08 DIAGNOSIS — H18413 Arcus senilis, bilateral: Secondary | ICD-10-CM | POA: Diagnosis not present

## 2024-05-08 DIAGNOSIS — H532 Diplopia: Secondary | ICD-10-CM | POA: Diagnosis not present

## 2024-05-08 DIAGNOSIS — Z961 Presence of intraocular lens: Secondary | ICD-10-CM | POA: Diagnosis not present

## 2024-05-09 ENCOUNTER — Ambulatory Visit

## 2024-05-09 VITALS — BP 128/74 | HR 67 | Ht 74.0 in | Wt 244.6 lb

## 2024-05-09 DIAGNOSIS — I5032 Chronic diastolic (congestive) heart failure: Secondary | ICD-10-CM | POA: Diagnosis not present

## 2024-05-09 DIAGNOSIS — I2510 Atherosclerotic heart disease of native coronary artery without angina pectoris: Secondary | ICD-10-CM

## 2024-05-09 DIAGNOSIS — E859 Amyloidosis, unspecified: Secondary | ICD-10-CM | POA: Diagnosis not present

## 2024-05-09 DIAGNOSIS — I4819 Other persistent atrial fibrillation: Secondary | ICD-10-CM | POA: Diagnosis not present

## 2024-05-09 MED ORDER — LORAZEPAM 1 MG PO TABS: 1.0000 mg | ORAL_TABLET | Freq: Once | ORAL | 0 refills | Status: AC

## 2024-05-09 NOTE — Assessment & Plan Note (Signed)
 History of atrial flutter ablation January 2020 A-fib ablation in August 2020 at [facility]. Recent cardioversions urgently for symptomatic A-fib at [place]  March 15, 2024. Repeat symptomatic atrial fibrillation s/p cardioversion in [place]  03/19/2024. And establish care with Dr. Inocencio and being planned for repeat relation in December. On antiarrhythmic amiodarone  started in September 2025 as bridge to ablation. Tolerating well. Remains on anticoagulation Eliquis  low-dose 2.5 mg twice daily considering his age and renal function.

## 2024-05-09 NOTE — Patient Instructions (Addendum)
 Medication Instructions:  Your physician recommends that you continue on your current medications as directed. Please refer to the Current Medication list given to you today.  *If you need a refill on your cardiac medications before your next appointment, please call your pharmacy*   Lab Work: Your physician recommends that you return for lab work in: 2 weeks prior to MRI You need to have labs done when you are fasting.  You can come Monday through Friday 8:30 am to 12:00 pm and 1:15 to 4:30. You do not need to make an appointment as the order has already been placed. The labs you are going to have done are CBC    Testing/Procedures:   You are scheduled for Cardiac MRI at the location below.     [facility] [address] [place], [place] [address] Please take advantage of the free valet parking available at the [facility] and [employer] (Entrance C).  Proceed to the [facility] Radiology Department (First Floor) for check-in.      Magnetic resonance imaging (MRI) is a painless test that produces images of the inside of the body without using Xrays.  During an MRI, strong magnets and radio waves work together in a Data processing manager to form detailed images.   MRI images may provide more details about a medical condition than X-rays, CT scans, and ultrasounds can provide.  You may be given earphones to listen for instructions.  You may eat a light breakfast and take medications as ordered with the exception of furosemide , hydrochlorothiazide , chlorthalidone  or spironolactone (or any other fluid pill). If you are undergoing a stress MRI, please avoid stimulants for 12 hr prior to test. (I.e. Caffeine, nicotine, chocolate, or antihistamine medications)  If your provider has ordered anti-anxiety medications for this test, then you will need a driver.  An IV will be inserted into one of your veins. Contrast material will be injected into your IV. It will leave your body  through your urine within a day. You may be told to drink plenty of fluids to help flush the contrast material out of your system.  You will be asked to remove all metal, including: Watch, jewelry, and other metal objects including hearing aids, hair pieces and dentures. Also wearable glucose monitoring systems (ie. Freestyle Libre and Omnipods) (Braces and fillings normally are not a problem.)   TEST WILL TAKE APPROXIMATELY 1 HOUR  PLEASE NOTIFY SCHEDULING AT LEAST 24 HOURS IN ADVANCE IF YOU ARE UNABLE TO KEEP YOUR APPOINTMENT. [phone]  For more information and frequently asked questions, please visit our website : [email or web address]  Please call the Cardiac Imaging Nurse Navigators with any questions/concerns. [phone] Office     Follow-Up: At [facility], you and your health needs are our priority.  As part of our continuing mission to provide you with exceptional heart care, we have created designated Provider Care Teams.  These Care Teams include your primary Cardiologist (physician) and Advanced Practice Providers (APPs -  Physician Assistants and Nurse Practitioners) who all work together to provide you with the care you need, when you need it.  We recommend signing up for the patient portal called MyChart.  Sign up information is provided on this After Visit Summary.  MyChart is used to connect with patients for Virtual Visits (Telemedicine).  Patients are able to view lab/test results, encounter notes, upcoming appointments, etc.  Non-urgent messages can be sent to your provider as well.   To learn more about what you can do  with MyChart, go to [email or web address].    Your next appointment:   6 month(s)  The format for your next appointment:   In Person  Provider:   Alean Kobus, MD   Other Instructions NA

## 2024-05-09 NOTE — Progress Notes (Signed)
 Cardiology Consultation:    Date:  05/09/2024   ID:  Jack Farmer, DOB [birth year], MRN [id number]  PCP:  Trinidad Glisson, MD  Cardiologist:  Alean SAUNDERS Cydni Reddoch, MD   Referring MD: Trinidad Glisson, MD   No chief complaint on file.    ASSESSMENT AND PLAN:   Jack Farmer 83 year old male history of moderate nonobstructive coronary artery disease [initial cardiac cath 2008; repeat cardiac cath September 2019], nondiagnostic amyloid screen scan from January 2020, atrial flutter/fibrillation s/p ablation in January 2020 and repeat ablation August 2020 at [facility], recently underwent cardioversions on July 31 [in [place] ] and August 4 [in [place] ], hypertension, diabetes mellitus, hyperlipidemia, former tobacco use. SPEP and UPEP completed 04/06/2024 was unremarkable and showed no evidence of monoclonal protein    Problem List Items Addressed This Visit     Persistent atrial fibrillation [facility])   History of atrial flutter ablation January 2020 A-fib ablation in August 2020 at [facility]. Recent cardioversions urgently for symptomatic A-fib at [place]  March 15, 2024. Repeat symptomatic atrial fibrillation s/p cardioversion in [place]  03/19/2024. And establish care with Dr. Inocencio and being planned for repeat relation in December. On antiarrhythmic amiodarone  started in September 2025 as bridge to ablation. Tolerating well. Remains on anticoagulation Eliquis  low-dose 2.5 mg twice daily considering his age and renal function.       CAD (coronary artery disease) - Primary   Remains asymptomatic. On anticoagulation with Eliquis . Continue Lipitor and Zetia . Continue diltiazem 120 mg once daily and metoprolol  tartrate 75 mg twice daily.       Relevant Orders   EKG 12-Lead (Completed)   Chronic diastolic heart failure (HCC)   Echocardiogram April 2024 with LVEF 60 to 65%, grade 1 diastolic dysfunction, Compensated and euvolemic. Last echocardiogram to review Continue  furosemide  80 mg in the morning and 40 mg in the afternoon. Salt restriction below 2 g/day.  Continue Farxiga 10 mg once daily Losartan  50 mg once daily Metoprolol  tartrate 75 mg twice daily Diltiazem 120 mg once daily.  There was discussion and possibility of amyloidosis raised. Prior workup with cardiac amyloid scan was nondiagnostic. Recent SPEP and UPEP completed 04/06/2024 were unremarkable and showed no evidence of monoclonal protein. Discussed further evaluation for amyloidosis assessment. Will proceed with cardiac MRI, his renal function and appears to be at baseline  Creatinine 1.77, eGFR 37 on blood work 03/22/2024      Other Visit Diagnoses       Amyloidosis, unspecified type (HCC)       Relevant Orders   Jack CARDIAC MORPHOLOGY W WO CONTRAST   CBC      Return to clinic tentatively in 6 months.   History of Present Illness:    Jack Farmer is a 83 y.o. male who is being seen today for follow-up visit. PCP is Trinidad Glisson, MD. Last visit with me in the office was 02/03/2024. Followed up with Dr. Inocencio on 05/01/2024.  Pleasant man here with his visit today accompanied by his wife.  Has history of moderate nonobstructive coronary artery disease [initial cardiac cath 2008; repeat cardiac cath September 2019], nondiagnostic amyloid screen scan from January 2020, atrial flutter/fibrillation s/p ablation in January 2020 and repeat ablation August 2020 at [facility], recently underwent cardioversions on July 31 [in [place] ] and August 4 [in [place] ], hypertension, diabetes mellitus, hyperlipidemia, former tobacco use. SPEP and UPEP completed 04/06/2024 was unremarkable and showed no evidence of monoclonal protein  Mentions overall he has been doing well since his  rhythm has settled down in sinus and his heart rates have improved. Amiodarone  that was started by Dr. Inocencio on 05/01/2024 is currently taking 2 times a day.  Does not feel his heart rates get  elevated as much as they were before. Has minor headache has side effect since starting amiodarone  but has noted this to have improved significantly.  Denies any palpitations, lightheadedness,dizziness worse syncopal episodes. Denies any blood in urine or stools.  Good compliance with his medications.   EKG in the clinic today shows sinus rhythm heart rate 67/min, PR interval 184 ms, QTc 464 ms.  Nonspecific ST-T changes lateral precordial leads.  Past Medical History:  Diagnosis Date   Abnormal electrocardiogram (ECG) (EKG) 04/11/2017   SEPT 2018      Nuclear stress EF: 51%. Consider echo to further define wall motion.    Inferior, inferolateral defect consistent with probable soft tissue attenuation. No evidence of ischemia    This is a low risk study.     Arthritis    Atherosclerosis of aorta 06/25/2013   BPH (benign prostatic hyperplasia) 10/17/2014   CAD (coronary artery disease) 05/03/2018   moderate CAD, normal LVF- medical rx   CAD (coronary artery disease), native coronary artery 12/26/2008   Cath 2008- medical Rx.  Cath for chest pain 05/03/18-  30-40% Dx1, 60-70% OM2, 50-60% RCA  Normal LVF- normal LVEDP- medical Rx     Chronic anticoagulation 03/14/2020   Warfarin per PCP (cost) as of July 2021- CHADS VASC=4     Chronic diastolic heart failure (HCC) 02/03/2024   Cough 07/20/2018   CRI (chronic renal insufficiency), stage 3 (moderate) 06/06/2018   SCr 1.79 July 2020- PCP following     Eczema 04/11/2017   Fatigue 06/06/2018   Pt's main complaint is exertional fatigue and dyspnea.      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        PAF (paroxysmal atrial fibrillation) (HCC) 03/14/2020   Atrial flutter Jan 2020- admitted to [facility].  Rx'd with DCCV and RFA.     Persistent atrial fibrillation (HCC) 03/14/2020   Atrial flutter Jan 2020- admitted to [facility].  Rx'd with DCCV and RFA.     Pre-operative clearance 07/30/2020   Primary insomnia 11/22/2016   Routine general medical examination at a health care facility 08/29/2011   Skin cancer    removed from face   Tobacco abuse 10/26/2017   Trochanteric bursitis of both hips 10/26/2017   Type 2 diabetes mellitus with complication, without long-term current use of insulin (HCC) 10/26/2017   Estimated Creatinine Clearance: 54.6 mL/min (A) (by C-G formula based on SCr of 1.56 mg/dL (H)).     Viral URI with cough 07/20/2018    Past Surgical History:  Procedure Laterality Date   ANTERIOR LAT LUMBAR FUSION  04/07/2012   Procedure: ANTERIOR LATERAL LUMBAR FUSION 2 LEVELS;  Surgeon: Alm  GORMAN Molt, MD;  Location: [facility] NEURO ORS;  Service: Neurosurgery;  Laterality: Right;  Lumbar three-four and four-five extreme lumbar interbody fusion   BACK SURGERY     CARDIAC CATHETERIZATION     COLONOSCOPY W/ POLYPECTOMY     EYE SURGERY     tightened muscles in right eye   LEFT HEART CATH AND CORONARY ANGIOGRAPHY N/A 05/03/2018   Procedure: LEFT HEART CATH AND CORONARY ANGIOGRAPHY;  Surgeon: Claudene Victory ORN, MD;  Location: [facility] INVASIVE CV LAB;  Service: Cardiovascular;  Laterality: N/A;   TONSILLECTOMY      Current Medications: Current Meds  Medication Sig   amiodarone  (PACERONE ) 200 MG tablet START Amiodarone  - take 2 tablets (400 mg total) TWICE a day for 2 weeks, then - take 1 tablet (200 mg total) TWICE a day for 2 weeks, then - take 1 tablet (200 mg total) ONCE a day thereafter   atorvastatin  (LIPITOR) 80 MG tablet Take 80 mg by mouth daily.   Continuous  Glucose Sensor (FREESTYLE LIBRE 2 SENSOR) MISC use 1 sensor every 14 days   dapagliflozin propanediol (FARXIGA) 10 MG TABS tablet Take 1 tablet by mouth daily.   diltiazem (CARDIZEM LA) 120 MG 24 hr tablet Take 120 mg by mouth daily.   docusate sodium  (COLACE) 100 MG capsule Take 100 mg by mouth 2 (two) times daily.   ELIQUIS  2.5 MG TABS tablet Take 1 tablet (2.5 mg total) by mouth 2 (two) times daily.   ezetimibe  (ZETIA ) 10 MG tablet Take 10 mg by mouth daily.   Flaxseed, Linseed, (FLAXSEED OIL PO) Take 1 Scoop by mouth daily.   furosemide  (LASIX ) 40 MG tablet Take 80 mg by mouth in the morning. 80 mg in am and 40 mg at suppertime   Lactobacillus (PROBIOTIC ACIDOPHILUS PO) Take 1 capsule by mouth daily.   losartan  (COZAAR ) 50 MG tablet Take 50 mg by mouth daily.   Metoprolol  Tartrate 75 MG TABS Take 75 mg by mouth in the morning and at bedtime.   Multiple Vitamins-Minerals (ONE-A-DAY MENS 50+ PO) Take 1 tablet by mouth daily.   multivitamin-lutein (OCUVITE-LUTEIN) CAPS capsule Take 1 capsule by mouth daily.   nitroGLYCERIN  (NITROSTAT ) 0.4 MG SL tablet Place 1 tablet (0.4 mg total) under the tongue every 5 (five) minutes x 3 doses as needed for chest pain.   omega-3 acid ethyl esters (LOVAZA ) 1 g capsule Take 1 capsule (1 g total) by mouth 2 (two) times daily.   Omega-3 Fatty Acids (OMEGA 3 PO) Take 1 capsule by mouth daily.   OVER THE COUNTER MEDICATION Balance of nature whole produce veggies 3 capsules in the AM   OVER THE COUNTER MEDICATION Balance of nature whole produce fruits 3 capsules in the AM   polyethylene glycol (MIRALAX  / GLYCOLAX ) 17 g packet Take 17 g by mouth daily. 1/2 cap full in the AM   Semaglutide ,0.25 or 0.5MG /DOS, (OZEMPIC , 0.25 OR 0.5 MG/DOSE,) 2 MG/1.5ML SOPN Inject 0.5 mg into the skin once a week.     Allergies:   Cephalexin and Indocin [indomethacin]   Social History   Socioeconomic History   Marital status: Married    Spouse name: Not on file   Number of  children: 4   Years of education: Not on file   Highest education level: Not on file  Occupational History   Not on file  Tobacco Use   Smoking status: Former    Current packs/day: 0.00    Types: Cigarettes    Quit  date: 08/16/2006    Years since quitting: 17.7   Smokeless tobacco: Never  Substance and Sexual Activity   Alcohol  use: Yes    Comment: Rare   Drug use: No   Sexual activity: Yes  Other Topics Concern   Not on file  Social History Narrative   Not on file   Social Drivers of Health   Financial Resource Strain: Not on file  Food Insecurity: Not on file  Transportation Needs: Not on file  Physical Activity: Not on file  Stress: Not on file  Social Connections: Not on file     Family History: The patient's family history includes Alcohol  abuse in an other family member; Arthritis in an other family member; Drug abuse in an other family member; Hypertension in an other family member. There is no history of Diabetes. ROS:   Please see the history of present illness.    All 14 point review of systems negative except as described per history of present illness.  EKGs/Labs/Other Studies Reviewed:    The following studies were reviewed today:   EKG:  EKG Interpretation Date/Time:  Wednesday May 09 2024 09:56:30 EDT Ventricular Rate:  67 PR Interval:  184 QRS Duration:  104 QT Interval:  440 QTC Calculation: 464 R Axis:   -20  Text Interpretation: Normal sinus rhythm Nonspecific T wave abnormality Prolonged QT Abnormal ECG When compared with ECG of 01-May-2024 15:42, T wave inversion more evident in Lateral leads Confirmed by Liborio Hai reddy [phone]) on 05/09/2024 10:20:39 AM    Recent Labs: No results found for requested labs within last 365 days.  Recent Lipid Panel    Component Value Date/Time   CHOL 104 02/02/2021 0814   TRIG 313 (H) 02/02/2021 0814   HDL 25 (L) 02/02/2021 0814   CHOLHDL 4.2 02/02/2021 0814   CHOLHDL 3.7 05/03/2018 0235    VLDL 35 05/03/2018 0235   LDLCALC 32 02/02/2021 0814   LDLDIRECT 46.0 10/26/2017 1532    Physical Exam:    VS:  BP 128/74   Pulse 67   Ht 6' 2 (1.88 m)   Wt 244 lb 9.6 oz (110.9 kg)   SpO2 95%   BMI 31.40 kg/m     Wt Readings from Last 3 Encounters:  05/09/24 244 lb 9.6 oz (110.9 kg)  05/01/24 243 lb (110.2 kg)  02/03/24 248 lb 6.4 oz (112.7 kg)     GENERAL:  Well nourished, well developed in no acute distress NECK: No JVD; No carotid bruits CARDIAC: RRR, S1 and S2 present, no murmurs, no rubs, no gallops CHEST:  Clear to auscultation without rales, wheezing or rhonchi  Extremities: No pitting pedal edema. Pulses bilaterally symmetric with radial 2+ and dorsalis pedis 2+ NEUROLOGIC:  Alert and oriented x 3  Medication Adjustments/Labs and Tests Ordered: Current medicines are reviewed at length with the patient today.  Concerns regarding medicines are outlined above.  Orders Placed This Encounter  Procedures   Jack CARDIAC MORPHOLOGY W WO CONTRAST   CBC   EKG 12-Lead   No orders of the defined types were placed in this encounter.   Signed, Hai jess Liborio, MD, MPH, [facility]. 05/09/2024 10:47 AM     [facility]

## 2024-05-09 NOTE — Addendum Note (Signed)
 Addended by: ARLOA PLANAS D on: 05/09/2024 10:56 AM   Modules accepted: Orders

## 2024-05-09 NOTE — Assessment & Plan Note (Addendum)
 Echocardiogram April 2024 with LVEF 60 to 65%, grade 1 diastolic dysfunction, Compensated and euvolemic. Last echocardiogram to review Continue furosemide  80 mg in the morning and 40 mg in the afternoon. Salt restriction below 2 g/day.  Continue Farxiga 10 mg once daily Losartan  50 mg once daily Metoprolol  tartrate 75 mg twice daily Diltiazem 120 mg once daily.  There was discussion and possibility of amyloidosis raised. Prior workup with cardiac amyloid scan was nondiagnostic. Recent SPEP and UPEP completed 04/06/2024 were unremarkable and showed no evidence of monoclonal protein. Discussed further evaluation for amyloidosis assessment. Will proceed with cardiac MRI, his renal function and appears to be at baseline  Creatinine 1.77, eGFR 37 on blood work 03/22/2024

## 2024-05-09 NOTE — Assessment & Plan Note (Signed)
 Remains asymptomatic. On anticoagulation with Eliquis . Continue Lipitor and Zetia . Continue diltiazem 120 mg once daily and metoprolol  tartrate 75 mg twice daily.

## 2024-05-15 DIAGNOSIS — H26491 Other secondary cataract, right eye: Secondary | ICD-10-CM | POA: Diagnosis not present

## 2024-05-16 DIAGNOSIS — E1122 Type 2 diabetes mellitus with diabetic chronic kidney disease: Secondary | ICD-10-CM | POA: Diagnosis not present

## 2024-05-16 DIAGNOSIS — I482 Chronic atrial fibrillation, unspecified: Secondary | ICD-10-CM | POA: Diagnosis not present

## 2024-05-30 ENCOUNTER — Encounter (HOSPITAL_COMMUNITY): Payer: Self-pay

## 2024-06-01 ENCOUNTER — Other Ambulatory Visit: Payer: Self-pay

## 2024-06-01 ENCOUNTER — Ambulatory Visit (HOSPITAL_COMMUNITY)
Admission: RE | Admit: 2024-06-01 | Discharge: 2024-06-01 | Disposition: A | Discharge status: Home / Self Care | Source: Ambulatory Visit

## 2024-06-01 DIAGNOSIS — E859 Amyloidosis, unspecified: Secondary | ICD-10-CM | POA: Insufficient documentation

## 2024-06-01 MED ORDER — GADOBUTROL 1 MMOL/ML IV SOLN
10.0000 mL | Freq: Once | INTRAVENOUS | Status: AC | PRN
  Administered 2024-06-01: 10 mL via INTRAVENOUS

## 2024-06-16 DIAGNOSIS — E1122 Type 2 diabetes mellitus with diabetic chronic kidney disease: Secondary | ICD-10-CM | POA: Diagnosis not present

## 2024-06-16 DIAGNOSIS — I482 Chronic atrial fibrillation, unspecified: Secondary | ICD-10-CM | POA: Diagnosis not present

## 2024-06-19 DIAGNOSIS — E785 Hyperlipidemia, unspecified: Secondary | ICD-10-CM | POA: Diagnosis not present

## 2024-06-19 DIAGNOSIS — E11649 Type 2 diabetes mellitus with hypoglycemia without coma: Secondary | ICD-10-CM | POA: Diagnosis not present

## 2024-06-27 DIAGNOSIS — Z6833 Body mass index (BMI) 33.0-33.9, adult: Secondary | ICD-10-CM | POA: Diagnosis not present

## 2024-06-27 DIAGNOSIS — Z23 Encounter for immunization: Secondary | ICD-10-CM | POA: Diagnosis not present

## 2024-06-27 DIAGNOSIS — N1832 Chronic kidney disease, stage 3b: Secondary | ICD-10-CM | POA: Diagnosis not present

## 2024-06-27 DIAGNOSIS — I482 Chronic atrial fibrillation, unspecified: Secondary | ICD-10-CM | POA: Diagnosis not present

## 2024-06-27 DIAGNOSIS — E1122 Type 2 diabetes mellitus with diabetic chronic kidney disease: Secondary | ICD-10-CM | POA: Diagnosis not present

## 2024-06-29 DIAGNOSIS — E859 Amyloidosis, unspecified: Secondary | ICD-10-CM | POA: Diagnosis not present

## 2024-06-30 LAB — CBC
Hematocrit: 50.1 % (ref 37.5–51.0)
Hemoglobin: 17.1 g/dL (ref 13.0–17.7)
MCH: 32.1 pg (ref 26.6–33.0)
MCHC: 34.1 g/dL (ref 31.5–35.7)
MCV: 94 fL (ref 79–97)
Platelets: 149 x10E3/uL — ABNORMAL LOW (ref 150–450)
RBC: 5.32 x10E6/uL (ref 4.14–5.80)
RDW: 12.8 % (ref 11.6–15.4)
WBC: 4.9 x10E3/uL (ref 3.4–10.8)

## 2024-07-03 ENCOUNTER — Ambulatory Visit (HOSPITAL_COMMUNITY)
Admission: RE | Admit: 2024-07-03 | Discharge: 2024-07-03 | Disposition: A | Discharge status: Home / Self Care | Source: Ambulatory Visit | Attending: Cardiology | Admitting: Cardiology

## 2024-07-03 ENCOUNTER — Ambulatory Visit: Payer: Self-pay | Admitting: Cardiology

## 2024-07-03 DIAGNOSIS — I4819 Other persistent atrial fibrillation: Secondary | ICD-10-CM | POA: Diagnosis not present

## 2024-07-03 LAB — POCT I-STAT CREATININE: Creatinine, Ser: 2 mg/dL — ABNORMAL HIGH (ref 0.61–1.24)

## 2024-07-03 MED ORDER — IOHEXOL 350 MG/ML SOLN
100.0000 mL | Freq: Once | INTRAVENOUS | Status: AC | PRN
  Administered 2024-07-03: 100 mL via INTRAVENOUS

## 2024-07-04 NOTE — Telephone Encounter (Signed)
-----   Message from Nurse Sherri P sent at 05/08/2024 11:11 AM EDT ----- Regarding: 07/27/24  AFib Ablation Precert:  MD: Camnitz Type of ablation: A-fib Diagnosis: tachycardia CPT code: A-fib (06343) Ablation scheduled (date/time): 07/27/24  10:00 am  Procedure:  Added to calendar? Yes Orders entered? Yes Letter complete? No, >30 days before procedure Scheduled with cath lab? Yes Any medications to hold? Yes (please list hold instructions): Routine - Farxiga, Lasix , Semiglutide Labs ordered (CBC, BMET, PT/INR if on warfarin): Yes Mapping system: CARTO (lab 4 or 6) CARTO/OPAL rep notified? No Cardiac CT needed? Yes, ordered Dye allergy? No Pre-meds ordered and instructions given? N/a Letter method: MyChart H&P: 9/16 Device: No  Follow-up:  Cassie/Angel, please schedule Routine.

## 2024-07-05 DIAGNOSIS — H01006 Unspecified blepharitis left eye, unspecified eyelid: Secondary | ICD-10-CM | POA: Diagnosis not present

## 2024-07-05 DIAGNOSIS — H00019 Hordeolum externum unspecified eye, unspecified eyelid: Secondary | ICD-10-CM | POA: Diagnosis not present

## 2024-07-05 DIAGNOSIS — Z6833 Body mass index (BMI) 33.0-33.9, adult: Secondary | ICD-10-CM | POA: Diagnosis not present

## 2024-07-06 ENCOUNTER — Encounter (HOSPITAL_COMMUNITY): Payer: Self-pay

## 2024-07-06 ENCOUNTER — Telehealth (HOSPITAL_COMMUNITY): Payer: Self-pay

## 2024-07-06 NOTE — Telephone Encounter (Signed)
 Spoke with patient to complete pre-procedure call.     Health status review:  Any new medical conditions, recent signs of acute illness or been started on antibiotics? Yes; Reports having a left eye infection requiring Erythromycin eye ointment until 11/26 and follow up PRN. Will contact office if problem persists.  Any recent hospitalizations or surgeries? No  Medication Instructions:   Continue taking Eliquis  (Apixaban ) twice daily without missing any doses before procedure. Essential chronic medications:  No medication should be continued, unless told otherwise.  HOLD: Semaglutide  (Ozempic , Rybelsus, Wegovy) for 1 week prior to the procedure. Last dose on Sunday, November 30.  HOLD: Dapagliflozin Pauletta) for 3 days prior to the procedure. Last dose on Monday, December 08. On the morning of your procedure DO NOT take any medication., including Eliquis  (Apixaban ).  Nothing to eat or drink after midnight prior to your procedure.  Pre-procedure testing scheduled: CT completed and lab work on November 28.  Confirmed patient is scheduled for Atrial Fibrillation Ablation on Friday, December 12 with Dr. Inocencio. Instructed patient to arrive at the Main Entrance A at [facility]: [address] [place], [place] [address] and check in at Admitting at 7:30 AM.  Plan to go home the same day, you will only stay overnight if medically necessary. You MUST have a responsible adult to drive you home and MUST be with you the first 24 hours after you arrive home or your procedure could be cancelled.  Informed a nurse may call a day before the procedure to confirm arrival time and ensure instructions are followed.  Patient verbalized understanding to information provided and is agreeable to proceed with procedure.   Advised to contact RN Navigator at [phone], to inform of any new medications started after call or concerns prior to procedure.

## 2024-07-11 ENCOUNTER — Ambulatory Visit: Payer: Self-pay

## 2024-07-11 ENCOUNTER — Ambulatory Visit: Payer: Self-pay | Admitting: Cardiology

## 2024-07-16 DIAGNOSIS — I482 Chronic atrial fibrillation, unspecified: Secondary | ICD-10-CM | POA: Diagnosis not present

## 2024-07-16 DIAGNOSIS — E1122 Type 2 diabetes mellitus with diabetic chronic kidney disease: Secondary | ICD-10-CM | POA: Diagnosis not present

## 2024-07-20 DIAGNOSIS — Z01812 Encounter for preprocedural laboratory examination: Secondary | ICD-10-CM | POA: Diagnosis not present

## 2024-07-20 DIAGNOSIS — I4819 Other persistent atrial fibrillation: Secondary | ICD-10-CM | POA: Diagnosis not present

## 2024-07-20 LAB — BASIC METABOLIC PANEL WITH GFR
BUN/Creatinine Ratio: 15 (ref 10–24)
BUN: 32 mg/dL — ABNORMAL HIGH (ref 8–27)
CO2: 24 mmol/L (ref 20–29)
Calcium: 9.2 mg/dL (ref 8.6–10.2)
Chloride: 102 mmol/L (ref 96–106)
Creatinine, Ser: 2.13 mg/dL — ABNORMAL HIGH (ref 0.76–1.27)
Glucose: 155 mg/dL — ABNORMAL HIGH (ref 70–99)
Potassium: 3.8 mmol/L (ref 3.5–5.2)
Sodium: 143 mmol/L (ref 134–144)
eGFR: 30 mL/min/1.73 — ABNORMAL LOW (ref >59)

## 2024-07-20 LAB — CBC
Hematocrit: 50 % (ref 37.5–51.0)
Hemoglobin: 16.8 g/dL (ref 13.0–17.7)
MCH: 31.9 pg (ref 26.6–33.0)
MCHC: 33.6 g/dL (ref 31.5–35.7)
MCV: 95 fL (ref 79–97)
Platelets: 148 x10E3/uL — ABNORMAL LOW (ref 150–450)
RBC: 5.26 x10E6/uL (ref 4.14–5.80)
RDW: 13 % (ref 11.6–15.4)
WBC: 5.3 x10E3/uL (ref 3.4–10.8)

## 2024-07-24 NOTE — Telephone Encounter (Signed)
 Pre-procedure lab work shows increased Cr level of 2.13 and GFR 30. Pt has CKD, stage 3. Will forward results to PCP.

## 2024-07-26 NOTE — Pre-Procedure Instructions (Signed)
 Instructed patient on the following items: Arrival time 0730 Nothing to eat or drink after midnight No meds AM of procedure Responsible person to drive you home and stay with you for 24 hrs  Have you missed any doses of anti-coagulant Eliquis - takes twice a day, hasn't missed any doses in last 4 weeks.  Don't take dose morning of procedure.

## 2024-07-27 ENCOUNTER — Ambulatory Visit (HOSPITAL_COMMUNITY): Admitting: Anesthesiology

## 2024-07-27 ENCOUNTER — Ambulatory Visit (HOSPITAL_COMMUNITY): Admission: RE | Disposition: A | Payer: Self-pay | Source: Home / Self Care | Attending: Cardiology

## 2024-07-27 ENCOUNTER — Other Ambulatory Visit: Payer: Self-pay

## 2024-07-27 ENCOUNTER — Ambulatory Visit (HOSPITAL_COMMUNITY)
Admission: RE | Admit: 2024-07-27 | Discharge: 2024-07-27 | Disposition: A | Attending: Cardiology | Admitting: Cardiology

## 2024-07-27 DIAGNOSIS — I4819 Other persistent atrial fibrillation: Secondary | ICD-10-CM | POA: Diagnosis not present

## 2024-07-27 DIAGNOSIS — I483 Typical atrial flutter: Secondary | ICD-10-CM | POA: Diagnosis not present

## 2024-07-27 DIAGNOSIS — I2510 Atherosclerotic heart disease of native coronary artery without angina pectoris: Secondary | ICD-10-CM | POA: Insufficient documentation

## 2024-07-27 DIAGNOSIS — E119 Type 2 diabetes mellitus without complications: Secondary | ICD-10-CM | POA: Insufficient documentation

## 2024-07-27 DIAGNOSIS — K219 Gastro-esophageal reflux disease without esophagitis: Secondary | ICD-10-CM | POA: Insufficient documentation

## 2024-07-27 DIAGNOSIS — I110 Hypertensive heart disease with heart failure: Secondary | ICD-10-CM | POA: Insufficient documentation

## 2024-07-27 DIAGNOSIS — I5032 Chronic diastolic (congestive) heart failure: Secondary | ICD-10-CM | POA: Insufficient documentation

## 2024-07-27 DIAGNOSIS — Z7901 Long term (current) use of anticoagulants: Secondary | ICD-10-CM | POA: Insufficient documentation

## 2024-07-27 HISTORY — PX: ATRIAL FIBRILLATION ABLATION: EP1191

## 2024-07-27 LAB — GLUCOSE, CAPILLARY
Glucose-Capillary: 148 mg/dL — ABNORMAL HIGH (ref 70–99)
Glucose-Capillary: 161 mg/dL — ABNORMAL HIGH (ref 70–99)

## 2024-07-27 LAB — POCT ACTIVATED CLOTTING TIME: Activated Clotting Time: 358 s

## 2024-07-27 MED ORDER — METOPROLOL SUCCINATE ER 100 MG PO TB24: 100.0000 mg | ORAL_TABLET | Freq: Every day | ORAL | 3 refills | Status: AC

## 2024-07-27 MED ORDER — EPHEDRINE SULFATE-NACL 50-0.9 MG/10ML-% IV SOSY
PREFILLED_SYRINGE | INTRAVENOUS | Status: DC | PRN
  Administered 2024-07-27: 5 mg via INTRAVENOUS

## 2024-07-27 MED ORDER — FENTANYL CITRATE (PF) 100 MCG/2ML IJ SOLN
INTRAMUSCULAR | Status: AC
  Filled 2024-07-27: qty 2

## 2024-07-27 MED ORDER — HEPARIN (PORCINE) IN NACL 1000-0.9 UT/500ML-% IV SOLN
INTRAVENOUS | Status: DC | PRN
  Administered 2024-07-27 (×2): 500 mL

## 2024-07-27 MED ORDER — PROTAMINE SULFATE 10 MG/ML IV SOLN
INTRAVENOUS | Status: DC | PRN
  Administered 2024-07-27: 40 mg via INTRAVENOUS

## 2024-07-27 MED ORDER — FENTANYL CITRATE (PF) 250 MCG/5ML IJ SOLN
INTRAMUSCULAR | Status: DC | PRN
  Administered 2024-07-27: 10:00:00 100 ug via INTRAVENOUS

## 2024-07-27 MED ORDER — ACETAMINOPHEN 325 MG PO TABS: 650.0000 mg | ORAL_TABLET | ORAL | Status: DC | PRN

## 2024-07-27 MED ORDER — LIDOCAINE 2% (20 MG/ML) 5 ML SYRINGE
INTRAMUSCULAR | Status: DC | PRN
  Administered 2024-07-27: 60 mg via INTRAVENOUS

## 2024-07-27 MED ORDER — PROPOFOL 500 MG/50ML IV EMUL
INTRAVENOUS | Status: DC | PRN
  Administered 2024-07-27: 175 ug/kg/min via INTRAVENOUS

## 2024-07-27 MED ORDER — OXYCODONE HCL 5 MG PO TABS
ORAL_TABLET | ORAL | Status: AC
  Administered 2024-07-27: 5 mg
  Filled 2024-07-27: qty 1

## 2024-07-27 MED ORDER — SUCCINYLCHOLINE CHLORIDE 200 MG/10ML IV SOSY
PREFILLED_SYRINGE | INTRAVENOUS | Status: DC | PRN
  Administered 2024-07-27: 160 mg via INTRAVENOUS

## 2024-07-27 MED ORDER — ONDANSETRON HCL 4 MG/2ML IJ SOLN
INTRAMUSCULAR | Status: DC | PRN
  Administered 2024-07-27: 4 mg via INTRAVENOUS

## 2024-07-27 MED ORDER — FENTANYL CITRATE (PF) 100 MCG/2ML IJ SOLN
INTRAMUSCULAR | Status: AC
  Administered 2024-07-27: 100 ug
  Filled 2024-07-27: qty 2

## 2024-07-27 MED ORDER — PHENYLEPHRINE 80 MCG/ML (10ML) SYRINGE FOR IV PUSH (FOR BLOOD PRESSURE SUPPORT)
PREFILLED_SYRINGE | INTRAVENOUS | Status: DC | PRN
  Administered 2024-07-27 (×2): 80 ug via INTRAVENOUS

## 2024-07-27 MED ORDER — DEXMEDETOMIDINE HCL IN NACL 80 MCG/20ML IV SOLN
INTRAVENOUS | Status: DC | PRN
  Administered 2024-07-27 (×2): 25 ug via INTRAVENOUS

## 2024-07-27 MED ORDER — SODIUM CHLORIDE 0.9 % IV SOLN: INTRAVENOUS | Status: DC

## 2024-07-27 MED ORDER — PROPOFOL 10 MG/ML IV BOLUS
INTRAVENOUS | Status: DC | PRN
  Administered 2024-07-27: 130 mg via INTRAVENOUS

## 2024-07-27 MED ORDER — FENTANYL CITRATE (PF) 100 MCG/2ML IJ SOLN
25.0000 ug | Freq: Once | INTRAMUSCULAR | Status: AC
  Administered 2024-07-27: 25 ug via INTRAVENOUS

## 2024-07-27 MED ORDER — DEXAMETHASONE SOD PHOSPHATE PF 10 MG/ML IJ SOLN
INTRAMUSCULAR | Status: DC | PRN
  Administered 2024-07-27: 10 mg via INTRAVENOUS

## 2024-07-27 MED ORDER — SUGAMMADEX SODIUM 200 MG/2ML IV SOLN
INTRAVENOUS | Status: DC | PRN
  Administered 2024-07-27: 200 mg via INTRAVENOUS

## 2024-07-27 MED ORDER — ROCURONIUM BROMIDE 10 MG/ML (PF) SYRINGE
PREFILLED_SYRINGE | INTRAVENOUS | Status: DC | PRN
  Administered 2024-07-27: 30 mg via INTRAVENOUS
  Administered 2024-07-27: 70 mg via INTRAVENOUS

## 2024-07-27 MED ORDER — MIDAZOLAM HCL 2 MG/2ML IJ SOLN
INTRAMUSCULAR | Status: AC
  Filled 2024-07-27: qty 2

## 2024-07-27 MED ORDER — PHENYLEPHRINE HCL-NACL 20-0.9 MG/250ML-% IV SOLN
INTRAVENOUS | Status: DC | PRN
  Administered 2024-07-27: 50 ug/min via INTRAVENOUS

## 2024-07-27 MED ORDER — ONDANSETRON HCL 4 MG/2ML IJ SOLN: 4.0000 mg | Freq: Four times a day (QID) | INTRAMUSCULAR | Status: DC | PRN

## 2024-07-27 MED ORDER — OXYCODONE HCL 5 MG PO TABS: 5.0000 mg | ORAL_TABLET | ORAL | Status: DC | PRN

## 2024-07-27 MED ORDER — HEPARIN SODIUM (PORCINE) 1000 UNIT/ML IJ SOLN
INTRAMUSCULAR | Status: AC
  Filled 2024-07-27: qty 10

## 2024-07-27 MED ORDER — ATROPINE SULFATE 1 MG/10ML IJ SOSY
PREFILLED_SYRINGE | INTRAMUSCULAR | Status: AC
  Filled 2024-07-27: qty 10

## 2024-07-27 MED ORDER — HEPARIN SODIUM (PORCINE) 1000 UNIT/ML IJ SOLN
INTRAMUSCULAR | Status: DC | PRN
  Administered 2024-07-27: 15000 [IU] via INTRAVENOUS

## 2024-07-27 NOTE — H&P (Signed)
°  Electrophysiology Office Note:   Date:  07/27/2024  ID:  Jack Farmer, DOB Oct 08, 1940, MRN [id number]  Primary Cardiologist: Jack SAUNDERS Madireddy, MD Primary Heart Failure: None Electrophysiologist: Jack Pemberton Gladis Norton, MD      History of Present Illness:   Jack Farmer is a 83 y.o. male with h/o nonobstructive coronary artery disease, diastolic heart failure, hypertension, atrial fibrillation, diabetes seen today for  for Electrophysiology evaluation of atrial fibrillation at the request of Jack Farmer.    Today, denies symptoms of palpitations, chest pain, dyspnea, orthopnea, PND, lower extremity edema, claudication, dizziness, presyncope, syncope, bleeding, or neurologic sequela. The patient is tolerating medications without difficulties. Plan ablation today.   EP Information / Studies Reviewed:    EKG is ordered today. Personal review as below.      Risk Assessment/Calculations:    CHA2DS2-VASc Score = 6   This indicates a 9.7% annual risk of stroke. The patient's score is based upon: CHF History: 1 HTN History: 1 Diabetes History: 1 Stroke History: 0 Vascular Disease History: 1 Age Score: 2 Gender Score: 0            Physical Exam:   VS:  There were no vitals taken for this visit.   Wt Readings from Last 3 Encounters:  05/09/24 110.9 kg  05/01/24 110.2 kg  02/03/24 112.7 kg    GEN: Well nourished, well developed in no acute distress NECK: No JVD; No carotid bruits CARDIAC: Regular rate and rhythm, no murmurs, rubs, gallops RESPIRATORY:  Clear to auscultation without rales, wheezing or rhonchi  ABDOMEN: Soft, non-tender, non-distended EXTREMITIES:  No edema; No deformity    ASSESSMENT AND PLAN:    1.  Paroxysmal atrial fibrillation/flutter: Jack Farmer has presented today for surgery, with the diagnosis of AF.  The various methods of treatment have been discussed with the patient and family. After consideration of risks, benefits  and other options for treatment, the patient has consented to  Procedure(s): Catheter ablation as a surgical intervention .  Risks include but not limited to complete heart block, stroke, esophageal damage, nerve damage, bleeding, vascular damage, tamponade, perforation, MI, and death. The patient's history has been reviewed, patient examined, no change in status, stable for surgery.  I have reviewed the patient's chart and labs.  Questions were answered to the patient's satisfaction.    Jack Macbride Norton, MD 07/27/2024 8:44 AM

## 2024-07-27 NOTE — Anesthesia Preprocedure Evaluation (Addendum)
 Anesthesia Evaluation  Patient identified by MRN, date of birth, ID band Patient awake    Reviewed: Allergy & Precautions, NPO status , Patient's Chart, lab work & pertinent test results  History of Anesthesia Complications Negative for: history of anesthetic complications  Airway Mallampati: IV  TM Distance: >3 FB Neck ROM: Full    Dental  (+) Teeth Intact, Dental Advisory Given   Pulmonary neg shortness of breath, neg sleep apnea, neg COPD, neg recent URI, former smoker   breath sounds clear to auscultation       Cardiovascular hypertension, Pt. on medications and Pt. on home beta blockers (-) angina + CAD  (-) Past MI and (-) Cardiac Stents + dysrhythmias Atrial Fibrillation  Rhythm:Regular   1. Left ventricular ejection fraction, by estimation, is 60 to 65%. The  left ventricle has normal function. The left ventricle has no regional  wall motion abnormalities. Left ventricular diastolic parameters are  consistent with Grade I diastolic  dysfunction (impaired relaxation).   2. Right ventricular systolic function is normal. The right ventricular  size is normal. There is normal pulmonary artery systolic pressure.   3. The mitral valve is normal in structure. No evidence of mitral valve  regurgitation. No evidence of mitral stenosis.   4. The aortic valve is normal in structure. Aortic valve regurgitation is  not visualized. No aortic stenosis is present.   5. The inferior vena cava is normal in size with greater than 50%  respiratory variability, suggesting right atrial pressure of 3 mmHg.     Moderate, nonobstructive three-vessel CAD  Normal left main  LAD does not wraparound the left ventricular apex.  The LAD gives origin to a large first diagonal.  The first diagonal contains mid segment 30 to 40% narrowing.  The LAD territory is otherwise widely patent.  Circumflex gives origin to 3 obtuse marginals, the second of  which is the larger of the branches.  Beyond the origin of the second marginal there is concentric 60 to 70% stenosis unchanged from angiography in 2008.  The third obtuse marginal contains segmental 50% narrowing unchanged from prior angiography.  The right coronary is dominant.  Moderate calcification is noted throughout the proximal and mid vessel.  Eccentric 50 to 60% mid stenosis is noted.  Diffuse 50 to 60% distal stenosis.  PDA contains 50 to 60% mid stenosis.  Left ventricular function is normal.  EF is estimated to be 60%.  LVEDP is upper normal at 18 mmHg.   RECOMMENDATIONS:    Aggressive risk factor modification.  Consider alternative explanations for chest discomfort and dyspnea.    Neuro/Psych neg Seizures  Neuromuscular disease  negative psych ROS   GI/Hepatic Neg liver ROS,GERD  Controlled,,  Endo/Other  diabetes    Renal/GU CRFRenal diseaseLab Results      Component                Value               Date                      NA                       143                 07/20/2024                K  3.8                 07/20/2024                CO2                      24                  07/20/2024                GLUCOSE                  155 (H)             07/20/2024                BUN                      32 (H)              07/20/2024                CREATININE               2.13 (H)            07/20/2024                CALCIUM                   9.2                 07/20/2024                GFR                      46.14 (L)           05/24/2018                EGFR                     30 (L)              07/20/2024                GFRNONAA                 42 (L)              09/30/2020                Musculoskeletal  (+) Arthritis ,    Abdominal   Peds  Hematology  (+) Blood dyscrasia Lab Results      Component                Value               Date                      WBC                      5.3                 07/20/2024                 HGB                      16.8  07/20/2024                HCT                      50.0                07/20/2024                MCV                      95                  07/20/2024                PLT                      148 (L)             07/20/2024             eliquis    Anesthesia Other Findings   Reproductive/Obstetrics                              Anesthesia Physical Anesthesia Plan  ASA: 3  Anesthesia Plan: General   Post-op Pain Management: Minimal or no pain anticipated   Induction: Intravenous  PONV Risk Score and Plan: 2 and Ondansetron , Dexamethasone , Propofol  infusion and TIVA  Airway Management Planned: Oral ETT and Video Laryngoscope Planned  Additional Equipment: None  Intra-op Plan:   Post-operative Plan: Extubation in OR  Informed Consent: I have reviewed the patients History and Physical, chart, labs and discussed the procedure including the risks, benefits and alternatives for the proposed anesthesia with the patient or authorized representative who has indicated his/her understanding and acceptance.     Dental advisory given  Plan Discussed with: CRNA  Anesthesia Plan Comments:         Anesthesia Quick Evaluation

## 2024-07-27 NOTE — Anesthesia Procedure Notes (Addendum)
 Procedure Name: Intubation Date/Time: 07/27/2024 9:57 AM  Performed by: Mollie Olivia SAUNDERS, CRNAPre-anesthesia Checklist: Patient identified, Emergency Drugs available, Suction available and Patient being monitored Patient Re-evaluated:Patient Re-evaluated prior to induction Oxygen Delivery Method: Circle system utilized Preoxygenation: Pre-oxygenation with 100% oxygen Induction Type: IV induction Ventilation: Two handed mask ventilation required Laryngoscope Size: Glidescope and 4 Grade View: Grade II Tube type: Oral Tube size: 7.5 mm Number of attempts: 1 Airway Equipment and Method: Oral airway, Rigid stylet and Video-laryngoscopy Placement Confirmation: ETT inserted through vocal cords under direct vision, positive ETCO2 and breath sounds checked- equal and bilateral Secured at: 23 cm Tube secured with: Tape Dental Injury: Teeth and Oropharynx as per pre-operative assessment  Difficulty Due To: Difficult Airway- due to reduced neck mobility, Difficult Airway- due to anterior larynx, Difficult Airway- due to large tongue, Difficult Airway- due to limited oral opening and Difficulty was anticipated Future Recommendations: Recommend- induction with short-acting agent, and alternative techniques readily available Comments: Pt had dental work last week and since he cannot open his mouth fully; only half way; his head has to be supported by 2 blankets folded in a square, or his head will hang without support.

## 2024-07-27 NOTE — Anesthesia Postprocedure Evaluation (Signed)
 Anesthesia Post Note  Patient: Jack Farmer  Procedure(s) Performed: ATRIAL FIBRILLATION ABLATION     Patient location during evaluation: PACU Anesthesia Type: General Level of consciousness: awake and alert Pain management: pain level controlled Vital Signs Assessment: post-procedure vital signs reviewed and stable Respiratory status: spontaneous breathing, nonlabored ventilation and respiratory function stable Cardiovascular status: blood pressure returned to baseline and stable Postop Assessment: no apparent nausea or vomiting Anesthetic complications: no   There were no known notable events for this encounter.  Last Vitals:  Vitals:   07/27/24 1215 07/27/24 1217  BP: (!) 124/59   Pulse: 67 69  Resp: 14 15  Temp:    SpO2: 94% 90%    Last Pain:  Vitals:   07/27/24 1210  TempSrc:   PainSc: 10-Worst pain ever                 Sayla Golonka

## 2024-07-27 NOTE — Progress Notes (Signed)
 Patient and wife was given instructions. Both verbalized understanding.

## 2024-07-27 NOTE — Transfer of Care (Signed)
 Immediate Anesthesia Transfer of Care Note  Patient: Jack Farmer  Procedure(s) Performed: ATRIAL FIBRILLATION ABLATION  Patient Location: PACU and Cath Lab  Anesthesia Type:General  Level of Consciousness: awake, alert , and oriented  Airway & Oxygen Therapy: Patient Spontanous Breathing and Patient connected to face mask oxygen  Post-op Assessment: Report given to RN, Post -op Vital signs reviewed and stable, and Patient moving all extremities X 4  Post vital signs: Reviewed and stable  Last Vitals:  Vitals Value Taken Time  BP    Temp 36.4 C 07/27/24 11:37  Pulse 63 07/27/24 11:38  Resp 13 07/27/24 11:38  SpO2 97 % 07/27/24 11:38  Vitals shown include unfiled device data.  Last Pain:  Vitals:   07/27/24 1137  TempSrc: Axillary         Complications: There were no known notable events for this encounter.

## 2024-07-27 NOTE — Discharge Instructions (Signed)

## 2024-07-27 NOTE — Progress Notes (Signed)
 Took over care for patient at 1405: at the time of transfer patient hemodynamically stable. No s/s of complication at the incision site.

## 2024-07-27 NOTE — Progress Notes (Signed)
 PT tolerate PO intake was able to ambulate to the bathroom, voided without difficulty. No s/s of complications at the incision site. PT was escorted from the unit via wheel chair to personal vehicle.

## 2024-07-28 ENCOUNTER — Encounter (HOSPITAL_COMMUNITY): Payer: Self-pay | Admitting: Cardiology

## 2024-07-30 ENCOUNTER — Telehealth (HOSPITAL_COMMUNITY): Payer: Self-pay

## 2024-07-30 NOTE — Telephone Encounter (Signed)
 Spoke with patient to complete post procedure follow up call.  Patient reports no complications with groin sites.   Instructions reviewed with patient:  Remove large bandage at puncture site after 24 hours. It is normal to have bruising, tenderness, mild swelling, and a pea or marble sized lump/knot at the groin site which can take up to three months to resolve.  Get help right away if you notice sudden swelling at the puncture site.  Check your puncture site every day for signs of infection: fever, redness, swelling, pus drainage, warmth, foul odor or excessive pain. If this occurs, please call [phone], to speak with the RN Navigator. Get help right away if your puncture site is bleeding and the bleeding does not stop after applying firm pressure to the area.  You may continue to have skipped beats/ atrial fibrillation during the first several months after your procedure.  It is very important not to miss any doses of your blood thinner Eliquis .    You will follow up with the Afib clinic 4 weeks after your procedure and follow up with Dr. Inocencio 3 months after your procedure.  Activity restrictions reviewed.  Patient verbalized understanding to all instructions provided.

## 2024-08-02 MED FILL — Atropine Sulfate Soln Prefill Syr 1 MG/10ML (0.1 MG/ML): INTRAMUSCULAR | Qty: 10 | Status: AC

## 2024-08-02 MED FILL — Fentanyl Citrate Preservative Free (PF) Inj 100 MCG/2ML: INTRAMUSCULAR | Qty: 2 | Status: AC

## 2024-08-24 NOTE — Progress Notes (Signed)
 "  Primary Care Physician: Jack Glisson, Farmer Primary Cardiologist: Jack SAUNDERS Madireddy, Farmer Electrophysiologist: Jack Gladis Norton, Farmer  Referring Physician: Soyla Gladis Norton, Farmer   Jack Farmer is a 84 y.o. male with a history of HTN, HLD, HFpEF, nonobstructive CAD, CKD stage III, who presents for follow up in the [facility].  The patient was initially diagnosed with atrial flutter in 2020 and was started on Eliquis  and underwent TEE/DCCV followed by CTI ablation in 08/2018 and PVI ablation for recurrent atrial fibrillation/flutter on 03/21/2019 at [facility]. He had recurrence of atrial flutter and CHF exacerbation and was admitted to [facility] treated with amiodarone  with conversion to sinus rhythm and 10/2022. He presented to the ED on 03/15/2024 and 8/25 with symptomatic AF with RVR both treated with DCCV x 1 to sinus rhythm.  He was seen in follow-up on 04/03/2024 by Jack Hoover, NP and was referred to EP and evaluated by Dr. Norton on 05/01/2024. During visit he was started on amiodarone  as a bridge to  pulse field ablation. He underwent ablation procedure on 07/27/2024 with plan to discontinue amiodarone  in 3 months to avoid long-term antiarrhythmic therapy.  Jack Farmer presents today with his wife for post ablation follow-up.  On exam today patient is in sinus rhythm with no groin complications since his procedure.  He has been compliant with his amiodarone  and Eliquis  without any missed doses.  He reports that he is feeling better but still has some fatigue but notes no palpitations or arrhythmia since his procedure.  We discussed the importance of compliance with his blood thinner as well as antiarrhythmic during the post 29-month period.  He is scheduled to follow-up in March and was advised to continue medications as prescribed for now.  He was also encouraged to increase physical activity as tolerated with no restrictions postprocedure.  Today, he  denies symptoms of palpitations, chest pain, shortness of breath, orthopnea, PND, lower extremity edema, dizziness, presyncope, syncope, snoring, daytime somnolence, bleeding, or neurologic sequela. The patient is tolerating medications without difficulties and is otherwise without complaint today.     Atrial Fibrillation Management history: History of Sleep Apnea Previous antiarrhythmic drugs: Amiodarone  Previous cardioversions: 02/2024, 03/2024 Previous ablations: CTI ablation 08/2018, AF ablation 03/2019 and 07/2024 Anticoagulation history: Eliquis   ROS- All systems are reviewed and negative except as per the HPI above.  Past Medical History:  Diagnosis Date   Abnormal electrocardiogram (ECG) (EKG) 04/11/2017   SEPT 2018      Nuclear stress EF: 51%. Consider echo to further define wall motion.    Inferior, inferolateral defect consistent with probable soft tissue attenuation. No evidence of ischemia    This is a low risk study.     Arthritis    Atherosclerosis of aorta 06/25/2013   BPH (benign prostatic hyperplasia) 10/17/2014   CAD (coronary artery disease) 05/03/2018   moderate CAD, normal LVF- medical rx   CAD (coronary artery disease), native coronary artery 12/26/2008   Cath 2008- medical Rx.  Cath for chest pain 05/03/18-  30-40% Dx1, 60-70% OM2, 50-60% RCA  Normal LVF- normal LVEDP- medical Rx     Chronic anticoagulation 03/14/2020   Warfarin per PCP (cost) as of July 2021- CHADS VASC=4     Chronic diastolic heart failure (HCC) 02/03/2024   Cough 07/20/2018   CRI (chronic renal insufficiency), stage 3 (moderate) 06/06/2018   SCr 1.79 July 2020- PCP following     Eczema 04/11/2017   Fatigue 06/06/2018  Pt's main complaint is exertional fatigue and dyspnea.      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        PAF (paroxysmal atrial fibrillation) (HCC) 03/14/2020   Atrial flutter Jan 2020- admitted to [facility].  Rx'd with DCCV and RFA.     Persistent atrial fibrillation (HCC) 03/14/2020   Atrial flutter Jan 2020- admitted to [facility].  Rx'd with DCCV and RFA.     Pre-operative clearance 07/30/2020   Primary insomnia 11/22/2016   Routine general medical examination at a health care facility 08/29/2011   Skin cancer    removed from face   Tobacco abuse 10/26/2017   Trochanteric bursitis of both hips 10/26/2017   Type 2 diabetes mellitus with complication, without long-term current use of insulin (HCC) 10/26/2017   Estimated Creatinine Clearance: 54.6 mL/min (A) (by C-G formula based on SCr of 1.56 mg/dL (H)).     Viral URI with cough 07/20/2018    Current Outpatient Medications  Medication Sig Dispense Refill   Ascorbic Acid (VITAMIN C) 1000 MG tablet Take 1,000 mg by mouth daily.     atorvastatin  (LIPITOR) 80 MG tablet Take 80 mg by mouth daily.     Continuous Glucose Sensor (FREESTYLE LIBRE 2 SENSOR) MISC use 1 sensor every 14 days     dapagliflozin propanediol (FARXIGA) 10 MG TABS tablet Take 10 mg by mouth daily.     diltiazem (TIAZAC) 120 MG 24 hr capsule Take 120 mg by mouth daily.     docusate sodium  (COLACE) 100 MG capsule Take 100 mg by mouth 2 (two) times daily.     ELIQUIS  2.5 MG TABS tablet Take 1 tablet (2.5 mg total) by mouth 2 (two) times daily. 60 tablet 11   Flaxseed, Linseed, (FLAXSEED OIL PO) Take 30 mLs by mouth daily.     furosemide  (LASIX ) 40 MG tablet Take 40-80 mg by mouth See admin instructions. Take 80 mg in am and 40 mg at bedtime     losartan  (COZAAR ) 50 MG tablet  Take 50 mg by mouth daily.     metoprolol  succinate (TOPROL  XL) 100 MG 24 hr tablet Take 1 tablet (100 mg total) by mouth daily. Take with or immediately following a meal. 90 tablet 3   Multiple Vitamins-Minerals (ONE-A-DAY MENS 50+ PO) Take 1 tablet by mouth daily.     multivitamin-lutein (OCUVITE-LUTEIN) CAPS capsule Take 1 capsule by mouth daily.     nitroGLYCERIN  (NITROSTAT ) 0.4 MG SL tablet Place 1 tablet (0.4 mg total) under the tongue every 5 (five) minutes x 3 doses as needed for chest pain. 25 tablet 3   omega-3 acid ethyl esters (LOVAZA ) 1 g capsule Take 1 capsule (1 g total) by mouth 2 (two) times daily. (Patient taking differently: Take 2 g by mouth 2 (two) times daily.) 180 capsule 3   polyethylene glycol (MIRALAX  / GLYCOLAX ) 17  g packet Take 17 g by mouth daily.     Semaglutide ,0.25 or 0.5MG /DOS, (OZEMPIC , 0.25 OR 0.5 MG/DOSE,) 2 MG/1.5ML SOPN Inject 0.5 mg into the skin once a week. (Patient taking differently: Inject 1.5 mg into the skin once a week.) 4.5 mL 3   amiodarone  (PACERONE ) 200 MG tablet Take 1 tablet (200 mg total) by mouth daily. 90 tablet 3   No current facility-administered medications for this encounter.    Physical Exam: BP 118/70   Pulse 75   Ht 6' 2 (1.88 m)   Wt 111 kg   BMI 31.43 kg/m   GEN: Well nourished, well developed in no acute distress NECK: No JVD; No carotid bruits CARDIAC: Regular rate and rhythm, no murmurs, rubs, gallops RESPIRATORY:  Clear to auscultation without rales, wheezing or rhonchi  ABDOMEN: Soft, non-tender, non-distended EXTREMITIES:  No edema; No deformity   Wt Readings from Last 3 Encounters:  08/27/24 111 kg  05/09/24 110.9 kg  05/01/24 110.2 kg     EKG today demonstrates:   EKG Interpretation Date/Time:  Monday August 27 2024 10:42:26 EST Ventricular Rate:  75 PR Interval:  190 QRS Duration:  100 QT Interval:  420 QTC Calculation: 469 R Axis:   -13  Text Interpretation: Normal sinus rhythm with Left axis  deviation Normal ECG When compared with ECG of 27-Jul-2024 11:52, No significant change was found Confirmed by Wyn Manus [phone]) on 08/27/2024 10:44:12 AM        Echo Completed 11/18/2022: 1. Left ventricular ejection fraction, by estimation, is 60 to 65%. The  left ventricle has normal function. The left ventricle has no regional  wall motion abnormalities. Left ventricular diastolic parameters are  consistent with Grade I diastolic  dysfunction (impaired relaxation).   2. Right ventricular systolic function is normal. The right ventricular  size is normal. There is normal pulmonary artery systolic pressure.   3. The mitral valve is normal in structure. No evidence of mitral valve  regurgitation. No evidence of mitral stenosis.   4. The aortic valve is normal in structure. Aortic valve regurgitation is  not visualized. No aortic stenosis is present.   5. The inferior vena cava is normal in size with greater than 50%  respiratory variability, suggesting right atrial pressure of 3 mmHg.   CHA2DS2-VASc Score = 6  The patient's score is based upon: CHF History: 1 HTN History: 1 Diabetes History: 1 Stroke History: 0 Vascular Disease History: 1 Age Score: 2 Gender Score: 0      ASSESSMENT AND PLAN: Paroxysmal Atrial Fibrillation (ICD10:  I48.0) The patient's CHA2DS2-VASc score is 6, indicating a 9.7% annual risk of stroke. -s/p RF ablation in 2020 with recent pulsed field ablation on 07/27/2024 by Dr. Inocencio with amiodarone  bridge. - Today patient reports that he is doing well with no groin complications and has been  compliant with his Eliquis  and amiodarone  -He was advised to increase activity as tolerated and to continue his current medications as prescribed. - Continue Eliquis  2.5 mg twice daily with reduced dose secondary to age and creatinine - Continue amiodarone  200 mg daily with plan to discontinue possibly at 19-month follow-up.  Secondary Hypercoagulable State (ICD10:   D68.69) The patient is at significant risk for stroke/thromboembolism based upon his CHA2DS2-VASc Score of 6.  Continue Apixaban  (Eliquis ).   HTN: BP well controlled. Continue current antihypertensive regimen.   CAD: -s/p LHC completed on 04/2018 showing moderate nonobstructive three-vessel disease managed medically. - Today patient reports chest pain or angina  since previous follow-up.  High Risk Medication Monitoring (ICD 10: U5195107) Patient requires ongoing monitoring for anti-arrhythmic medication which has the potential to cause life threatening arrhythmias. - EKG intervals stable for amiodarone  therapy.  Signed,  Wyn Raddle, Jackee Shove, NP    08/27/2024 11:20 AM    Follow up with the AF Clinic as Scheduled "

## 2024-08-27 ENCOUNTER — Encounter (HOSPITAL_COMMUNITY): Payer: Self-pay | Admitting: Nurse Practitioner

## 2024-08-27 ENCOUNTER — Ambulatory Visit (HOSPITAL_COMMUNITY)
Admission: RE | Admit: 2024-08-27 | Discharge: 2024-08-27 | Disposition: A | Discharge status: Home / Self Care | Source: Ambulatory Visit | Attending: Nurse Practitioner | Admitting: Nurse Practitioner

## 2024-08-27 VITALS — BP 118/70 | HR 75 | Ht 74.0 in | Wt 244.8 lb

## 2024-08-27 DIAGNOSIS — Z79899 Other long term (current) drug therapy: Secondary | ICD-10-CM

## 2024-08-27 DIAGNOSIS — I10 Essential (primary) hypertension: Secondary | ICD-10-CM

## 2024-08-27 DIAGNOSIS — D6869 Other thrombophilia: Secondary | ICD-10-CM

## 2024-08-27 DIAGNOSIS — I4819 Other persistent atrial fibrillation: Secondary | ICD-10-CM | POA: Diagnosis not present

## 2024-08-27 DIAGNOSIS — I2510 Atherosclerotic heart disease of native coronary artery without angina pectoris: Secondary | ICD-10-CM | POA: Diagnosis not present

## 2024-08-27 DIAGNOSIS — D6859 Other primary thrombophilia: Secondary | ICD-10-CM

## 2024-08-27 DIAGNOSIS — Z5181 Encounter for therapeutic drug level monitoring: Secondary | ICD-10-CM | POA: Diagnosis not present

## 2024-08-27 MED ORDER — AMIODARONE HCL 200 MG PO TABS: 200.0000 mg | ORAL_TABLET | Freq: Every day | ORAL | 3 refills | Status: AC

## 2024-10-29 ENCOUNTER — Ambulatory Visit: Admitting: Cardiology

## 2024-11-05 ENCOUNTER — Ambulatory Visit
